# Patient Record
Sex: Male | Born: 1975 | Race: Black or African American | Hispanic: No | Marital: Single | Smoking: Never smoker
Health system: Southern US, Community
[De-identification: ages and names within clinical notes are randomized; demographics above are authoritative.]

## PROBLEM LIST (undated history)

## (undated) DIAGNOSIS — F988 Other specified behavioral and emotional disorders with onset usually occurring in childhood and adolescence: Secondary | ICD-10-CM

## (undated) DIAGNOSIS — C61 Malignant neoplasm of prostate: Secondary | ICD-10-CM

## (undated) DIAGNOSIS — E785 Hyperlipidemia, unspecified: Secondary | ICD-10-CM

## (undated) DIAGNOSIS — F32A Depression, unspecified: Secondary | ICD-10-CM

## (undated) DIAGNOSIS — J45909 Unspecified asthma, uncomplicated: Secondary | ICD-10-CM

## (undated) DIAGNOSIS — F419 Anxiety disorder, unspecified: Secondary | ICD-10-CM

## (undated) DIAGNOSIS — F329 Major depressive disorder, single episode, unspecified: Secondary | ICD-10-CM

## (undated) DIAGNOSIS — J302 Other seasonal allergic rhinitis: Secondary | ICD-10-CM

## (undated) DIAGNOSIS — F259 Schizoaffective disorder, unspecified: Secondary | ICD-10-CM

## (undated) HISTORY — DX: Depression, unspecified: F32.A

## (undated) HISTORY — DX: Hyperlipidemia, unspecified: E78.5

## (undated) HISTORY — DX: Unspecified asthma, uncomplicated: J45.909

## (undated) HISTORY — PX: NO PAST SURGERIES: SHX2092

## (undated) HISTORY — DX: Anxiety disorder, unspecified: F41.9

## (undated) HISTORY — DX: Other seasonal allergic rhinitis: J30.2

## (undated) HISTORY — DX: Major depressive disorder, single episode, unspecified: F32.9

## (undated) HISTORY — DX: Other specified behavioral and emotional disorders with onset usually occurring in childhood and adolescence: F98.8

## (undated) HISTORY — DX: Schizoaffective disorder, unspecified: F25.9

## (undated) HISTORY — DX: Malignant neoplasm of prostate: C61

---

## 2004-04-08 ENCOUNTER — Ambulatory Visit: Payer: Self-pay | Admitting: Internal Medicine

## 2004-05-09 ENCOUNTER — Ambulatory Visit: Payer: Self-pay | Admitting: Internal Medicine

## 2004-07-15 ENCOUNTER — Emergency Department: Payer: Self-pay | Admitting: Emergency Medicine

## 2004-12-06 ENCOUNTER — Ambulatory Visit: Payer: Self-pay | Admitting: Internal Medicine

## 2004-12-20 ENCOUNTER — Ambulatory Visit: Payer: Self-pay | Admitting: Internal Medicine

## 2005-01-07 ENCOUNTER — Ambulatory Visit: Payer: Self-pay | Admitting: Internal Medicine

## 2005-02-28 ENCOUNTER — Ambulatory Visit: Payer: Self-pay | Admitting: Internal Medicine

## 2005-03-09 ENCOUNTER — Ambulatory Visit: Payer: Self-pay | Admitting: Internal Medicine

## 2005-04-08 ENCOUNTER — Ambulatory Visit: Payer: Self-pay | Admitting: Internal Medicine

## 2005-05-09 ENCOUNTER — Ambulatory Visit: Payer: Self-pay | Admitting: Internal Medicine

## 2005-06-09 ENCOUNTER — Ambulatory Visit: Payer: Self-pay | Admitting: Internal Medicine

## 2005-07-07 ENCOUNTER — Ambulatory Visit: Payer: Self-pay | Admitting: Internal Medicine

## 2005-08-07 ENCOUNTER — Ambulatory Visit: Payer: Self-pay | Admitting: Internal Medicine

## 2005-09-06 ENCOUNTER — Ambulatory Visit: Payer: Self-pay | Admitting: Internal Medicine

## 2005-10-07 ENCOUNTER — Ambulatory Visit: Payer: Self-pay | Admitting: Internal Medicine

## 2006-12-09 ENCOUNTER — Emergency Department: Payer: Self-pay

## 2007-11-07 ENCOUNTER — Ambulatory Visit: Payer: Self-pay | Admitting: Internal Medicine

## 2007-11-28 ENCOUNTER — Emergency Department: Payer: Self-pay | Admitting: Emergency Medicine

## 2007-11-30 ENCOUNTER — Ambulatory Visit: Payer: Self-pay | Admitting: Internal Medicine

## 2007-12-08 ENCOUNTER — Ambulatory Visit: Payer: Self-pay | Admitting: Internal Medicine

## 2008-01-25 ENCOUNTER — Ambulatory Visit: Payer: Self-pay | Admitting: Internal Medicine

## 2008-02-07 ENCOUNTER — Ambulatory Visit: Payer: Self-pay | Admitting: Internal Medicine

## 2008-03-09 ENCOUNTER — Ambulatory Visit: Payer: Self-pay | Admitting: Internal Medicine

## 2008-03-21 ENCOUNTER — Ambulatory Visit: Payer: Self-pay | Admitting: Internal Medicine

## 2008-04-08 ENCOUNTER — Ambulatory Visit: Payer: Self-pay | Admitting: Internal Medicine

## 2008-05-15 ENCOUNTER — Ambulatory Visit: Payer: Self-pay | Admitting: Internal Medicine

## 2008-06-09 ENCOUNTER — Ambulatory Visit: Payer: Self-pay | Admitting: Internal Medicine

## 2008-07-07 ENCOUNTER — Ambulatory Visit: Payer: Self-pay | Admitting: Internal Medicine

## 2008-07-11 ENCOUNTER — Ambulatory Visit: Payer: Self-pay | Admitting: Internal Medicine

## 2008-08-07 ENCOUNTER — Ambulatory Visit: Payer: Self-pay | Admitting: Internal Medicine

## 2008-11-06 ENCOUNTER — Ambulatory Visit: Payer: Self-pay | Admitting: Internal Medicine

## 2008-11-18 ENCOUNTER — Ambulatory Visit: Payer: Self-pay | Admitting: Internal Medicine

## 2008-12-07 ENCOUNTER — Ambulatory Visit: Payer: Self-pay | Admitting: Internal Medicine

## 2009-03-17 ENCOUNTER — Ambulatory Visit: Payer: Self-pay | Admitting: Internal Medicine

## 2009-04-08 ENCOUNTER — Ambulatory Visit: Payer: Self-pay | Admitting: Internal Medicine

## 2009-07-07 ENCOUNTER — Ambulatory Visit: Payer: Self-pay | Admitting: Internal Medicine

## 2009-07-14 ENCOUNTER — Ambulatory Visit: Payer: Self-pay | Admitting: Internal Medicine

## 2009-08-07 ENCOUNTER — Ambulatory Visit: Payer: Self-pay | Admitting: Internal Medicine

## 2010-02-11 ENCOUNTER — Emergency Department (HOSPITAL_COMMUNITY): Admission: EM | Admit: 2010-02-11 | Discharge: 2010-02-11 | Payer: Self-pay | Admitting: Emergency Medicine

## 2015-02-03 ENCOUNTER — Encounter: Payer: Self-pay | Admitting: Urology

## 2015-02-03 ENCOUNTER — Ambulatory Visit (INDEPENDENT_AMBULATORY_CARE_PROVIDER_SITE_OTHER): Payer: Medicare Other | Admitting: Urology

## 2015-02-03 VITALS — BP 95/64 | HR 84 | Ht 67.0 in | Wt 127.7 lb

## 2015-02-03 DIAGNOSIS — IMO0002 Reserved for concepts with insufficient information to code with codable children: Secondary | ICD-10-CM

## 2015-02-03 DIAGNOSIS — R32 Unspecified urinary incontinence: Secondary | ICD-10-CM

## 2015-02-03 DIAGNOSIS — N359 Urethral stricture, unspecified: Secondary | ICD-10-CM

## 2015-02-03 LAB — BLADDER SCAN AMB NON-IMAGING: SCAN RESULT: 0

## 2015-02-03 NOTE — Progress Notes (Signed)
02/03/2015 11:59 AM   Shane Gentry 12/08/1975 [id number]  Referring provider: No referring provider defined for this encounter.  Chief Complaint  Patient presents with  . Urinary Incontinence    Referred by Dr. Soyla Dryer    HPI: Patient is a 39 year old African-American male with schizoaffective disorder and mental handicap who presents today referred by his primary care physician, Dr. Soyla Dryer for urinary incontinence.  Patient presents with his caregiver today. She states that she has noticed for the last 2 weeks he's had an increase in his urinary incontinence, dysuria, frequency and nocturia 3-4 times nightly. She believes that he has a urinary tract infection. She denies any cloudy urine, malodorous urine or blood in the urine.  She has not noted any fevers, chills, nausea or vomiting with the patient.  His PVR today is 0 mL.  Patient could not provide a urine specimen at today's visit. His caregiver will return tomorrow with the specimen.  PMH: Past Medical History  Diagnosis Date  . Seasonal allergies   . Anxiety   . Depression   . HLD (hyperlipidemia)   . Prostate cancer   . Schizoaffective disorder   . ADD (attention deficit disorder)   . Asthma     Surgical History: No past surgical history on file.  Home Medications:    Medication List       This list is accurate as of: 02/03/15 11:59 PM.  Always use your most recent med list.               atorvastatin 20 MG tablet  Commonly known as:  LIPITOR  Take 20 mg by mouth daily.     benztropine 1 MG tablet  Commonly known as:  COGENTIN  Take 1 mg by mouth at bedtime.     divalproex 250 MG 24 hr tablet  Commonly known as:  DEPAKOTE ER  Take 2 tablets in the am and 3 tablets at night     guanFACINE 2 MG tablet  Commonly known as:  TENEX  Take 2 mg by mouth 2 (two) times daily.     ketoconazole 2 % cream  Commonly known as:  NIZORAL  Apply topically 2 (two) times daily.     lithium 300 MG tablet    Take 300-600 mg by mouth 2 (two) times daily. Take one tablet in the am and two tablets at bedtime     Loratadine 10 MG Caps  Take 10 mg by mouth daily.     LORazepam 2 MG tablet  Commonly known as:  ATIVAN  Take 2 mg by mouth 2 (two) times daily.     montelukast 10 MG tablet  Commonly known as:  SINGULAIR  Take by mouth at bedtime.     nystatin cream  Commonly known as:  MYCOSTATIN  Apply 1 application topically 2 (two) times daily.     RISPERDAL CONSTA 50 MG injection  Generic drug:  risperiDONE microspheres  Inject 50 mg into the muscle every 14 (fourteen) days.        Allergies:  Allergies  Allergen Reactions  . Sulfa Antibiotics Hives    Family History: No family history on file.  Social History:  reports that he has never smoked. He does not have any smokeless tobacco history on file. He reports that he does not drink alcohol or use illicit drugs.  ROS: UROLOGY Frequent Urination?: Yes Hard to postpone urination?: Yes Burning/pain with urination?: Yes Get up at night to urinate?: Yes Leakage of urine?:  No Urine stream starts and stops?: No Trouble starting stream?: No Do you have to strain to urinate?: No Blood in urine?: No Urinary tract infection?: No Sexually transmitted disease?: No Injury to kidneys or bladder?: No Painful intercourse?: No Weak stream?: No Erection problems?: No Penile pain?: Yes  Gastrointestinal Nausea?: No Vomiting?: No Indigestion/heartburn?: No Diarrhea?: No Constipation?: No  Constitutional Fever: No Night sweats?: No Weight loss?: Yes Fatigue?: No  Skin Skin rash/lesions?: No Itching?: No  Eyes Blurred vision?: No Double vision?: No  Ears/Nose/Throat Sore throat?: No Sinus problems?: No  Hematologic/Lymphatic Swollen glands?: No Easy bruising?: No  Cardiovascular Leg swelling?: No Chest pain?: No  Respiratory Cough?: No Shortness of breath?: No  Endocrine Excessive thirst?:  Yes  Musculoskeletal Back pain?: No Joint pain?: No  Neurological Headaches?: No Dizziness?: No  Psychologic Depression?: No Anxiety?: No  Physical Exam: BP 95/64 mmHg  Pulse 84  Ht 5\' 7"  (1.702 m)  Wt 127 lb 11.2 oz (57.924 kg)  BMI 20.00 kg/m2  Constitutional:  Alert and oriented, No acute distress. HEENT: [place] AT, moist mucus membranes.  Trachea midline, no masses. Cardiovascular: No clubbing, cyanosis, or edema. Respiratory: Normal respiratory effort, no increased work of breathing. GI: Abdomen is soft, nontender, nondistended, no abdominal masses GU: No CVA tenderness. GU: Patient with circumcised phallus.   Urethral meatus with stenosis.  No penile discharge. No penile lesions or rashes. Scrotum without lesions, cysts, rashes and/or edema.  Testicles are located scrotally bilaterally. No masses are appreciated in the testicles. Left and right epididymis are normal. Rectal: Patient with  normal sphincter tone. Perineum without scarring or rashes. No rectal masses are appreciated. Prostate is approximately 50 grams, no nodules are appreciated. Seminal vesicles are normal. Skin: No rashes, bruises or suspicious lesions. Lymph: No cervical or inguinal adenopathy. Neurologic: Grossly intact, no focal deficits, moving all 4 extremities. Psychiatric: Normal mood and affect.  Laboratory Data:  Pertinent Imaging: Results for ERVIE, MCCARD (MRN [id number]) as of 02/06/2015 11:06  Ref. Range 02/03/2015 14:28  Scan Result Unknown 0    Assessment & Plan:    1. Urinary incontinence, unspecified incontinence type:   Patient with a recent worsening of urinary incontinence, dysuria and nocturia. Patient's caregiver will return tomorrow with a urinalysis for further evaluation.  Further management will be pending urinalysis results.  - Urinalysis, Complete - BLADDER SCAN AMB NON-IMAGING  2. Meatal stenosis:   Patient did not have a residual on the bladder scan at today's exam.   Uncertain at this time if the stenosis is plain apart in his urinary symptoms.  We will readdress once we have received the urinalysis results.  Return for patient's caregiver will bring in an UA in the morning.  Zara Council, [facility] [address] [place], [place] [address] [phone]

## 2015-02-04 ENCOUNTER — Encounter (HOSPITAL_COMMUNITY): Payer: Self-pay

## 2015-02-04 ENCOUNTER — Telehealth: Payer: Self-pay

## 2015-02-04 ENCOUNTER — Emergency Department (HOSPITAL_COMMUNITY): Payer: Medicare Other

## 2015-02-04 ENCOUNTER — Other Ambulatory Visit: Payer: Medicaid Other

## 2015-02-04 ENCOUNTER — Emergency Department (HOSPITAL_COMMUNITY)
Admission: EM | Admit: 2015-02-04 | Discharge: 2015-02-04 | Disposition: A | Discharge status: Home / Self Care | Payer: Medicare Other | Attending: Emergency Medicine | Admitting: Emergency Medicine

## 2015-02-04 DIAGNOSIS — E785 Hyperlipidemia, unspecified: Secondary | ICD-10-CM | POA: Insufficient documentation

## 2015-02-04 DIAGNOSIS — K5641 Fecal impaction: Secondary | ICD-10-CM | POA: Diagnosis not present

## 2015-02-04 DIAGNOSIS — J45901 Unspecified asthma with (acute) exacerbation: Secondary | ICD-10-CM | POA: Diagnosis not present

## 2015-02-04 DIAGNOSIS — F259 Schizoaffective disorder, unspecified: Secondary | ICD-10-CM | POA: Insufficient documentation

## 2015-02-04 DIAGNOSIS — F329 Major depressive disorder, single episode, unspecified: Secondary | ICD-10-CM | POA: Diagnosis not present

## 2015-02-04 DIAGNOSIS — Z8546 Personal history of malignant neoplasm of prostate: Secondary | ICD-10-CM | POA: Diagnosis not present

## 2015-02-04 DIAGNOSIS — R0602 Shortness of breath: Secondary | ICD-10-CM | POA: Diagnosis present

## 2015-02-04 DIAGNOSIS — Z79899 Other long term (current) drug therapy: Secondary | ICD-10-CM | POA: Insufficient documentation

## 2015-02-04 DIAGNOSIS — F419 Anxiety disorder, unspecified: Secondary | ICD-10-CM | POA: Diagnosis not present

## 2015-02-04 LAB — CBC WITH DIFFERENTIAL/PLATELET
Basophils Absolute: 0 10*3/uL (ref 0.0–0.1)
Basophils Relative: 0 %
EOS ABS: 0.1 10*3/uL (ref 0.0–0.7)
Eosinophils Relative: 1 %
HEMATOCRIT: 44.4 % (ref 39.0–52.0)
HEMOGLOBIN: 13.5 g/dL (ref 13.0–17.0)
LYMPHS ABS: 1 10*3/uL (ref 0.7–4.0)
Lymphocytes Relative: 12 %
MCH: 29.7 pg (ref 26.0–34.0)
MCHC: 30.4 g/dL (ref 30.0–36.0)
MCV: 97.8 fL (ref 78.0–100.0)
MONO ABS: 1.2 10*3/uL — AB (ref 0.1–1.0)
MONOS PCT: 15 %
NEUTROS ABS: 5.8 10*3/uL (ref 1.7–7.7)
NEUTROS PCT: 72 %
Platelets: 86 10*3/uL — ABNORMAL LOW (ref 150–400)
RBC: 4.54 MIL/uL (ref 4.22–5.81)
RDW: 15.7 % — AB (ref 11.5–15.5)
WBC: 8 10*3/uL (ref 4.0–10.5)

## 2015-02-04 LAB — URINALYSIS, ROUTINE W REFLEX MICROSCOPIC
Bilirubin Urine: NEGATIVE
Glucose, UA: NEGATIVE mg/dL
Hgb urine dipstick: NEGATIVE
Ketones, ur: NEGATIVE mg/dL
NITRITE: NEGATIVE
Protein, ur: NEGATIVE mg/dL
SPECIFIC GRAVITY, URINE: 1.017 (ref 1.005–1.030)
UROBILINOGEN UA: 1 mg/dL (ref 0.0–1.0)
pH: 5.5 (ref 5.0–8.0)

## 2015-02-04 LAB — COMPREHENSIVE METABOLIC PANEL
ALBUMIN: 3.6 g/dL (ref 3.5–5.0)
ALK PHOS: 24 U/L — AB (ref 38–126)
ALT: 27 U/L (ref 17–63)
AST: 27 U/L (ref 15–41)
Anion gap: 6 (ref 5–15)
BUN: 26 mg/dL — ABNORMAL HIGH (ref 6–20)
CHLORIDE: 111 mmol/L (ref 101–111)
CO2: 29 mmol/L (ref 22–32)
CREATININE: 1.24 mg/dL (ref 0.61–1.24)
Calcium: 9.4 mg/dL (ref 8.9–10.3)
GFR calc non Af Amer: >60 mL/min (ref >60)
GLUCOSE: 97 mg/dL (ref 65–99)
Potassium: 3.8 mmol/L (ref 3.5–5.1)
SODIUM: 146 mmol/L — AB (ref 135–145)
Total Bilirubin: 0.7 mg/dL (ref 0.3–1.2)
Total Protein: 7 g/dL (ref 6.5–8.1)

## 2015-02-04 LAB — I-STAT CHEM 8, ED
BUN: 25 mg/dL — ABNORMAL HIGH (ref 6–20)
Calcium, Ion: 1.27 mmol/L — ABNORMAL HIGH (ref 1.12–1.23)
Chloride: 110 mmol/L (ref 101–111)
Creatinine, Ser: 1.3 mg/dL — ABNORMAL HIGH (ref 0.61–1.24)
GLUCOSE: 89 mg/dL (ref 65–99)
HEMATOCRIT: 42 % (ref 39.0–52.0)
HEMOGLOBIN: 14.3 g/dL (ref 13.0–17.0)
POTASSIUM: 3.7 mmol/L (ref 3.5–5.1)
SODIUM: 149 mmol/L — AB (ref 135–145)
TCO2: 26 mmol/L (ref 0–100)

## 2015-02-04 LAB — CBG MONITORING, ED: Glucose-Capillary: 85 mg/dL (ref 65–99)

## 2015-02-04 LAB — LIPASE, BLOOD: LIPASE: 20 U/L — AB (ref 22–51)

## 2015-02-04 LAB — URINE MICROSCOPIC-ADD ON

## 2015-02-04 LAB — OSMOLALITY: OSMOLALITY: 313 mosm/kg — AB (ref 275–300)

## 2015-02-04 LAB — OSMOLALITY, URINE: OSMOLALITY UR: 512 mosm/kg (ref 390–1090)

## 2015-02-04 LAB — I-STAT CG4 LACTIC ACID, ED: Lactic Acid, Venous: 1.08 mmol/L (ref 0.5–2.0)

## 2015-02-04 LAB — SODIUM, URINE, RANDOM: SODIUM UR: 23 mmol/L

## 2015-02-04 LAB — AMMONIA: AMMONIA: 20 umol/L (ref 9–35)

## 2015-02-04 LAB — ETHANOL: Alcohol, Ethyl (B): <5 mg/dL (ref <5)

## 2015-02-04 LAB — CREATININE, URINE, RANDOM: CREATININE, URINE: 113.68 mg/dL

## 2015-02-04 MED ORDER — SODIUM CHLORIDE 0.9 % IV SOLN
1000.0000 mL | Freq: Once | INTRAVENOUS | Status: AC
  Administered 2015-02-04: 1000 mL via INTRAVENOUS

## 2015-02-04 NOTE — ED Notes (Signed)
Patient transported to CT 

## 2015-02-04 NOTE — ED Notes (Signed)
MD at bedside. 

## 2015-02-04 NOTE — ED Provider Notes (Signed)
CSN: [id number]     Arrival date & time 02/04/15  1255 History   First MD Initiated Contact with Patient 02/04/15 1502     Chief Complaint  Patient presents with  . Shortness of Breath     (Consider location/radiation/quality/duration/timing/severity/associated sxs/prior Treatment) Patient is a 39 y.o. male presenting with altered mental status. The history is provided by the patient and the nursing home.  Altered Mental Status Presenting symptoms: behavior changes and confusion   Severity:  Moderate Most recent episode:  More than 2 days ago Episode history:  Continuous Duration:  1 week Timing:  Constant Progression:  Worsening Chronicity:  New Associated symptoms: abdominal pain   Associated symptoms: no fever, no headaches, no palpitations, no rash and no vomiting    39 yo M with a chief complaint of altered mental status. Per the nursing home patient with MR not really talking as much as he normally does. Seems confused at times. Has been complaining of abdominal pain as well as shortness of breath today. They feel like he's had difficulty urinating for the past couple days. Feel like he's had confusion at least for this past week. Has had significant weight loss over the past month. They've seen their doctor more than once this week with a normal workup.  Past Medical History  Diagnosis Date  . Seasonal allergies   . Anxiety   . Depression   . HLD (hyperlipidemia)   . Prostate cancer   . Schizoaffective disorder   . ADD (attention deficit disorder)   . Asthma    History reviewed. No pertinent past surgical history. History reviewed. No pertinent family history. Social History  Substance Use Topics  . Smoking status: Never Smoker   . Smokeless tobacco: None  . Alcohol Use: No    Review of Systems  Constitutional: Negative for fever and chills.  HENT: Negative for congestion and facial swelling.   Eyes: Negative for discharge and visual disturbance.  Respiratory:  Positive for shortness of breath.   Cardiovascular: Negative for chest pain and palpitations.  Gastrointestinal: Positive for abdominal pain. Negative for vomiting and diarrhea.  Musculoskeletal: Negative for myalgias and arthralgias.  Skin: Negative for color change and rash.  Neurological: Positive for tremors. Negative for syncope and headaches.  Psychiatric/Behavioral: Positive for confusion. Negative for dysphoric mood.      Allergies  Sulfa antibiotics  Home Medications   Prior to Admission medications   Medication Sig Start Date End Date Taking? Authorizing Provider  atorvastatin (LIPITOR) 20 MG tablet Take 20 mg by mouth daily.  01/08/15  Yes Historical Provider, MD  benzonatate (TESSALON) 200 MG capsule Take 200 mg by mouth every 6 (six) hours as needed for cough.   Yes Historical Provider, MD  benztropine (COGENTIN) 1 MG tablet Take 1 mg by mouth at bedtime.  01/08/15  Yes Historical Provider, MD  divalproex (DEPAKOTE ER) 500 MG 24 hr tablet Take 1,500 mg by mouth at bedtime.   Yes Historical Provider, MD  guaiFENesin (ROBITUSSIN) 100 MG/5ML liquid Take 200 mg by mouth 4 (four) times daily as needed for cough or congestion.   Yes Historical Provider, MD  guanFACINE (TENEX) 2 MG tablet Take 2 mg by mouth 2 (two) times daily.  01/08/15  Yes Historical Provider, MD  ketoconazole (NIZORAL) 2 % cream Apply topically 2 (two) times daily.  01/21/15  Yes Historical Provider, MD  lithium 300 MG tablet Take 300-600 mg by mouth 2 (two) times daily. Take one tablet in the am  and two tablets at bedtime 01/20/15  Yes Historical Provider, MD  Loratadine 10 MG CAPS Take 10 mg by mouth daily.   Yes Historical Provider, MD  LORazepam (ATIVAN) 2 MG tablet Take 2 mg by mouth 2 (two) times daily.  01/08/15  Yes Historical Provider, MD  montelukast (SINGULAIR) 10 MG tablet Take by mouth at bedtime.  01/08/15  Yes Historical Provider, MD  nystatin cream (MYCOSTATIN) Apply 1 application topically 2 (two) times  daily.  12/15/14  Yes Historical Provider, MD  RISPERDAL CONSTA 50 MG injection Inject 50 mg into the muscle every 14 (fourteen) days.  01/08/15  Yes Historical Provider, MD   BP 120/67 mmHg  Pulse 106  Temp(Src) 97.9 F (36.6 C) (Oral)  Resp 14  SpO2 99% Physical Exam  Constitutional: He appears well-developed and well-nourished.  HENT:  Head: Normocephalic and atraumatic.  Eyes: EOM are normal. Pupils are equal, round, and reactive to light.  Neck: Normal range of motion. Neck supple. No JVD present.  Cardiovascular: Normal rate and regular rhythm.  Exam reveals no gallop and no friction rub.   No murmur heard. Pulmonary/Chest: No respiratory distress. He has no wheezes.  Abdominal: He exhibits no distension. There is no tenderness. There is no rebound and no guarding.  Patient points all over his abdomen when asked where it hurts. benign abdominal exam  Musculoskeletal: Normal range of motion.  Neurological: He is alert.  Patient complains abdomen saying that it hurts periodically. Will not answer direct questions. Nursing home feels that this is different than his normal.  Skin: No rash noted. No pallor.  Psychiatric: He has a normal mood and affect. His behavior is normal.    ED Course  Fecal disimpaction Date/Time: 02/04/2015 11:07 PM Performed by: Tyrone Nine DAN Authorized by: Deno Etienne Consent: Verbal consent obtained. Risks and benefits: risks, benefits and alternatives were discussed Consent given by: patient Required items: required blood products, implants, devices, and special equipment available Patient identity confirmed: verbally with patient Time out: Immediately prior to procedure a "time out" was called to verify the correct patient, procedure, equipment, support staff and site/side marked as required. Local anesthesia used: no Patient sedated: no Patient tolerance: Patient tolerated the procedure well with no immediate complications Comments: Large amount of hard  stool removed.  Brown, no noted bleeding.   (including critical care time) Labs Review Labs Reviewed  COMPREHENSIVE METABOLIC PANEL - Abnormal; Notable for the following:    Sodium 146 (*)    BUN 26 (*)    Alkaline Phosphatase 24 (*)    All other components within normal limits  CBC WITH DIFFERENTIAL/PLATELET - Abnormal; Notable for the following:    RDW 15.7 (*)    Platelets 86 (*)    Monocytes Absolute 1.2 (*)    All other components within normal limits  URINALYSIS, ROUTINE W REFLEX MICROSCOPIC (NOT AT [facility]) - Abnormal; Notable for the following:    APPearance CLOUDY (*)    Leukocytes, UA SMALL (*)    All other components within normal limits  LIPASE, BLOOD - Abnormal; Notable for the following:    Lipase 20 (*)    All other components within normal limits  OSMOLALITY - Abnormal; Notable for the following:    Osmolality 313 (*)    All other components within normal limits  I-STAT CHEM 8, ED - Abnormal; Notable for the following:    Sodium 149 (*)    BUN 25 (*)    Creatinine, Ser 1.30 (*)  Calcium, Ion 1.27 (*)    All other components within normal limits  URINE CULTURE  AMMONIA  ETHANOL  OSMOLALITY, URINE  SODIUM, URINE, RANDOM  CREATININE, URINE, RANDOM  URINE MICROSCOPIC-ADD ON  I-STAT CG4 LACTIC ACID, ED  CBG MONITORING, ED    Imaging Review Dg Chest 2 View  02/04/2015   CLINICAL DATA:  Shortness of breath.  EXAM: CHEST  2 VIEW  COMPARISON:  Report from 10/13/2003 chest radiograph (images not available).  FINDINGS: Normal heart size. Normal mediastinal contour. No pneumothorax. No pleural effusion. Clear lungs, with no focal lung consolidation and no pulmonary edema.  IMPRESSION: No active disease in the chest.   Electronically Signed   By: Ilona Sorrel M.D.   On: 02/04/2015 16:01   Ct Head Wo Contrast  02/04/2015   CLINICAL DATA:  Mental status changes today. Shaking uncontrollably. Increased confusion.  EXAM: CT HEAD WITHOUT CONTRAST  TECHNIQUE: Contiguous  axial images were obtained from the base of the skull through the vertex without intravenous contrast.  COMPARISON:  None available.  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 No acute intracranial findings such as hemispheric infarction an or intracranial hemorrhage. No mass lesions. The brainstem and cerebellum are grossly normal. No extra-axial fluid collections are identified  No acute bony findings. The paranasal sinuses and mastoid air cells are clear. The globes are intact.  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  No acute intracranial findings or mass lesion.   Electronically Signed   By: Marijo Sanes M.D.   On: 02/04/2015 15:44   Ct Abdomen Pelvis W Contrast  02/04/2015   CLINICAL DATA:  Abdominal pain  EXAM: CT ABDOMEN AND PELVIS WITH CONTRAST  TECHNIQUE: Multidetector CT imaging of the abdomen and pelvis was performed using the standard protocol following bolus administration of intravenous contrast.  CONTRAST:  Dose currently unavailable.  Correlate with EMR charting  COMPARISON:  None.  FINDINGS: Lower chest and abdominal wall: Oral contrast in lower esophagus from poor clearance or reflux.  Tiny fatty umbilical hernia  Hepatobiliary: No focal liver abnormality.No evidence of biliary obstruction or stone.  Pancreas: Unremarkable.  Spleen: Unremarkable.  Adrenals/Urinary Tract: Negative adrenals. No hydronephrosis or stone. Thick bladder wall without surrounding inflammatory change. At the apex there is an 8 mm cystic area which may reflect a small urachal remnant; no enhancing bladder mass seen.  Reproductive:  No pathologic finding.  Stomach/Bowel: No obstruction. No appendicitis.Abnormal stool retention, distending multiple colonic segments. Rectal impaction is likely, with distention to 7 cm and mild mesorectal fat congestion.  Vascular/Lymphatic: No acute vascular abnormality. No mass or adenopathy.  Peritoneal: No ascites or pneumoperitoneum.   Musculoskeletal: No acute abnormalities.  IMPRESSION: 1. Marked stool retention with rectal impaction. 2. Bladder wall thickening, correlate with urinalysis. 3. Additional incidental findings noted above.   Electronically Signed   By: Monte Fantasia M.D.   On: 02/04/2015 21:27   I have personally reviewed and evaluated these images and lab results as part of my medical decision-making.   EKG Interpretation None      MDM   Final diagnoses:  Fecal impaction in rectum    39 yo M with a chief complaint of altered mental status. Per nursing home they feel like he's has been confused and has lost about 30 pounds over the past month. Will obtain a altered mental status workup. Obtain an cath. Chest x-ray CT head ammonia CBC CMP lipase. Benign abdominal exam for me though patient unable to the communicate.  Low threshold for CT of abdomen and pelvis.  Patient without any abnormality in his urine. Hypernatremia. Patient appears to be prerenal based on Fena.  Given IV fluids. CT scan of abdomen pelvis concerning for significant stool impaction. Patient was disimpacted manually at bedside. We'll have him take MiraLAX at home  11:08 PM:  I have discussed the diagnosis/risks/treatment options with the patient and family and believe the pt to be eligible for discharge home to follow-up with PCP. We also discussed returning to the ED immediately if new or worsening sx occur. We discussed the sx which are most concerning (e.g., sudden worsening symptoms) that necessitate immediate return. Medications administered to the patient during their visit and any new prescriptions provided to the patient are listed below.  Medications given during this visit Medications  0.9 %  sodium chloride infusion (0 mLs Intravenous Stopped 02/04/15 1736)    Discharge Medication List as of 02/04/2015  9:57 PM       The patient appears reasonably screen and/or stabilized for discharge and I doubt any other medical condition  or other [facility] requiring further screening, evaluation, or treatment in the ED at this time prior to discharge.    Deno Etienne, DO 02/04/15 2308

## 2015-02-04 NOTE — ED Notes (Signed)
Per group home, having shortness of breath starting today.  Increased urination.  Pt has MR and lives in the group home.

## 2015-02-04 NOTE — Discharge Instructions (Signed)
Taking 8 scoops of MiraLAX in 32 ounces of whatever liquid he prefers. Stay near a bathroom. Fecal Impaction A fecal impaction happens when there is a large, firm amount of stool (or feces) that cannot be passed. The impacted stool is usually in the rectum, which is the lowest part of the large bowel. The impacted stool can block the colon and cause significant problems. CAUSES  The longer stool stays in the rectum, the harder it gets. Anything that slows down your bowel movements can lead to fecal impaction, such as:  Constipation. This can be a long-standing (chronic) problem or can happen suddenly (acute).  Painful conditions of the rectum, such as hemorrhoids or anal fissures. The pain of these conditions can make you try to avoid having bowel movements.  Narcotic pain-relieving medicines, such as methadone, morphine, or codeine.  Not drinking enough fluids.  Inactivity and bed rest over long periods of time.  Diseases of the brain or nervous system that damage the nerves controlling the muscles of the intestines. SIGNS AND SYMPTOMS   Lack of normal bowel movements or changes in bowel patterns.  Sense of fullness in the rectum but unable to pass stool.  Pain or cramps in the abdominal area (often after meals).  Thin, watery discharge from the rectum. DIAGNOSIS  Your health care provider may suspect that you have a fecal impaction based on your symptoms and a physical exam. This will include an exam of your rectum. Sometimes X-rays or lab testing may be needed to confirm the diagnosis and to be sure there are no other problems.  TREATMENT   Initially an impaction can be removed manually. Using a gloved finger, your health care provider can remove hard stool from your rectum.  Medicine is sometimes needed. A suppository or enema can be given in the rectum to soften the stool, which can stimulate a bowel movement. Medicines can also be given by mouth (orally).  Though rare, surgery  may be needed if the colon has torn (perforated) due to blockage. HOME CARE INSTRUCTIONS   Develop regular bowel habits. This could include getting in the habit of having a bowel movement after your morning cup of coffee or after eating. Be sure to allow yourself enough time on the toilet.  Maintain a high-fiber diet.  Drink enough fluids to keep your urine clear or pale yellow as directed by your health care provider.  Exercise regularly.  If you begin to get constipated, increase the amount of fiber in your diet. Eat plenty of fruits, vegetables, whole wheat breads, bran, oatmeal, and similar products.  Take natural fiber laxatives or other laxatives only as directed by your health care provider. SEEK MEDICAL CARE IF:   You have ongoing rectal pain.  You require enemas or suppositories more than twice a week.  You have rectal bleeding.  You have continued problems, or you develop abdominal pain.  You have thin, pencil-like stools. SEEK IMMEDIATE MEDICAL CARE IF:  You have black or tarry stools. MAKE SURE YOU:   Understand these instructions.  Will watch your condition.  Will get help right away if you are not doing well or get worse. Document Released: 01/16/2004 Document Revised: 02/13/2013 Document Reviewed: 10/30/2012 [facility] Patient Information 2015 [place], [place]. This information is not intended to replace advice given to you by your health care provider. Make sure you discuss any questions you have with your health care provider.

## 2015-02-04 NOTE — ED Notes (Signed)
Dr. Tyrone Nine manually disimpacted the pt.

## 2015-02-04 NOTE — ED Notes (Signed)
Pt currently in radiology, will collect labs when pt returns.  

## 2015-02-04 NOTE — Telephone Encounter (Signed)
Maybe the ED will be able to get an urine specimen from him.  Thanks for the update.

## 2015-02-04 NOTE — Telephone Encounter (Signed)
Pt caregiver called stating she currently has pt at the ER. Caregiver described pt to be shaking uncontrollably, mental status change, and "just acting different".

## 2015-02-05 LAB — URINE CULTURE: Culture: NO GROWTH

## 2015-02-06 ENCOUNTER — Telehealth: Payer: Self-pay

## 2015-02-06 DIAGNOSIS — R32 Unspecified urinary incontinence: Secondary | ICD-10-CM | POA: Insufficient documentation

## 2015-02-06 DIAGNOSIS — IMO0002 Reserved for concepts with insufficient information to code with codable children: Secondary | ICD-10-CM | POA: Insufficient documentation

## 2015-02-06 LAB — CULTURE, URINE COMPREHENSIVE

## 2015-02-06 NOTE — Telephone Encounter (Signed)
Spoke with pt caregiver in reference to ucx. Caregiver voiced understanding.

## 2015-02-06 NOTE — Telephone Encounter (Signed)
-----   Message from Nori Riis, PA-C sent at 02/06/2015 11:49 AM EDT ----- Please notify the patient's caregiver that his urine culture with [place] was negative.  He will need a return appointment with [place] for further violation for his urinary incontinence.

## 2015-02-09 LAB — URINALYSIS, COMPLETE
BILIRUBIN UA: NEGATIVE
Glucose, UA: NEGATIVE
KETONES UA: NEGATIVE
Leukocytes, UA: NEGATIVE
Nitrite, UA: NEGATIVE
PH UA: 5.5 (ref 5.0–7.5)
PROTEIN UA: NEGATIVE
SPEC GRAV UA: 1.025 (ref 1.005–1.030)
UUROB: 0.2 mg/dL (ref 0.2–1.0)

## 2015-02-09 LAB — MICROSCOPIC EXAMINATION

## 2015-02-18 ENCOUNTER — Emergency Department
Admission: EM | Admit: 2015-02-18 | Discharge: 2015-02-19 | Disposition: A | Discharge status: Other Acute Inpatient Hospital | Payer: Medicare Other | Attending: Emergency Medicine | Admitting: Emergency Medicine

## 2015-02-18 ENCOUNTER — Encounter: Payer: Self-pay | Admitting: Emergency Medicine

## 2015-02-18 ENCOUNTER — Emergency Department: Payer: Medicare Other

## 2015-02-18 DIAGNOSIS — Z79899 Other long term (current) drug therapy: Secondary | ICD-10-CM | POA: Diagnosis not present

## 2015-02-18 DIAGNOSIS — T438X4A Poisoning by other psychotropic drugs, undetermined, initial encounter: Secondary | ICD-10-CM | POA: Diagnosis not present

## 2015-02-18 DIAGNOSIS — F251 Schizoaffective disorder, depressive type: Secondary | ICD-10-CM

## 2015-02-18 DIAGNOSIS — Y9389 Activity, other specified: Secondary | ICD-10-CM | POA: Diagnosis not present

## 2015-02-18 DIAGNOSIS — Z8546 Personal history of malignant neoplasm of prostate: Secondary | ICD-10-CM | POA: Insufficient documentation

## 2015-02-18 DIAGNOSIS — F259 Schizoaffective disorder, unspecified: Secondary | ICD-10-CM | POA: Insufficient documentation

## 2015-02-18 DIAGNOSIS — K5900 Constipation, unspecified: Secondary | ICD-10-CM

## 2015-02-18 DIAGNOSIS — Y9289 Other specified places as the place of occurrence of the external cause: Secondary | ICD-10-CM | POA: Insufficient documentation

## 2015-02-18 DIAGNOSIS — Y998 Other external cause status: Secondary | ICD-10-CM | POA: Diagnosis not present

## 2015-02-18 DIAGNOSIS — T56894A Toxic effect of other metals, undetermined, initial encounter: Secondary | ICD-10-CM

## 2015-02-18 DIAGNOSIS — F509 Eating disorder, unspecified: Secondary | ICD-10-CM | POA: Diagnosis present

## 2015-02-18 DIAGNOSIS — R634 Abnormal weight loss: Secondary | ICD-10-CM | POA: Insufficient documentation

## 2015-02-18 DIAGNOSIS — R7989 Other specified abnormal findings of blood chemistry: Secondary | ICD-10-CM

## 2015-02-18 LAB — COMPREHENSIVE METABOLIC PANEL
ALBUMIN: 3.6 g/dL (ref 3.5–5.0)
ALK PHOS: 26 U/L — AB (ref 38–126)
ALT: 13 U/L — ABNORMAL LOW (ref 17–63)
AST: 13 U/L — AB (ref 15–41)
Anion gap: 7 (ref 5–15)
BILIRUBIN TOTAL: 0.6 mg/dL (ref 0.3–1.2)
BUN: 15 mg/dL (ref 6–20)
CALCIUM: 10.2 mg/dL (ref 8.9–10.3)
CO2: 29 mmol/L (ref 22–32)
Chloride: 109 mmol/L (ref 101–111)
Creatinine, Ser: 1.05 mg/dL (ref 0.61–1.24)
GFR calc Af Amer: >60 mL/min (ref >60)
GFR calc non Af Amer: >60 mL/min (ref >60)
GLUCOSE: 85 mg/dL (ref 65–99)
POTASSIUM: 5 mmol/L (ref 3.5–5.1)
SODIUM: 145 mmol/L (ref 135–145)
TOTAL PROTEIN: 7.2 g/dL (ref 6.5–8.1)

## 2015-02-18 LAB — CBC WITH DIFFERENTIAL/PLATELET
BASOS ABS: 0 10*3/uL (ref 0–0.1)
EOS ABS: 0.1 10*3/uL (ref 0–0.7)
Eosinophils Relative: 1 %
HEMATOCRIT: 42.3 % (ref 40.0–52.0)
HEMOGLOBIN: 13.7 g/dL (ref 13.0–18.0)
Lymphocytes Relative: 22 %
Lymphs Abs: 1.9 10*3/uL (ref 1.0–3.6)
MCH: 30 pg (ref 26.0–34.0)
MCHC: 32.4 g/dL (ref 32.0–36.0)
MCV: 92.5 fL (ref 80.0–100.0)
Monocytes Absolute: 1.1 10*3/uL — ABNORMAL HIGH (ref 0.2–1.0)
Monocytes Relative: 13 %
NEUTROS ABS: 5.6 10*3/uL (ref 1.4–6.5)
Platelets: 81 10*3/uL — ABNORMAL LOW (ref 150–440)
RBC: 4.57 MIL/uL (ref 4.40–5.90)
RDW: 15.2 % — ABNORMAL HIGH (ref 11.5–14.5)
WBC: 8.7 10*3/uL (ref 3.8–10.6)

## 2015-02-18 LAB — TSH: TSH: 0.03 u[IU]/mL — AB (ref 0.350–4.500)

## 2015-02-18 LAB — LIPASE, BLOOD: Lipase: 21 U/L — ABNORMAL LOW (ref 22–51)

## 2015-02-18 LAB — LITHIUM LEVEL: Lithium Lvl: 1.3 mmol/L — ABNORMAL HIGH (ref 0.60–1.20)

## 2015-02-18 LAB — ACETAMINOPHEN LEVEL: Acetaminophen (Tylenol), Serum: <10 ug/mL — ABNORMAL LOW (ref 10–30)

## 2015-02-18 LAB — SALICYLATE LEVEL

## 2015-02-18 MED ORDER — DIVALPROEX SODIUM ER 500 MG PO TB24
1000.0000 mg | ORAL_TABLET | Freq: Every day | ORAL | Status: DC
  Administered 2015-02-18: 1000 mg via ORAL
  Filled 2015-02-18: qty 2

## 2015-02-18 MED ORDER — LORAZEPAM 1 MG PO TABS
1.0000 mg | ORAL_TABLET | Freq: Two times a day (BID) | ORAL | Status: DC
  Administered 2015-02-18: 1 mg via ORAL
  Filled 2015-02-18: qty 1

## 2015-02-18 MED ORDER — SODIUM CHLORIDE 0.9 % IV BOLUS (SEPSIS)
1000.0000 mL | Freq: Once | INTRAVENOUS | Status: AC
  Administered 2015-02-18: 1000 mL via INTRAVENOUS

## 2015-02-18 MED ORDER — IOHEXOL 240 MG/ML SOLN: 25.0000 mL | Freq: Once | INTRAMUSCULAR | Status: DC | PRN

## 2015-02-18 MED ORDER — MONTELUKAST SODIUM 10 MG PO TABS
10.0000 mg | ORAL_TABLET | Freq: Every day | ORAL | Status: DC
  Administered 2015-02-18: 10 mg via ORAL
  Filled 2015-02-18 (×3): qty 1

## 2015-02-18 MED ORDER — LORATADINE 10 MG PO TABS
10.0000 mg | ORAL_TABLET | Freq: Every day | ORAL | Status: DC
  Administered 2015-02-18: 10 mg via ORAL
  Filled 2015-02-18 (×3): qty 1

## 2015-02-18 MED ORDER — ATORVASTATIN CALCIUM 20 MG PO TABS: 20.0000 mg | ORAL_TABLET | Freq: Every day | ORAL | Status: DC

## 2015-02-18 MED ORDER — IOHEXOL 300 MG/ML  SOLN
100.0000 mL | Freq: Once | INTRAMUSCULAR | Status: AC | PRN
  Administered 2015-02-18: 100 mL via INTRAVENOUS

## 2015-02-18 NOTE — ED Notes (Signed)
Resumed care from [facility].  Pt sleeping.

## 2015-02-18 NOTE — ED Notes (Signed)
Patient assigned to appropriate care area. Patient oriented to unit/care area: Informed that, for their safety, care areas are designed for safety and monitored by security cameras at all times; and visiting hours explained to patient. Patient verbalizes understanding, and verbal contract for safety obtained. 

## 2015-02-18 NOTE — ED Notes (Signed)
BEHAVIORAL HEALTH ROUNDING Patient sleeping: No. Patient alert and oriented: no, pt has hx of MR. Behavior appropriate: Yes.  ; If no, describe:  Nutrition and fluids offered: Yes  Toileting and hygiene offered: Yes  Sitter present: yes Law enforcement present: Yes

## 2015-02-18 NOTE — ED Notes (Signed)

## 2015-02-18 NOTE — ED Notes (Signed)
Per caregiver pt has been refusing to eat for the last month, stating that he will only eat out. Caregiver states patient has lost 30 lbs in the last month. Caregiver states that he has been referred out to GI, PCP, and that per the primary care pt may need to be seen for further psychiatric evaluation. Pt has a hx of MR, is difficult to understand at baseline. NAD noted, pt ambulatory on his own at this time.

## 2015-02-18 NOTE — ED Notes (Signed)
Pt requesting something drink at this time. NAD noted. Respirations even and unlabored at this time. Will continue with 15 min safety checks.

## 2015-02-18 NOTE — ED Notes (Signed)
Caregiver reports pt has not been eating well x 1 month. Pt seen at PCP and referred here.

## 2015-02-18 NOTE — ED Notes (Signed)
NAD noted at this time. Pt in bed with eyes open watching TV. Pt calm and cooperative with staff at this time.

## 2015-02-18 NOTE — ED Notes (Signed)
Report given to Amy C., RN

## 2015-02-18 NOTE — ED Notes (Signed)
NAD noted at this time. Pt laying in bed with eyes closed, awakens with mild stimuli. Respirations even and unlabored at this time.

## 2015-02-18 NOTE — ED Notes (Signed)
BEHAVIORAL HEALTH ROUNDING Patient sleeping: No. Patient alert and oriented: no, per caregiver this is pt baseline, pt has hx of MR. Behavior appropriate: Yes.  ; If no, describe:  Nutrition and fluids offered: No and Pt has not been evaluated by EDP. Toileting and hygiene offered: No Sitter present: yes Law enforcement present: No, ODS

## 2015-02-18 NOTE — ED Notes (Signed)
BEHAVIORAL HEALTH ROUNDING Patient sleeping: No. Patient alert and oriented: no, pt has hx of MR.  Behavior appropriate: Yes.  ; If no, describe:  Nutrition and fluids offered: Yes  Toileting and hygiene offered: Yes  Sitter present: yes Law enforcement present: Yes ODS

## 2015-02-18 NOTE — ED Provider Notes (Addendum)
[facility] Emergency Department Provider Note  ____________________________________________   I have reviewed the triage vital signs and the nursing notes.  History Limited by patient status of MR   HISTORY  Chief Complaint Eating Disorder    HPI Shane Gentry is a 39 y.o. male with a history of MR and multiple psychiatric disorders, he may have a remote history of prostate disorder but according to the group home, for last 5 years he has had his prostate testable times with no evidence of problem. The patient is not under chemotherapy or radiation and he himself cannot give a history this is very much limiting in terms of our ability to assess the patient. According to the group home staff he has been not eating for the last month or more, he has greatly decreased by mouth he has never done this before. He only eats when he is eating food outside of the group home at a restaurant for example. The patient has had a significant weight loss. Has even as much as 30 pounds over the last several months. He has not been vomitinghe has had extensive outpatient workup including TSH and GI consult and no acute figure out the problem so, he was sent here to have psychiatric evaluation to see if there is perhaps the reason he is not eating that was psychiatrically mediated. Patient himself has no complaints. He is at his baseline.  Past Medical History  Diagnosis Date  . Seasonal allergies   . Anxiety   . Depression   . HLD (hyperlipidemia)   . Prostate cancer ([place])   . Schizoaffective disorder ([place])   . ADD (attention deficit disorder)   . Asthma     Patient Active Problem List   Diagnosis Date Noted  . Absence of bladder continence 02/06/2015  . Meatal stenosis 02/06/2015    History reviewed. No pertinent past surgical history.  Current Outpatient Rx  Name  Route  Sig  Dispense  Refill  . atorvastatin (LIPITOR) 20 MG tablet   Oral   Take 20 mg by mouth  daily.          . benzonatate (TESSALON) 200 MG capsule   Oral   Take 200 mg by mouth every 6 (six) hours as needed for cough.         . benztropine (COGENTIN) 1 MG tablet   Oral   Take 1 mg by mouth at bedtime.          . divalproex (DEPAKOTE ER) 500 MG 24 hr tablet   Oral   Take 1,500 mg by mouth at bedtime.         Marland Kitchen guaiFENesin (ROBITUSSIN) 100 MG/5ML liquid   Oral   Take 200 mg by mouth 4 (four) times daily as needed for cough or congestion.         Marland Kitchen guanFACINE (TENEX) 2 MG tablet   Oral   Take 2 mg by mouth 2 (two) times daily.          Marland Kitchen ketoconazole (NIZORAL) 2 % cream   Topical   Apply topically 2 (two) times daily.          Marland Kitchen lithium 300 MG tablet   Oral   Take 300-600 mg by mouth 2 (two) times daily. Take one tablet in the am and two tablets at bedtime         . Loratadine 10 MG CAPS   Oral   Take 10 mg by mouth daily.         Marland Kitchen  LORazepam (ATIVAN) 2 MG tablet   Oral   Take 2 mg by mouth 2 (two) times daily.          . montelukast (SINGULAIR) 10 MG tablet   Oral   Take by mouth at bedtime.          Marland Kitchen nystatin cream (MYCOSTATIN)   Topical   Apply 1 application topically 2 (two) times daily.          Marland Kitchen RISPERDAL CONSTA 50 MG injection   Intramuscular   Inject 50 mg into the muscle every 14 (fourteen) days.            Dispense as written.     Allergies Sulfa antibiotics  History reviewed. No pertinent family history.  Social History Social History  Substance Use Topics  . Smoking status: Never Smoker   . Smokeless tobacco: None  . Alcohol Use: No    Review of Systems all per /group home staff cannot fully obtain secondary to patient mental status Constitutional: No fever/chills Cardiovascular: Denies chest pain. Respiratory: Denies shortness of breath. Gastrointestinal:   no vomiting.  No diarrhea.  No constipation. Musculoskeletal: Negative lower extremity swelling Skin: Negative for rash. Neurological:  Negative for headaches, focal weakness or numbness. 10-point ROS otherwise negative.  ____________________________________________   PHYSICAL EXAM:  VITAL SIGNS: ED Triage Vitals  Enc Vitals Group     BP 02/18/15 1044 109/67 mmHg     Pulse Rate 02/18/15 1044 92     Resp 02/18/15 1044 18     Temp 02/18/15 1044 98.6 F (37 C)     Temp src --      SpO2 02/18/15 1044 98 %     Weight --      Height --      Head Cir --      Peak Flow --      Pain Score --      Pain Loc --      Pain Edu? --      Excl. in [place]? --     Constitutional: Alert and oriented to name, severe MR noted, at baseline, will follow commands. In no acute distress. Not quite cachectic. Emaciated Eyes: Conjunctivae are normal. PERRL. EOMI. Head: Atraumatic. Nose: No congestion/rhinnorhea. Mouth/Throat: Mucous membranes are moist.  Oropharynx non-erythematous. Neck: No stridor.   Nontender with no meningismus no thyroid mass noted Cardiovascular: Normal rate, regular rhythm. Grossly normal heart sounds.  Good peripheral circulation. Respiratory: Normal respiratory effort.  No retractions. Lungs CTAB. Gastrointestinal: Soft and nontender. No distention. No guarding no rebound Back:  There is no focal tenderness or step off there is no midline tenderness there are no lesions noted. there is no CVA tenderness Musculoskeletal: No lower extremity tenderness. No joint effusions, no DVT signs strong distal pulses no edema Neurologic:  Normal speech and language. No gross focal neurologic deficits are appreciated.  Skin:  Skin is warm, dry and intact. No rash noted. Psychiatric: Mood and affect are normal. Speech and behavior are normal.  ____________________________________________   LABS (all labs ordered are listed, but only abnormal results are displayed)  Labs Reviewed  TSH  CBC WITH DIFFERENTIAL/PLATELET  COMPREHENSIVE METABOLIC PANEL  URINALYSIS COMPLETEWITH MICROSCOPIC ([facility] ONLY)  LIPASE, BLOOD   ACETAMINOPHEN LEVEL  SALICYLATE LEVEL  LITHIUM LEVEL   ____________________________________________  EKG   ____________________________________________  RADIOLOGY   ____________________________________________   PROCEDURES  Procedure(s) performed: None  Critical Care performed: None  ____________________________________________   INITIAL IMPRESSION / ASSESSMENT AND PLAN /  ED COURSE  Pertinent labs & imaging results that were available during my care of the patient were reviewed by me and considered in my medical decision making (see chart for details).  Patient here for weight loss. Because is a question of prior prostate cancer obtain a CT scan of his abdomen and pelvis. We will check basic blood work and thyroid testing, most of this is already been done. I have discussed with psychiatry and given his history of schizoaffective disorder they will also see the patient. No SI no HI does not require IVC at this time ____________________________________________   ----------------------------------------- 3:42 PM on 02/18/2015 -----------------------------------------   Signed out at the end of my shift ot oncoming physician   FINAL CLINICAL IMPRESSION(S) / ED DIAGNOSES  Final diagnoses:  None     Schuyler Amor, MD 02/18/15 Todd, MD 02/18/15 [phone]

## 2015-02-18 NOTE — ED Notes (Signed)
NAD noted at this time. Pt's caregiver sitting with patient at this time. Respirations even and unlabored at this time. Pt calm and cooperative with staff.

## 2015-02-18 NOTE — ED Notes (Signed)
NAD noted at this time. Pt resting in bed with his eyes closed. Pt requests a soda to drink. Respirations noted to be even and unlabored at this time. Will continue with 15 min safety checks.

## 2015-02-18 NOTE — ED Notes (Signed)
Pt ambulated to the bathroom at this time. NAD noted. Respirations ntoed to be even and unlabored. Will continue with 15 minute safety checks at this time.

## 2015-02-18 NOTE — ED Notes (Signed)
BEHAVIORAL HEALTH ROUNDING Patient sleeping: Yes.   Patient alert and oriented: no, pt has hx of MR. Behavior appropriate: Yes.  ; If no, describe:  Nutrition and fluids offered: Yes  Toileting and hygiene offered: Yes  Sitter present: yes Law enforcement present: Yes ODS

## 2015-02-18 NOTE — ED Notes (Signed)
NAD noted at this time. Pt resting in bed with eyes closed and TV on. Respirations noted to be even and unlabored at this time.

## 2015-02-18 NOTE — ED Notes (Signed)
NAD noted at this time. Pt resting in bed with his eye closed at this time. Respirations are even and unlabored. Will continue with 15 min safety checks at this time.

## 2015-02-18 NOTE — ED Notes (Signed)
NAD noted at this time. Patient able to ambulate to restroom with minimal assistance from staff. Respirations even and unlabored at this time. Will continue with 15 min safety checks.

## 2015-02-18 NOTE — ED Notes (Signed)
BEHAVIORAL HEALTH ROUNDING Patient sleeping: No. Patient alert and oriented: No, pt has a hx of MR.  Behavior appropriate: Yes.  ; If no, describe:  Nutrition and fluids offered: Yes  Toileting and hygiene offered: Yes  Sitter present: yes Law enforcement present: Yes ODS

## 2015-02-18 NOTE — Consult Note (Signed)
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 Referring Physician:  Burlene Arnt Patient Identification: Zylon Creamer MRN:  [id number] Principal Diagnosis: Schizoaffective disorder [facility]) Diagnosis:   Patient Active Problem List   Diagnosis Date Noted  . Schizoaffective disorder ([place]) [F25.9] 02/18/2015  . Elevated lithium level [R79.9] 02/18/2015  . Constipation [K59.00] 02/18/2015  . Abnormal thyroid blood test [R94.6] 02/18/2015  . 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 Meatal stenosis [N35.9] 02/06/2015    Total Time spent with patient: 1 hour  Subjective:   Sahand Gosch is a 39 y.o. male patient admitted with "I'm from [facility]".  HPI:  Information from the patient and the chart. Attempted to interview patient although he is a poor historian. Old chart reviewed. I also conversed with the workers at his group home or able to give me a little information about his baseline and his history. Labs reviewed. Vitals reviewed. Patient was brought into the hospital because he has had several months of weight loss related to refusal to eat. The history sounds a little peculiar. The patient isn't able to tell me much of anything. Apparently he will refuse to eat regular food but on other occasions will gladly eat certain things that are offered to him. Again the patient is unable to describe anything about this. Apparently he's had a workup from gastroenterology without a clear diagnosis. Additionally his mental status has recently been worsening probably over several weeks. He has become more and more confused with disorganized speech, less interactive in the group home and just generally has seemed to not be himself. No reports of suicidal attempts or statements or homicidal activity. Patient is seen by an outpatient  psychiatrist, Dr.Headon. It is not completely clear whether he has had any recent changes to his medicine. Apparently his outpatient psychiatrist and others suggested that he be brought to our hospital for psychiatric treatment because they could not find any other medical cause for his behavior change.  Past psychiatric history: Patient has been living at his current group home for at least 5 years and during that time has not had a psychiatric hospitalization but they know that he has had hospitalizations in the past. He carries a diagnosis of schizoaffective disorder. He is treated with lithium and Depakote and Risperdal injectable. Own whether he has any past history of suicidal or aggressive behavior. Patient is also treated with Tenex 2 mg twice a day and has a diagnosis of ADHD.  Social history: Patient lives in a group home. He does have a guardian who is not a relative. Not clear whether any relatives are involved with him.  Medical history: Patient has had recent medical treatment for severe constipation and has been worked up for his weight loss without any clear conclusion. Judging by his medicines he has allergies but also elevated cholesterol. Don't know of any other particular medical problems. Patient has a history of possible diagnosis of prostate cancer. Evidently received no treatment for this. It is just mentioned in his notes.  Family history: Patient was interviewed about this as were his caregivers and they were unable to provide any information about it at all.  Substance abuse history: Not known whether there was any problem in the past but the patient has had no way to be abusing substances anytime recently.  Past Psychiatric History: Carries a diagnosis of schizoaffective disorder.  It is known that he has had hospitalizations in the past but the details are unknown. Not known whether he has had suicide attempts in the past. Patient is not able to give any useful history. It is  suspected that he has a history of mental retardation.  Risk to Self: Suicidal Ideation: No Suicidal Intent: No Is patient at risk for suicide?: No Suicidal Plan?: No Access to Means: No What has been your use of drugs/alcohol within the last 12 months?: none reported How many times?: 0 Other Self Harm Risks: None reported Triggers for Past Attempts: None known Intentional Self Injurious Behavior: None Risk to Others: Homicidal Ideation: No Thoughts of Harm to Others: No Current Homicidal Intent: No Current Homicidal Plan: No Access to Homicidal Means: No Identified Victim: None reported History of harm to others?: No Assessment of Violence: None Noted Violent Behavior Description: None noted Does patient have access to weapons?: No Criminal Charges Pending?: No Does patient have a court date: No Prior Inpatient Therapy: Prior Inpatient Therapy: No (UTA) Prior Outpatient Therapy: Prior Outpatient Therapy: No (UTA) Does patient have an ACCT team?: Unknown Does patient have Intensive In-House Services?  : Unknown Does patient have [facility] services? : Unknown Does patient have P4CC services?: Unknown  Past Medical History:  Past Medical History  Diagnosis Date  . Seasonal allergies   . Anxiety   . Depression   . HLD (hyperlipidemia)   . Prostate cancer ([place])   . Schizoaffective disorder ([place])   . ADD (attention deficit disorder)   . Asthma    History reviewed. No pertinent past surgical history. Family History: History reviewed. No pertinent family history. Family Psychiatric  History: Patient is not able to give any information on this topic. We will try and find out more Social History:  History  Alcohol Use No     History  Drug Use No    Social History   Social History  . Marital Status: Single    Spouse Name: N/A  . Number of Children: N/A  . Years of Education: N/A   Social History Main Topics  . Smoking status: Never Smoker   . Smokeless tobacco: None   . Alcohol Use: No  . Drug Use: No  . Sexual Activity: Not Asked   Other Topics Concern  . None   Social History Narrative   Additional Social History:    Pain Medications: None Reported Prescriptions: None Reported Over the Counter: None Reported History of alcohol / drug use?: No history of alcohol / drug abuse                     Allergies:   Allergies  Allergen Reactions  . Sulfa Antibiotics Hives    Labs:  Results for orders placed or performed during the hospital encounter of 02/18/15 (from the past 48 hour(s))  TSH     Status: Abnormal   Collection Time: 02/18/15  1:11 PM  Result Value Ref Range   TSH 0.030 (L) 0.350 - 4.500 uIU/mL  CBC with Differential/Platelet     Status: Abnormal   Collection Time: 02/18/15  1:11 PM  Result Value Ref Range   WBC 8.7 3.8 - 10.6 K/uL   RBC 4.57 4.40 - 5.90 MIL/uL   Hemoglobin 13.7 13.0 - 18.0 g/dL   HCT 42.3 40.0 - 52.0 %   MCV 92.5 80.0 - 100.0 fL   MCH 30.0 26.0 - 34.0 pg   MCHC 32.4 32.0 - 36.0 g/dL  RDW 15.2 (H) 11.5 - 14.5 %   Platelets 81 (L) 150 - 440 K/uL   Neutrophils Relative % 64% %   Neutro Abs 5.6 1.4 - 6.5 K/uL   Lymphocytes Relative 22% %   Lymphs Abs 1.9 1.0 - 3.6 K/uL   Monocytes Relative 13% %   Monocytes Absolute 1.1 (H) 0.2 - 1.0 K/uL   Eosinophils Relative 1% %   Eosinophils Absolute 0.1 0 - 0.7 K/uL   Basophils Relative 0% %   Basophils Absolute 0.0 0 - 0.1 K/uL  Comprehensive metabolic panel     Status: Abnormal   Collection Time: 02/18/15  1:11 PM  Result Value Ref Range   Sodium 145 135 - 145 mmol/L   Potassium 5.0 3.5 - 5.1 mmol/L   Chloride 109 101 - 111 mmol/L   CO2 29 22 - 32 mmol/L   Glucose, Bld 85 65 - 99 mg/dL   BUN 15 6 - 20 mg/dL   Creatinine, Ser 1.05 0.61 - 1.24 mg/dL   Calcium 10.2 8.9 - 10.3 mg/dL   Total Protein 7.2 6.5 - 8.1 g/dL   Albumin 3.6 3.5 - 5.0 g/dL   AST 13 (L) 15 - 41 U/L   ALT 13 (L) 17 - 63 U/L   Alkaline Phosphatase 26 (L) 38 - 126 U/L   Total  Bilirubin 0.6 0.3 - 1.2 mg/dL   GFR calc non Af Amer >60 >60 mL/min   GFR calc Af Amer >60 >60 mL/min    Comment: (NOTE) The eGFR has been calculated using the CKD EPI equation. This calculation has not been validated in all clinical situations. eGFR's persistently <60 mL/min signify possible Chronic Kidney Disease.    Anion gap 7 5 - 15  Lipase, blood     Status: Abnormal   Collection Time: 02/18/15  1:11 PM  Result Value Ref Range   Lipase 21 (L) 22 - 51 U/L  Acetaminophen level     Status: Abnormal   Collection Time: 02/18/15  1:11 PM  Result Value Ref Range   Acetaminophen (Tylenol), Serum <10 (L) 10 - 30 ug/mL    Comment:        THERAPEUTIC CONCENTRATIONS VARY SIGNIFICANTLY. A RANGE OF 10-30 ug/mL MAY BE AN EFFECTIVE CONCENTRATION FOR MANY PATIENTS. HOWEVER, SOME ARE BEST TREATED AT CONCENTRATIONS OUTSIDE THIS RANGE. ACETAMINOPHEN CONCENTRATIONS >150 ug/mL AT 4 HOURS AFTER INGESTION AND >50 ug/mL AT 12 HOURS AFTER INGESTION ARE OFTEN ASSOCIATED WITH TOXIC REACTIONS.   Salicylate level     Status: None   Collection Time: 02/18/15  1:11 PM  Result Value Ref Range   Salicylate Lvl <1.7 2.8 - 30.0 mg/dL  Lithium level     Status: Abnormal   Collection Time: 02/18/15  1:11 PM  Result Value Ref Range   Lithium Lvl 1.30 (H) 0.60 - 1.20 mmol/L    Current Facility-Administered Medications  Medication Dose Route Frequency Provider Last Rate Last Dose  . [START ON 02/19/2015] atorvastatin (LIPITOR) tablet 20 mg  20 mg Oral q1800 Gonzella Lex, MD      . divalproex (DEPAKOTE ER) 24 hr tablet 1,000 mg  1,000 mg Oral QHS John T Clapacs, MD      . iohexol (OMNIPAQUE) 240 MG/ML injection 25 mL  25 mL Oral Once PRN Medication Radiologist, MD      . loratadine (CLARITIN) tablet 10 mg  10 mg Oral Daily Gonzella Lex, MD      . LORazepam (ATIVAN) tablet 1 mg  1 mg Oral BID Gonzella Lex, MD      . montelukast (SINGULAIR) tablet 10 mg  10 mg Oral QHS Gonzella Lex, MD        Current Outpatient Prescriptions  Medication Sig Dispense Refill  . atorvastatin (LIPITOR) 20 MG tablet Take 20 mg by mouth daily.     . benzonatate (TESSALON) 200 MG capsule Take 200 mg by mouth every 6 (six) hours as needed for cough.    . benztropine (COGENTIN) 1 MG tablet Take 1 mg by mouth at bedtime.     . divalproex (DEPAKOTE ER) 500 MG 24 hr tablet Take 1,500 mg by mouth at bedtime.    Marland Kitchen guaiFENesin (ROBITUSSIN) 100 MG/5ML liquid Take 200 mg by mouth 4 (four) times daily as needed for cough or congestion.    Marland Kitchen guanFACINE (TENEX) 2 MG tablet Take 2 mg by mouth 2 (two) times daily.     Marland Kitchen ketoconazole (NIZORAL) 2 % cream Apply topically 2 (two) times daily.     Marland Kitchen lithium 300 MG tablet Take 300-600 mg by mouth 2 (two) times daily. Take one tablet in the am and two tablets at bedtime    . Loratadine 10 MG CAPS Take 10 mg by mouth daily.    Marland Kitchen LORazepam (ATIVAN) 2 MG tablet Take 2 mg by mouth 2 (two) times daily.     . montelukast (SINGULAIR) 10 MG tablet Take by mouth at bedtime.     Marland Kitchen nystatin cream (MYCOSTATIN) Apply 1 application topically 2 (two) times daily.     Marland Kitchen RISPERDAL CONSTA 50 MG injection Inject 50 mg into the muscle every 14 (fourteen) days.       Musculoskeletal: Strength & Muscle Tone: decreased Gait & Station: unsteady Patient leans: Backward  Psychiatric Specialty Exam: Review of Systems  Constitutional: Negative.   HENT: Negative.   Eyes: Negative.   Respiratory: Negative.   Cardiovascular: Negative.   Gastrointestinal: Negative.   Musculoskeletal: Negative.   Skin: Negative.   Neurological: Negative.   Psychiatric/Behavioral: Negative for depression, suicidal ideas, hallucinations, memory loss and substance abuse. The patient is not nervous/anxious and does not have insomnia.     Blood pressure 109/67, pulse 92, temperature 98.6 F (37 C), resp. rate 18, SpO2 98 %.There is no weight on file to calculate BMI.  General Appearance: Disheveled  Eye  Sport and exercise psychologist::  Fair  Speech:  Garbled, Slow and Slurred  Volume:  Decreased  Mood:  Dysphoric  Affect:  Flat  Thought Process:  Disorganized  Orientation:  Negative  Thought Content:  Negative  Suicidal Thoughts:  No  Homicidal Thoughts:  No  Memory:  Immediate;   Poor Recent;   Poor Remote;   Poor  Judgement:  Impaired  Insight:  Shallow  Psychomotor Activity:  Decreased  Concentration:  Poor  Recall:  Poor  Fund of Knowledge:Poor  Language: Poor  Akathisia:  No  Handed:  Right  AIMS (if indicated):     Assets:  Housing Social Support  ADL's:  Intact  Cognition: Impaired,  Moderate and Severe  Sleep:      Treatment Plan Summary: Daily contact with patient to assess and evaluate symptoms and progress in treatment, Medication management and Plan This is a 39 year old gentleman who appears to be quite impaired and has a complicated and limited past history. He does have a diagnosis of schizoaffective disorder and mental retardation. He is currently presenting unable to provide much history and with only the information that he has  been eating poorly but irregularly and behaving differently than normal recently. The lab and x-ray workup we have so far suggest no new evidence of stroke or other intracranial lesion. Notable lab studies include that his lithium level was elevated at 1.3, his TSH is very low, he has some unusual liver studies with multiple low liver results and also has a low platelet count. Depakote level was not checked. All in all I think it is probably in the patient's interest to admit him to the psychiatry ward. I did demonstrate that he was able to ambulate on his own although he may still be a falls risk. I am going to hold off on some of his medicine. Hold off on restarting lithium obviously, also ongoing do not give him the Tenax and I'm cutting his Ativan dose in half and also getting rid of the Cogentin all medicines that could contribute to more confusion and mental  status changes. Recheck lithium level tomorrow morning. I am giving him only at thousand milligrams of Depakote instead of his usual 1500 tonight and we will check his level in the morning. Thyroid panel done. 15 minute checks in place. Case discussed with emergency room doctor and psychiatry staff.  Disposition: Recommend psychiatric Inpatient admission when medically cleared.  John Clapacs 02/18/2015 6:50 PM

## 2015-02-18 NOTE — BH Assessment (Signed)
Assessment Note  Shane Gentry is an 39 y.o. male who reported to [facility] ED with his caregiver.  Per the Triage note, the caregiver has been concerned about the Pt's refusal to eat, the Pt is wanting to just eat out. Pt denied SI, HI, A/ V H.  The Pt was overly concerned about breakfast, even though this assessment was taken at 3:00pm. He talked of wanting "2 sausages, 4 eggs, and grits."  Pt cognitive level appears to be significantly below average. During the assessment, it was very difficult to understand the Pt due to his inability to adequately articulate his needs. It did not appear the Pt is having any psychiatric issues at the moment.   Writer spoke to Dr. Burlene Arnt and Psych MD, Dr. Weber Cooks about the patient.  Dr. Burlene Arnt is going to run test on Pt's stomach.    Past Medical History:  Past Medical History  Diagnosis Date  . Seasonal allergies   . Anxiety   . Depression   . HLD (hyperlipidemia)   . Prostate cancer ([place])   . Schizoaffective disorder ([place])   . ADD (attention deficit disorder)   . Asthma     History reviewed. No pertinent past surgical history.  Family History: History reviewed. No pertinent family history.  Social History:  reports that he has never smoked. He does not have any smokeless tobacco history on file. He reports that he does not drink alcohol or use illicit drugs.  Additional Social History:  Alcohol / Drug Use Pain Medications: None Reported Prescriptions: None Reported Over the Counter: None Reported History of alcohol / drug use?: No history of alcohol / drug abuse  CIWA: CIWA-Ar BP: 109/67 mmHg Pulse Rate: 92 COWS:    Allergies:  Allergies  Allergen Reactions  . Sulfa Antibiotics Hives    Home Medications:  (Not in a hospital admission)  OB/GYN Status:  No LMP for male patient.  General Assessment Data Location of Assessment: [facility] ED TTS Assessment: In system Is this a Tele or Face-to-Face Assessment?: Face-to-Face Is this an  Initial Assessment or a Re-assessment for this encounter?: Initial Assessment Marital status: Single Maiden name: n/a Is patient pregnant?: No Pregnancy Status: No Living Arrangements: Alone (Pt has a caretaker) Can pt return to current living arrangement?: Yes Admission Status: Voluntary Is patient capable of signing voluntary admission?: No Referral Source: Self/Family/Friend Insurance type: Medicaid     Crisis Care Plan Living Arrangements: Alone (Pt has a caretaker) Name of Psychiatrist: [place] Name of Therapist: [employer]  Education Status Is patient currently in school?: No Current Grade: 0 Highest grade of school patient has completed: [place] Name of school: [employer]  Risk to self with the past 6 months Suicidal Ideation: No Has patient been a risk to self within the past 6 months prior to admission? : No Suicidal Intent: No Has patient had any suicidal intent within the past 6 months prior to admission? : No Is patient at risk for suicide?: No Suicidal Plan?: No Has patient had any suicidal plan within the past 6 months prior to admission? : No Access to Means: No What has been your use of drugs/alcohol within the last 12 months?: none reported Previous Attempts/Gestures: No How many times?: 0 Other Self Harm Risks: None reported Triggers for Past Attempts: None known Intentional Self Injurious Behavior: None Family Suicide History: Unable to assess Persecutory voices/beliefs?: No Depression: No Substance abuse history and/or treatment for substance abuse?: No Suicide prevention information given to non-admitted patients: Not applicable  Risk to Others within the past 6 months Homicidal Ideation: No Does patient have any lifetime risk of violence toward others beyond the six months prior to admission? : No Thoughts of Harm to Others: No Current Homicidal Intent: No Current Homicidal Plan: No Access to Homicidal Means: No Identified Victim: None reported History of harm to  others?: No Assessment of Violence: None Noted Violent Behavior Description: None noted Does patient have access to weapons?: No Criminal Charges Pending?: No Does patient have a court date: No Is patient on probation?: No  Psychosis Hallucinations: None noted Delusions: None noted  Mental Status Report Appearance/Hygiene: Unremarkable Eye Contact: Fair Motor Activity: Freedom of movement Speech: Slurred Level of Consciousness: Alert Mood: Other (Comment) (Unable to assess) Affect: Unable to Assess Anxiety Level: None Thought Processes: Irrelevant Judgement: Unable to Assess Orientation: Unable to assess Obsessive Compulsive Thoughts/Behaviors: None  Cognitive Functioning Concentration: Poor Memory: Unable to Assess IQ: Below Average Level of Function: Pt appears MR Insight: Fair Impulse Control: Unable to Assess Appetite: Poor Weight Loss: 0 Weight Gain: 0 Sleep: Unable to Assess Total Hours of Sleep:  (UTA) Vegetative Symptoms: None  ADLScreening [facility] Assessment Services) Patient's cognitive ability adequate to safely complete daily activities?: No  Prior Inpatient Therapy Prior Inpatient Therapy: No (UTA)  Prior Outpatient Therapy Prior Outpatient Therapy: No (UTA) Does patient have an ACCT team?: Unknown Does patient have Intensive In-House Services?  : Unknown Does patient have [facility] services? : Unknown Does patient have P4CC services?: Unknown  ADL Screening (condition at time of admission) Patient's cognitive ability adequate to safely complete daily activities?: No       Abuse/Neglect Assessment (Assessment to be complete while patient is alone) Physical Abuse: Denies Verbal Abuse: Denies Sexual Abuse: Denies Exploitation of patient/patient's resources: Denies Self-Neglect: Denies Values / Beliefs Cultural Requests During Hospitalization: None Spiritual Requests During Hospitalization: None        Additional Information 1:1 In Past 12  Months?: No CIRT Risk: No Elopement Risk: No     Disposition:  Disposition Initial Assessment Completed for this Encounter: Yes Disposition of Patient: Referred to Patient referred to:  (Psych MD)  On Site Evaluation by:   Reviewed with Physician:    Shane Gentry 02/18/2015 3:00 PM

## 2015-02-18 NOTE — ED Notes (Signed)
NAD Noted at this time. Pt resting in bed watching TV. Respirations even and unlabored. Will continue with 15 min safety checks at this time.

## 2015-02-18 NOTE — ED Notes (Signed)
BEHAVIORAL HEALTH ROUNDING Patient sleeping: Yes.   Patient alert and oriented: No, pt has a hx of MR. Behavior appropriate: Yes.  ; If no, describe:  Nutrition and fluids offered: Yes  Toileting and hygiene offered: Yes  Sitter present: yes Law enforcement present: Yes ODS

## 2015-02-19 ENCOUNTER — Inpatient Hospital Stay
Admission: RE | Admit: 2015-02-19 | Discharge: 2015-02-24 | DRG: 885 | Disposition: A | Discharge status: Home / Self Care | Payer: Medicare Other | Source: Intra-hospital | Attending: Psychiatry | Admitting: Psychiatry

## 2015-02-19 DIAGNOSIS — E785 Hyperlipidemia, unspecified: Secondary | ICD-10-CM | POA: Diagnosis present

## 2015-02-19 DIAGNOSIS — E0590 Thyrotoxicosis, unspecified without thyrotoxic crisis or storm: Secondary | ICD-10-CM | POA: Diagnosis present

## 2015-02-19 DIAGNOSIS — Z79899 Other long term (current) drug therapy: Secondary | ICD-10-CM | POA: Diagnosis not present

## 2015-02-19 DIAGNOSIS — F419 Anxiety disorder, unspecified: Secondary | ICD-10-CM | POA: Diagnosis present

## 2015-02-19 DIAGNOSIS — J45909 Unspecified asthma, uncomplicated: Secondary | ICD-10-CM | POA: Diagnosis present

## 2015-02-19 DIAGNOSIS — R627 Adult failure to thrive: Secondary | ICD-10-CM | POA: Diagnosis present

## 2015-02-19 DIAGNOSIS — F251 Schizoaffective disorder, depressive type: Principal | ICD-10-CM | POA: Diagnosis present

## 2015-02-19 DIAGNOSIS — F259 Schizoaffective disorder, unspecified: Secondary | ICD-10-CM | POA: Diagnosis not present

## 2015-02-19 DIAGNOSIS — Z8546 Personal history of malignant neoplasm of prostate: Secondary | ICD-10-CM | POA: Diagnosis not present

## 2015-02-19 DIAGNOSIS — R32 Unspecified urinary incontinence: Secondary | ICD-10-CM | POA: Diagnosis present

## 2015-02-19 DIAGNOSIS — F79 Unspecified intellectual disabilities: Secondary | ICD-10-CM | POA: Diagnosis present

## 2015-02-19 DIAGNOSIS — N3944 Nocturnal enuresis: Secondary | ICD-10-CM | POA: Diagnosis present

## 2015-02-19 DIAGNOSIS — Z882 Allergy status to sulfonamides status: Secondary | ICD-10-CM

## 2015-02-19 DIAGNOSIS — K5900 Constipation, unspecified: Secondary | ICD-10-CM | POA: Diagnosis present

## 2015-02-19 LAB — URINALYSIS COMPLETE WITH MICROSCOPIC (ARMC ONLY)
BACTERIA UA: NONE SEEN
BILIRUBIN URINE: NEGATIVE
Bacteria, UA: NONE SEEN
Bilirubin Urine: NEGATIVE
GLUCOSE, UA: NEGATIVE mg/dL
GLUCOSE, UA: NEGATIVE mg/dL
HGB URINE DIPSTICK: NEGATIVE
Hgb urine dipstick: NEGATIVE
KETONES UR: NEGATIVE mg/dL
Ketones, ur: NEGATIVE mg/dL
Leukocytes, UA: NEGATIVE
Leukocytes, UA: NEGATIVE
NITRITE: NEGATIVE
Nitrite: NEGATIVE
PH: 8 (ref 5.0–8.0)
PROTEIN: NEGATIVE mg/dL
Protein, ur: NEGATIVE mg/dL
RBC / HPF: NONE SEEN RBC/hpf (ref 0–5)
RBC / HPF: NONE SEEN RBC/hpf (ref 0–5)
SPECIFIC GRAVITY, URINE: 1.006 (ref 1.005–1.030)
SPECIFIC GRAVITY, URINE: 1.015 (ref 1.005–1.030)
Squamous Epithelial / LPF: NONE SEEN
pH: 6 (ref 5.0–8.0)

## 2015-02-19 LAB — AMMONIA: AMMONIA: 29 umol/L (ref 9–35)

## 2015-02-19 LAB — LITHIUM LEVEL: Lithium Lvl: 0.84 mmol/L (ref 0.60–1.20)

## 2015-02-19 LAB — VALPROIC ACID LEVEL: VALPROIC ACID LVL: 71 ug/mL (ref 50.0–100.0)

## 2015-02-19 MED ORDER — ALUM & MAG HYDROXIDE-SIMETH 200-200-20 MG/5ML PO SUSP: 30.0000 mL | ORAL | Status: DC | PRN

## 2015-02-19 MED ORDER — LITHIUM CARBONATE 300 MG PO CAPS
300.0000 mg | ORAL_CAPSULE | Freq: Two times a day (BID) | ORAL | Status: DC
  Administered 2015-02-19 – 2015-02-24 (×10): 300 mg via ORAL
  Filled 2015-02-19 (×10): qty 1

## 2015-02-19 MED ORDER — MAGNESIUM HYDROXIDE 400 MG/5ML PO SUSP: 30.0000 mL | Freq: Every day | ORAL | Status: DC | PRN

## 2015-02-19 MED ORDER — DIVALPROEX SODIUM ER 500 MG PO TB24
1000.0000 mg | ORAL_TABLET | Freq: Every day | ORAL | Status: DC
  Administered 2015-02-19 – 2015-02-23 (×5): 1000 mg via ORAL
  Filled 2015-02-19 (×5): qty 2

## 2015-02-19 MED ORDER — MONTELUKAST SODIUM 10 MG PO TABS
10.0000 mg | ORAL_TABLET | Freq: Every day | ORAL | Status: DC
  Administered 2015-02-19 – 2015-02-23 (×5): 10 mg via ORAL
  Filled 2015-02-19 (×5): qty 1

## 2015-02-19 MED ORDER — IOHEXOL 240 MG/ML SOLN: 25.0000 mL | Freq: Once | INTRAMUSCULAR | Status: DC | PRN

## 2015-02-19 MED ORDER — POLYETHYLENE GLYCOL 3350 17 G PO PACK
17.0000 g | PACK | Freq: Every day | ORAL | Status: DC
  Administered 2015-02-19 – 2015-02-24 (×5): 17 g via ORAL
  Filled 2015-02-19 (×6): qty 1

## 2015-02-19 MED ORDER — ATORVASTATIN CALCIUM 20 MG PO TABS
20.0000 mg | ORAL_TABLET | Freq: Every day | ORAL | Status: DC
  Administered 2015-02-19 – 2015-02-23 (×5): 20 mg via ORAL
  Filled 2015-02-19 (×5): qty 1

## 2015-02-19 MED ORDER — DOCUSATE SODIUM 100 MG PO CAPS
200.0000 mg | ORAL_CAPSULE | Freq: Two times a day (BID) | ORAL | Status: DC
  Administered 2015-02-19 – 2015-02-24 (×10): 200 mg via ORAL
  Filled 2015-02-19 (×12): qty 2

## 2015-02-19 MED ORDER — ENSURE ENLIVE PO LIQD
237.0000 mL | Freq: Two times a day (BID) | ORAL | Status: DC
  Administered 2015-02-19: 237 mL via ORAL

## 2015-02-19 MED ORDER — LORAZEPAM 1 MG PO TABS
1.0000 mg | ORAL_TABLET | Freq: Two times a day (BID) | ORAL | Status: DC
  Administered 2015-02-19 – 2015-02-23 (×8): 1 mg via ORAL
  Filled 2015-02-19 (×9): qty 1

## 2015-02-19 MED ORDER — MAGNESIUM CITRATE PO SOLN
1.0000 | Freq: Once | ORAL | Status: AC
  Administered 2015-02-19: 1 via ORAL
  Filled 2015-02-19: qty 296

## 2015-02-19 MED ORDER — LITHIUM CARBONATE 300 MG PO CAPS: 300.0000 mg | ORAL_CAPSULE | Freq: Three times a day (TID) | ORAL | Status: DC

## 2015-02-19 MED ORDER — ACETAMINOPHEN 325 MG PO TABS
650.0000 mg | ORAL_TABLET | Freq: Four times a day (QID) | ORAL | Status: DC | PRN
  Administered 2015-02-21 – 2015-02-24 (×2): 650 mg via ORAL
  Filled 2015-02-19 (×2): qty 2

## 2015-02-19 MED ORDER — LORATADINE 10 MG PO TABS
10.0000 mg | ORAL_TABLET | Freq: Every day | ORAL | Status: DC
  Administered 2015-02-19 – 2015-02-24 (×5): 10 mg via ORAL
  Filled 2015-02-19 (×6): qty 1

## 2015-02-19 NOTE — ED Notes (Signed)
Pt. To be discharged from ED and admitted downstairs to Behavior med unit.

## 2015-02-19 NOTE — Progress Notes (Signed)
Patient with depressed affect, cooperative behavior with assistance of staff for transfers from bed to wheelchair. Remains on falls protocol for unsteady gait. Aware how to ring call bell. Instructed that staff will help him transfer to wheelchair.  No SI/HI at this time. Good adls, fair appetite. Taking fluids well.  Requests bananas, orange juice and lemonade. Assistance with menu and requests salads and ranch dressing. Minimal interaction with peers. Talkative with staff. Does well with support and encouragement. MD into visit. Safety maintained.

## 2015-02-19 NOTE — ED Notes (Signed)
BEHAVIORAL HEALTH ROUNDING Patient sleeping: Yes.   Patient alert and oriented: yes Behavior appropriate: Yes.  ; If no, describe:  Nutrition and fluids offered: Yes  Toileting and hygiene offered: Yes  Sitter present: yes Law enforcement present: Yes  

## 2015-02-19 NOTE — H&P (Signed)
Psychiatric Admission Assessment Adult  Patient Identification: Shane Gentry MRN:  [id number] Date of Evaluation:  02/19/2015 Chief Complaint:  schizoaffective Principal Diagnosis: Schizoaffective disorder, depressive type (Shane Gentry) Diagnosis:   Patient Active Problem List   Diagnosis Date Noted  . Failure to thrive in adult [R62.7] 02/19/2015  . Schizoaffective disorder, depressive type ([place]) [F25.1] 02/18/2015  . Elevated lithium level [R79.9] 02/18/2015  . Constipation [K59.00] 02/18/2015  . Abnormal thyroid blood test [R94.6] 02/18/2015  . 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 Meatal stenosis [N35.9] 02/06/2015   History of Present Illness:  Identifying data. Shane Gentry is a 39 year old male with a history of schizoaffective disorder and mental retardation.  Chief complaint. The patient is unable to state.  History of present illness. Information was obtained from the chart and the patient. He is a very poor historian. Reportedly the patient has a long history of schizoaffective disorder. He has been maintained on a combination of lithium, Depakote, and Risperdal Consta history primary provider Dr. Rosine Door. The patient has not been hospitalized for the last 5 years at least." So ago the patient started refusing regular food at his abdomen. That he eats out all the time. Poor oral intake to 30 pound weight loss. This was investigated by his primary care provider, gastroenterologist, and emergency room doctors. No abnormalities were found in his labs or CT scans. The patient has a history of constipation. He was contacted few days ago. There are still massive amounts of stool in his abdomen recent CT scan. The patient does not have anything to report except that he likes it here and wants to stay. The patient apparently has a very good relationship with caretakers. At present before the group home stay with the patient in the emergency room which is appropriate but uncommon.  Today he refused to talk to the group home owner who tried to call him on the phone. The patient denies any symptoms of depression, anxiety, or psychosis. There are no symptoms suggestive of bipolar mania. He does not use alcohol or drugs. He has been compliant with his treatment as indicated by therapeutic level of Depakote and elevated level of lithium.  Past psychiatric history. We know from the group home's that the patient has not been hospitalized past 5 years but that there were hospitalizations prior to his admission to the group home. We do not know his history of suicide. We do not know his history of medication trials.   Family psychiatric history. Unknown.  Social history. At the patient is disabled most likely from his mental illness and cognitive impairment. He has been in his current group home for 5 years. He has Medicaid. He is an incompetent adult. We do not know his Moment.  Total Time spent with patient: 1 hour  Past Psychiatric History:   Risk to Self: Is patient at risk for suicide?: Yes Risk to Others:   Prior Inpatient Therapy:   Prior Outpatient Therapy:    Alcohol Screening: 1. How often do you have a drink containing alcohol?: Never 9. Have you or someone else been injured as a result of your drinking?: No 10. Has a relative or friend or a doctor or another health worker been concerned about your drinking or suggested you cut down?: No Alcohol Use Disorder Identification Test Final Score (AUDIT): 0 Brief Intervention: AUDIT score less than 7 or less-screening does not suggest unhealthy drinking-brief intervention not indicated Substance Abuse History in the last 12 months:  No. Consequences of Substance  Abuse: NA Previous Psychotropic Medications: Yes  Psychological Evaluations: No  Past Medical History:  Past Medical History  Diagnosis Date  . Seasonal allergies   . Anxiety   . Depression   . HLD (hyperlipidemia)   . Prostate cancer ([place])   .  Schizoaffective disorder ([place])   . ADD (attention deficit disorder)   . Asthma    History reviewed. No pertinent past surgical history. Family History: History reviewed. No pertinent family history. Family Psychiatric  History:  Social History:  History  Alcohol Use No     History  Drug Use No    Social History   Social History  . Marital Status: Single    Spouse Name: N/A  . Number of Children: N/A  . Years of Education: N/A   Social History Main Topics  . Smoking status: Never Smoker   . Smokeless tobacco: Never Used  . Alcohol Use: No  . Drug Use: No  . Sexual Activity: Not Currently   Other Topics Concern  . None   Social History Narrative   Additional Social History:    History of alcohol / drug use?: No history of alcohol / drug abuse                    Allergies:   Allergies  Allergen Reactions  . Sulfa Antibiotics Hives   Lab Results:  Results for orders placed or performed during the hospital encounter of 02/19/15 (from the past 48 hour(s))  Lithium level     Status: None   Collection Time: 02/19/15  7:20 AM  Result Value Ref Range   Lithium Lvl 0.84 0.60 - 1.20 mmol/L  Valproic acid level     Status: None   Collection Time: 02/19/15  7:20 AM  Result Value Ref Range   Valproic Acid Lvl 71 50.0 - 161.0 ug/mL    Metabolic Disorder Labs:  No results found for: HGBA1C, MPG No results found for: PROLACTIN No results found for: CHOL, TRIG, HDL, CHOLHDL, VLDL, LDLCALC  Current Medications: Current Facility-Administered Medications  Medication Dose Route Frequency Provider Last Rate Last Dose  . acetaminophen (TYLENOL) tablet 650 mg  650 mg Oral Q6H PRN Gonzella Lex, MD      . alum & mag hydroxide-simeth (MAALOX/MYLANTA) 200-200-20 MG/5ML suspension 30 mL  30 mL Oral Q4H PRN Gonzella Lex, MD      . atorvastatin (LIPITOR) tablet 20 mg  20 mg Oral q1800 Gonzella Lex, MD      . divalproex (DEPAKOTE ER) 24 hr tablet 1,000 mg  1,000 mg Oral  QHS John T Clapacs, MD      . docusate sodium (COLACE) capsule 200 mg  200 mg Oral BID Chemeka Filice B Irbin Fines, MD      . feeding supplement (ENSURE ENLIVE) (ENSURE ENLIVE) liquid 237 mL  237 mL Oral BID BM Taniela Feltus B Nhyla Nappi, MD      . iohexol (OMNIPAQUE) 240 MG/ML injection 25 mL  25 mL Oral Once PRN Gonzella Lex, MD      . lithium carbonate capsule 300 mg  300 mg Oral BID WC Julisa Flippo B Delorus Langwell, MD      . loratadine (CLARITIN) tablet 10 mg  10 mg Oral Daily Gonzella Lex, MD   10 mg at 02/19/15 0830  . LORazepam (ATIVAN) tablet 1 mg  1 mg Oral BID Gonzella Lex, MD   1 mg at 02/19/15 0830  . magnesium citrate solution 1 Bottle  1  Bottle Oral Once Nayel Purdy B Zedrick Springsteen, MD      . magnesium hydroxide (MILK OF MAGNESIA) suspension 30 mL  30 mL Oral Daily PRN Gonzella Lex, MD      . montelukast (SINGULAIR) tablet 10 mg  10 mg Oral QHS John T Clapacs, MD      . polyethylene glycol (MIRALAX / GLYCOLAX) packet 17 g  17 g Oral Daily Naveh Rickles B Cherae Marton, MD       PTA Medications: Prescriptions prior to admission  Medication Sig Dispense Refill Last Dose  . divalproex (DEPAKOTE) 500 MG DR tablet Take 1,500 mg by mouth at bedtime.   02/18/2015 at Unknown time  . guanFACINE (TENEX) 2 MG tablet Take 2 mg by mouth 2 (two) times daily.   02/18/2015 at Unknown time  . lithium 600 MG capsule Take 600 mg by mouth at bedtime.   02/18/2015 at Unknown time  . lithium carbonate 300 MG capsule Take 300 mg by mouth every morning.   02/18/2015 at Unknown time  . LORazepam (ATIVAN) 2 MG tablet Take 2 mg by mouth 2 (two) times daily.   02/18/2015 at Unknown time  . risperiDONE microspheres (RISPERDAL CONSTA) 50 MG injection Inject 50 mg into the muscle every 14 (fourteen) days.       Musculoskeletal: Strength & Muscle Tone: decreased Gait & Station: unsteady Patient leans: N/A  Psychiatric Specialty Exam: Physical Exam  Nursing note and vitals reviewed.   Review of Systems  Constitutional: Positive  for weight loss.  Gastrointestinal: Positive for constipation.  All other systems reviewed and are negative.   Blood pressure 146/119, pulse 126, temperature 98.4 F (36.9 C), temperature source Oral, resp. rate 16, height 5\' 7"  (1.702 m), weight 56.7 kg (125 lb), SpO2 99 %.Body mass index is 19.57 kg/(m^2).  See SRA.                                                  Sleep:        Treatment Plan Summary: Daily contact with patient to assess and evaluate symptoms and progress in treatment and Medication management   Mr. Abboud is a 39 year old male with a history of schizoaffective disorder and mental retardation admitting for refusal to eat with 30 pound weight loss for which no medical reason was found.  1. Failure to thrive. The patient ate only 15% of his breakfast but overall she likes it here and wants to stay here forever. Reportedly he was refusing food at the group home insisting on eating only takeout's.  2. Mood and psychosis. The patient has been maintained on a combination of lithium, Depakote, Risperdal Consta. Lithium level was elevated on admission. It is within therapeutic range today. Will restart lithium at the lower dose. We'll check ammonia level as there were reports of confusion.  3. Constipation. The patient was impacted few days ago. Abdominal x-ray indicates massive amounts of stool. Will start bowel regimen and gave magnesium citrate.  4. Dyslipidemia. The patient is on Lipitor.  5. Anxiety. We'll continue Ativan  6. Disposition. He'll be discharged to home. He will follow up with Dr. Rosine Door.  Observation Level/Precautions:  15 minute checks  Laboratory:  CBC Chemistry Profile UDS UA Hemoglobin A1c, lipid panel, Thyroid function tests.   Psychotherapy:    Medications:    Consultations:    Discharge Concerns:  Estimated LOS:  Other:     I certify that inpatient services furnished can reasonably be expected to improve the  patient's condition.   Maybelle Depaoli 10/13/20162:03 PM

## 2015-02-19 NOTE — Tx Team (Addendum)
Initial Interdisciplinary Treatment Plan   PATIENT STRESSORS: Communicates well  Financial difficulties   PATIENT STRENGTHS: Motivation for treatment/growth   PROBLEM LIST: Problem List/Patient Goals Date to be addressed Date deferred Reason deferred Estimated date of resolution  depression 10/13     anxiety 10/13                                                DISCHARGE CRITERIA:  Need for constant or close observation no longer present  PRELIMINARY DISCHARGE PLAN: Return to previous living arrangement  PATIENT/FAMIILY INVOLVEMENT: This treatment plan has been presented to and reviewed with the patient, Shane Gentry, and/or family membe.  The patient and family have been given the opportunity to ask questions and make suggestions.  Aleen Campi 02/19/2015, 6:45 AM

## 2015-02-19 NOTE — BHH Suicide Risk Assessment (Signed)
[facility] Admission Suicide Risk Assessment   Nursing information obtained from:    Demographic factors:    Current Mental Status:    Loss Factors:    Historical Factors:    Risk Reduction Factors:    Total Time spent with patient: 1 hour Principal Problem: Schizoaffective disorder, depressive type ([place]) Diagnosis:   Patient Active Problem List   Diagnosis Date Noted  . Failure to thrive in adult [R62.7] 02/19/2015  . Schizoaffective disorder, depressive type ([place]) [F25.1] 02/18/2015  . Elevated lithium level [R79.9] 02/18/2015  . Constipation [K59.00] 02/18/2015  . Abnormal thyroid blood test [R94.6] 02/18/2015  . 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 Meatal stenosis [N35.9] 02/06/2015     Continued Clinical Symptoms:  Alcohol Use Disorder Identification Test Final Score (AUDIT): 0 The "Alcohol Use Disorders Identification Test", Guidelines for Use in Primary Care, Second Edition.  World Pharmacologist [facility]). Score between 0-7:  no or low risk or alcohol related problems. Score between 8-15:  moderate risk of alcohol related problems. Score between 16-19:  high risk of alcohol related problems. Score 20 or above:  warrants further diagnostic evaluation for alcohol dependence and treatment.   CLINICAL FACTORS:   Schizophrenia:   Depressive state Paranoid or undifferentiated type   Musculoskeletal: Strength & Muscle Tone: decreased Gait & Station: unsteady Patient leans: N/A  Psychiatric Specialty Exam: Physical Exam  Nursing note and vitals reviewed.   Review of Systems  Constitutional: Positive for weight loss.  Gastrointestinal: Positive for constipation.  All other systems reviewed and are negative.   Blood pressure 146/119, pulse 126, temperature 98.4 F (36.9 C), temperature source Oral, resp. rate 16, height 5\' 7"  (1.702 m), weight 56.7 kg (125 lb), SpO2 99 %.Body mass index is 19.57 kg/(m^2).  General Appearance: Casual  Eye Contact::  Good   Speech:  Slurred  Volume:  Increased  Mood:  Anxious and Irritable  Affect:  Labile  Thought Process:  Goal Directed  Orientation:  Full (Time, Place, and Person)  Thought Content:  Delusions and Paranoid Ideation  Suicidal Thoughts:  No  Homicidal Thoughts:  No  Memory:  Immediate;   Poor Recent;   Poor Remote;   Poor  Judgement:  Poor  Insight:  Lacking  Psychomotor Activity:  Decreased  Concentration:  Poor  Recall:  Poor  Fund of Knowledge:Poor  Language: Poor  Akathisia:  No  Handed:  Right  AIMS (if indicated):     Assets:  Communication Skills Desire for Improvement Financial Resources/Insurance Housing  Sleep:     Cognition: WNL  ADL's:  Intact     COGNITIVE FEATURES THAT CONTRIBUTE TO RISK:  None    SUICIDE RISK:   Mild:  Suicidal ideation of limited frequency, intensity, duration, and specificity.  There are no identifiable plans, no associated intent, mild dysphoria and related symptoms, good self-control (both objective and subjective assessment), few other risk factors, and identifiable protective factors, including available and accessible social support.  PLAN OF CARE: Hospital admission, medication management, discharge planning.  Medical Decision Making:  New problem, with additional work up planned, Review of Psycho-Social Stressors (1), Review or order clinical lab tests (1), Review of Medication Regimen & Side Effects (2) and Review of New Medication or Change in Dosage (2)   Mr. Gowdy is a 39 year old male with a history of schizoaffective disorder and mental retardation admitting for refusal to eat with 30 pound weight loss for which no medical reason was found.  1. Failure to thrive.  The patient ate only 15% of his breakfast but overall she likes it here and wants to stay here forever. Reportedly he was refusing food at the group home insisting on eating only takeout's.  2. Mood and psychosis. The patient has been maintained on a combination of  lithium, Depakote, Risperdal Consta. Lithium level was elevated on admission. It is within therapeutic range today. Will restart lithium at the lower dose. We'll check ammonia level as there were reports of confusion.  3. Constipation. The patient was impacted few days ago. Abdominal x-ray indicates massive amounts of stool. Will start bowel regimen and gave magnesium citrate.  4. Dyslipidemia. The patient is on Lipitor.  5. Anxiety. We'll continue Ativan  6. Disposition. He'll be discharged to home. He will follow up with Dr. Rosine Door.  I certify that inpatient services furnished can reasonably be expected to improve the patient's condition.   Shaquavia Whisonant 02/19/2015, 1:27 PM

## 2015-02-19 NOTE — Progress Notes (Addendum)
Urine sample obtained with results pending. Patient has 1 large BM at this time.

## 2015-02-19 NOTE — Progress Notes (Signed)
Patient 39 year old male admitted to the unit from the emergency department Pt was brought into the hospital because he had several months of weight loss related to refusal to eat per documentation. Pt is a poor historian. Pt is confused with disorganized speech. Pt requires assist with ADL's. Unsteady gait. Dx schizoaffective d/o, MR, ADD, Depression and Anxiety. Pt is anxious but cooperative. Denies SI/HI/AV/H. Patient was searched for contraband non found, skin check done with another nurse; skin warm, dry and intact. No c/o pain/discomfort noted.  

## 2015-02-19 NOTE — BHH Group Notes (Signed)
[facility] Group Notes:  (Nursing/MHT/Case Management/Adjunct)  Date:  02/19/2015  Time:  2:33 PM  Type of Therapy:  Group Therapy  Participation Level:  Did Not Attend  Participation Quality Summary of Progress/Problems:  Shane Gentry 02/19/2015, 2:33 PM

## 2015-02-19 NOTE — Progress Notes (Signed)
D: Pt denies SI/HI/AVH. Pt is cognitively delayed, and also noted to have some confusion to time ad situation. Patient appears anxious and apprehensive at times, frequent redirection used for safety.   A: Pt was offered support and encouragement. Pt was given scheduled medications. Pt was encouraged to attend groups. Q 15 minute checks were done for safety.  R: Pt did not attend evening groups. Pt is taking medication. Pt has no complaints.Pt receptive to treatment and safety maintained on unit.

## 2015-02-19 NOTE — Progress Notes (Signed)
Recreation Therapy Notes  Date: 10.13.16 Time: 3:00 pm Location: Craft Room  Group Topic: Leisure Education   Goal Area(s) Addresses:  Patient will identify activities for each letter of the alphabet. Patient will verbalize ability to integrate positive leisure into life post d/c. 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  Behavioral Response: Did not attend   Intervention: Leisure Alphabet  Activity: Patients were given a leisure alphabet worksheet and instructed to write a healthy leisure activity for each letter of the alphabet.  Education: LRT educated patients on what they needed to participate in leisure.  Education Outcome: Patient did not attend group.   Clinical Observations/Feedback: Patient did not attend group.  Leonette Monarch, LRT/CTRS 02/19/2015 4:40 PM

## 2015-02-19 NOTE — Progress Notes (Signed)
Patient ID: Shane Gentry, male   DOB: 13-Jan-1976, 39 y.o.   MRN: [id number] PER STATE REGULATIONS 482.30  THIS CHART WAS REVIEWED FOR MEDICAL NECESSITY WITH RESPECT TO THE PATIENT'S ADMISSION/ DURATION OF STAY.  NEXT REVIEW DATE:  02/23/2015   Chauncy Lean, RN, BSN CASE MANAGER

## 2015-02-19 NOTE — ED Notes (Signed)
Pt awake.  Urine sample to lab.   Iv in place.

## 2015-02-19 NOTE — ED Notes (Signed)
Pt sleeping. 

## 2015-02-20 LAB — THYROID PANEL
FREE THYROXINE INDEX: 4.9 (ref 1.2–4.9)
T3 UPTAKE RATIO: 40 % — AB (ref 24–39)
T4 TOTAL: 12.2 ug/dL — AB (ref 4.5–12.0)

## 2015-02-20 LAB — THYROID PANEL WITH TSH
FREE THYROXINE INDEX: 5.5 — AB (ref 1.2–4.9)
T3 Uptake Ratio: 39 % (ref 24–39)
T4, Total: 14.2 ug/dL — ABNORMAL HIGH (ref 4.5–12.0)
TSH: 0.009 u[IU]/mL — AB (ref 0.450–4.500)

## 2015-02-20 LAB — T4, FREE: FREE T4: 2.65 ng/dL — AB (ref 0.61–1.12)

## 2015-02-20 MED ORDER — METHIMAZOLE 10 MG PO TABS
20.0000 mg | ORAL_TABLET | Freq: Every day | ORAL | Status: DC
  Filled 2015-02-20: qty 2

## 2015-02-20 MED ORDER — TEMAZEPAM 15 MG PO CAPS
15.0000 mg | ORAL_CAPSULE | Freq: Every day | ORAL | Status: DC
  Administered 2015-02-20 – 2015-02-22 (×3): 15 mg via ORAL
  Filled 2015-02-20 (×3): qty 1

## 2015-02-20 MED ORDER — ENSURE ENLIVE PO LIQD
237.0000 mL | Freq: Three times a day (TID) | ORAL | Status: DC
  Administered 2015-02-20 – 2015-02-21 (×2): 237 mL via ORAL

## 2015-02-20 NOTE — Progress Notes (Signed)
D.Patient bright and ready for breakfast this morning.  Patient c/o stomach ache before snack time, Dr. made aware. Patient is noncompliant with medications and states he does not like them. Socializes with peers.  A. Patient instructed on importance of medications. Q 15 min checks maintained during shift for safety.Will continue to monitor. R. Patient resting in room currently.

## 2015-02-20 NOTE — Progress Notes (Signed)
[facility] MD Progress Note  02/20/2015 8:13 PM Shane Gentry  MRN:  [id number]  Subjective:  Shane Gentry has a long history of schizophrenia in the ontext of developmental disability. He was admitted for failure to thrive when medicalevaluation turned out negative. He does however have abnormal thyroid tests. Dr. Joycie Peek help is greatly ppreciated.  Shane Gentry has no complaints. He is enthusiastic about his hospitalization and want to stay here. There are no problems with oral intake since admission. He drinks a lot of fluids. He however reused medications today. He has no complaints. He has been constipated but refused laxatives. He is unable to participate in group due to cognitive difficulties.  Principal Problem: Schizoaffective disorder, depressive type ([place]) Diagnosis:   Patient Active Problem List   Diagnosis Date Noted  . Failure to thrive in adult [R62.7] 02/19/2015  . Schizoaffective disorder, depressive type ([place]) [F25.1] 02/18/2015  . Elevated lithium level [R79.9] 02/18/2015  . Constipation [K59.00] 02/18/2015  . Abnormal thyroid blood test [R94.6] 02/18/2015  . 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 Meatal stenosis [N35.9] 02/06/2015   Total Time spent with patient: 20 minutes  Past Psychiatric History: Schizophrnia and MR.  Past Medical History:  Past Medical History  Diagnosis Date  . Seasonal allergies   . Anxiety   . Depression   . HLD (hyperlipidemia)   . Prostate cancer ([place])   . Schizoaffective disorder ([place])   . ADD (attention deficit disorder)   . Asthma    History reviewed. No pertinent past surgical history. Family History: History reviewed. No pertinent family history. Family Psychiatric  History: Unknown. Social History:  History  Alcohol Use No     History  Drug Use No    Social History   Social History  . Marital Status: Single    Spouse Name: N/A  . Number of Children: N/A  . Years of Education: N/A   Social History Main  Topics  . Smoking status: Never Smoker   . Smokeless tobacco: Never Used  . Alcohol Use: No  . Drug Use: No  . Sexual Activity: Not Currently   Other Topics Concern  . None   Social History Narrative   Additional Social History:    History of alcohol / drug use?: No history of alcohol / drug abuse                    Sleep: Poor  Appetite:  Fair  Current Medications: Current Facility-Administered Medications  Medication Dose Route Frequency Provider Last Rate Last Dose  . acetaminophen (TYLENOL) tablet 650 mg  650 mg Oral Q6H PRN Gonzella Lex, MD      . alum & mag hydroxide-simeth (MAALOX/MYLANTA) 200-200-20 MG/5ML suspension 30 mL  30 mL Oral Q4H PRN Gonzella Lex, MD      . atorvastatin (LIPITOR) tablet 20 mg  20 mg Oral q1800 Gonzella Lex, MD   20 mg at 02/20/15 1730  . divalproex (DEPAKOTE ER) 24 hr tablet 1,000 mg  1,000 mg Oral QHS Gonzella Lex, MD   1,000 mg at 02/19/15 2120  . docusate sodium (COLACE) capsule 200 mg  200 mg Oral BID Clovis Fredrickson, MD   200 mg at 02/19/15 2120  . feeding supplement (ENSURE ENLIVE) (ENSURE ENLIVE) liquid 237 mL  237 mL Oral TID WC Marisa Hage B Dawit Tankard, MD   237 mL at 02/20/15 1039  . iohexol (OMNIPAQUE) 240 MG/ML injection 25 mL  25 mL Oral Once PRN  Gonzella Lex, MD      . lithium carbonate capsule 300 mg  300 mg Oral BID WC Rebecca Cairns B Danielly Ackerley, MD   300 mg at 02/20/15 1730  . loratadine (CLARITIN) tablet 10 mg  10 mg Oral Daily Gonzella Lex, MD   10 mg at 02/19/15 0830  . LORazepam (ATIVAN) tablet 1 mg  1 mg Oral BID Gonzella Lex, MD   1 mg at 02/19/15 2120  . magnesium hydroxide (MILK OF MAGNESIA) suspension 30 mL  30 mL Oral Daily PRN Gonzella Lex, MD      . montelukast (SINGULAIR) tablet 10 mg  10 mg Oral QHS Gonzella Lex, MD   10 mg at 02/19/15 2120  . polyethylene glycol (MIRALAX / GLYCOLAX) packet 17 g  17 g Oral Daily Clovis Fredrickson, MD   17 g at 02/19/15 1541    Lab Results:  Results for  orders placed or performed during the hospital encounter of 02/19/15 (from the past 48 hour(s))  Lithium level     Status: None   Collection Time: 02/19/15  7:20 AM  Result Value Ref Range   Lithium Lvl 0.84 0.60 - 1.20 mmol/L  Thyroid Panel     Status: Abnormal   Collection Time: 02/19/15  7:20 AM  Result Value Ref Range   T4, Total 12.2 (H) 4.5 - 12.0 ug/dL   T3 Uptake Ratio 40 (H) 24 - 39 %   Free Thyroxine Index 4.9 1.2 - 4.9    Comment: (NOTE) Performed At: [facility] [place], [place] [id number] Lindon Romp MD [id number]   Valproic acid level     Status: None   Collection Time: 02/19/15  7:20 AM  Result Value Ref Range   Valproic Acid Lvl 71 50.0 - 100.0 ug/mL  Ammonia     Status: None   Collection Time: 02/19/15  3:42 PM  Result Value Ref Range   Ammonia 29 9 - 35 umol/L  Thyroid Panel With TSH     Status: Abnormal   Collection Time: 02/19/15  3:42 PM  Result Value Ref Range   TSH 0.009 (L) 0.450 - 4.500 uIU/mL   T4, Total 14.2 (H) 4.5 - 12.0 ug/dL   T3 Uptake Ratio 39 24 - 39 %   Free Thyroxine Index 5.5 (H) 1.2 - 4.9    Comment: (NOTE) Performed At: [facility] [place], [place] [id number] Lindon Romp MD [id number]   Urinalysis complete, with microscopic [facility] only)     Status: Abnormal   Collection Time: 02/19/15  6:30 PM  Result Value Ref Range   Color, Urine STRAW (A) YELLOW   APPearance CLEAR (A) CLEAR   Glucose, UA NEGATIVE NEGATIVE mg/dL   Bilirubin Urine NEGATIVE NEGATIVE   Ketones, ur NEGATIVE NEGATIVE mg/dL   Specific Gravity, Urine 1.006 1.005 - 1.030   Hgb urine dipstick NEGATIVE NEGATIVE   pH 8.0 5.0 - 8.0   Protein, ur NEGATIVE NEGATIVE mg/dL   Nitrite NEGATIVE NEGATIVE   Leukocytes, UA NEGATIVE NEGATIVE   RBC / HPF NONE SEEN 0 - 5 RBC/hpf   WBC, UA 0-5 0 - 5 WBC/hpf   Bacteria, UA NONE SEEN NONE SEEN   Squamous Epithelial / LPF 0-5 (A) NONE SEEN  T4, free     Status: Abnormal    Collection Time: 02/20/15  4:31 PM  Result Value Ref Range   Free T4 2.65 (H) 0.61 - 1.12 ng/dL  Physical Findings: AIMS: Facial and Oral Movements Muscles of Facial Expression: None, normal Lips and Perioral Area: None, normal Jaw: None, normal Tongue: None, normal,Extremity Movements Upper (arms, wrists, hands, fingers): None, normal Lower (legs, knees, ankles, toes): None, normal, Trunk Movements Neck, shoulders, hips: None, normal, Overall Severity Severity of abnormal movements (highest score from questions above): None, normal Incapacitation due to abnormal movements: None, normal Patient's awareness of abnormal movements (rate only patient's report): No Awareness, Dental Status Current problems with teeth and/or dentures?: No Does patient usually wear dentures?: No  CIWA:  CIWA-Ar Total: 0 COWS:     Musculoskeletal: Strength & Muscle Tone: within normal limits Gait & Station: normal Patient leans: N/A  Psychiatric Specialty Exam: Review of Systems  Constitutional: Positive for weight loss.  Gastrointestinal: Positive for constipation.  All other systems reviewed and are negative.   Blood pressure 118/84, pulse 118, temperature 98.2 F (36.8 C), temperature source Oral, resp. rate 20, height 5\' 7"  (1.702 m), weight 56.7 kg (125 lb), SpO2 99 %.Body mass index is 19.57 kg/(m^2).  General Appearance: Casual  Eye Contact::  Fair  Speech:  Slurred  Volume:  Increased  Mood:  Dysphoric  Affect:  Labile  Thought Process:  Linear  Orientation:  Full (Time, Place, and Person)  Thought Content:  WDL  Suicidal Thoughts:  No  Homicidal Thoughts:  No  Memory:  Immediate;   Poor Recent;   Poor Remote;   Poor  Judgement:  Poor  Insight:  Lacking  Psychomotor Activity:  Normal  Concentration:  Poor  Recall:  Poor  Fund of Knowledge:Poor  Language: Poor  Akathisia:  No  Handed:  Right  AIMS (if indicated):     Assets:  Communication Skills Desire for  Improvement Financial Resources/Insurance Housing Resilience  ADL's:  Intact  Cognition: WNL  Sleep:  Number of Hours: 3   Treatment Plan Summary: Daily contact with patient to assess and evaluate symptoms and progress in treatment and Medication management   Shane Gentry is a 39 year old male with a history of schizoaffective disorder and mental retardation admitting for refusal to eat with 30 pound weight loss for which no medical reason was found.  1. Failure to thrive. The patient now eats 100% of his meals. Reportedly he was refusing food at the group home insisting on eating only takeout's.  2. Mood and psychosis. The patient has been maintained on a combination of lithium, Depakote, Risperdal Consta. Lithium level was elevated on admission. It is within therapeutic range today. Will restart lithium at the lower dose. Ammonia level is not elevated.  3. Constipation. The patient was impacted few days ago. Abdominal x-ray indicates massive amounts of stool. Will start bowel regimen and gave magnesium citrate.  4. Dyslipidemia. The patient is on Lipitor.  5. Anxiety. We'll continue Ativan  6. Abnormal thyroid function tests. Endocrinology input is greatly appreciated.   7. Disposition. He'll be discharged to home. He will follow up with Dr. Rosine Door.  Shane Gentry 02/20/2015, 8:13 PM

## 2015-02-20 NOTE — Progress Notes (Signed)
Patient states that he is feeling better this afternoon. The patient states that he is hungry. Patient ate 90% of his lunch. Will continue to monitor.

## 2015-02-20 NOTE — Plan of Care (Signed)
Problem: Alteration in mood Goal: LTG-Patient reports reduction in suicidal thoughts (Patient reports reduction in suicidal thoughts and is able to verbalize a safety plan for whenever patient is feeling suicidal)  Outcome: Progressing Denies SI/HI.

## 2015-02-20 NOTE — Plan of Care (Signed)
Problem: Nutrition Goal: Nutritional status is improving Monitor and assess patient for malnutrition (ex- brittle hair, bruises, dry skin, pale skin and conjunctiva, muscle wasting, smooth red tongue, and disorientation). Collaborate with interdisciplinary team and initiate plan and interventions as ordered. Monitor patient's weight and dietary intake as ordered or per policy. Utilize nutrition screening tool and intervene per policy. Determine patient's food preferences and provide high-protein, high-caloric foods as appropriate.  Outcome: Progressing Patient ate breakfast. Patient drank nutritional supplement. Patient refused snack and lunch.

## 2015-02-20 NOTE — Evaluation (Signed)
Physical Therapy Evaluation Patient Details Name: Shane Gentry MRN: [id number] DOB: 1976-02-07 Today's Date: 02/20/2015   History of Present Illness  Patient is a 39 y/o male that presents after having altered mood with caretakers. He has a past medical history of schizoaffective disorder. He is noted to have constipation and abdominal pain. Apparently he entered the hospital in a w/c.   Clinical Impression  PT reviewed patient's chart and discussed case with RN. Per RN patient entered [place] in a w/c and there was some concern regarding balance. Patient is a very poor historian and cannot really answer any PT question adequately. Patient appears to be at his baseline regarding balance and mobility. He displays altered gait mechanics and what appears to be tremors or shaking of some form, however his gait speed is [facility] for his age and medical history. No obvious signs of trauma from falls noted and strength appears [facility]. Patient appears to be at his baseline regarding mobility and does not show any obvious balance deficits from his anticipated baseline. No further PT needs identified.     Follow Up Recommendations No PT follow up    Equipment Recommendations       Recommendations for Other Services       Precautions / Restrictions Restrictions Weight Bearing Restrictions: No      Mobility  Bed Mobility               General bed mobility comments: Patient received standing at edge of room.   Transfers Overall transfer level: Independent               General transfer comment: No deficits in sit to stand noted.   Ambulation/Gait Ambulation/Gait assistance: Independent Ambulation Distance (Feet): 150 Feet       Gait velocity interpretation: at or above normal speed for age/gender General Gait Details: Patient displays mild ataxia with gait, some tremors noted and minimal knee flexion bilaterally to initiate gait. This appears to be his baseline.   Stairs            Wheelchair Mobility    Modified Rankin (Stroke Patients Only)       Balance Overall balance assessment: Independent                                           Pertinent Vitals/Pain Pain Assessment: No/denies pain    Home Living Family/patient expects to be discharged to:: Group home Living Arrangements: Group Home                    Prior Function Level of Independence: Independent         Comments: Patient denies any use of RW, though very difficult to understand.      Hand Dominance        Extremity/Trunk Assessment   Upper Extremity Assessment: Overall WFL for tasks assessed           Lower Extremity Assessment: Overall WFL for tasks assessed         Communication   Communication: Expressive difficulties  Cognition Arousal/Alertness: Awake/alert Behavior During Therapy: WFL for tasks assessed/performed;Anxious Overall Cognitive Status: History of cognitive impairments - at baseline                      General Comments General comments (skin integrity, edema, etc.): Performing turns slightly decreased in time, modified  DGI of 11/12 with no loss of balance.     Exercises        Assessment/Plan    PT Assessment Patent does not need any further PT services  PT Diagnosis     PT Problem List    PT Treatment Interventions     PT Goals (Current goals can be found in the Care Plan section)      Frequency     Barriers to discharge        Co-evaluation               End of Session   Activity Tolerance: Patient tolerated treatment well Patient left:  (In room) Nurse Communication: Mobility status         Time: 1224-8250 PT Time Calculation (min) (ACUTE ONLY): 9 min   Charges:   PT Evaluation $Initial PT Evaluation Tier I: 1 Procedure     PT G Codes:       Kerman Passey, PT, DPT    02/20/2015, 2:53 PM

## 2015-02-20 NOTE — Progress Notes (Signed)
Initial Nutrition Assessment   INTERVENTION:   Meals and Snacks: Cater to patient preferences by encouraging menu completion to optimize nutritional intake.  Medical Food Supplement Therapy: agree with Ensure Enlive. Will send TID on meal trays, to ensure pt is receiving and able to drink. Ensure Enlive supplement provides 350 kcal and 20 grams of protein  NUTRITION DIAGNOSIS:   Inadequate oral intake related to social / environmental circumstances as evidenced by per patient/family report.  GOAL:   Patient will meet greater than or equal to 90% of their needs  MONITOR:    (Energy Intake, Anthropometrics, Digestive system)  REASON FOR ASSESSMENT:   Consult Poor PO  ASSESSMENT:   Pt admitted with h/o schizoaffective disorder and mental retardation.  Per MD note: The patient has not been hospitalized for the last 5 years at least. Per the group home the pt started refusing regular food, eating only take out. Poor oral intake with 30 pound weight loss. Work up done by his primary care provider, gastroenterologist, and ED, with no abnormalities were found in his labs or CT scans. The patient has a history of constipation, with massive amounts of stool in his abdomen on recent CT scan.'  Past Medical History  Diagnosis Date  . Seasonal allergies   . Anxiety   . Depression   . HLD (hyperlipidemia)   . Prostate cancer ([place])   . Schizoaffective disorder ([place])   . ADD (attention deficit disorder)   . Asthma    Diet Order:  Diet regular Room service appropriate?: No; Fluid consistency:: Thin    Current Nutrition: Recorded po intake of 63% on average since admission of 4 meals. Noted 15% of breakfast yesterday however 40-100% of meals since.   Food/Nutrition-Related History: Per MD note, pt started refusing meals at the group home, only eating take-out PTA.    Medications: Colace, ativan miralax, noted maalox and milk of mag ordered prn  Electrolyte/Renal Profile and Glucose  Profile:   Recent Labs Lab 02/18/15 1311  NA 145  K 5.0  CL 109  CO2 29  BUN 15  CREATININE 1.05  CALCIUM 10.2  GLUCOSE 85   Protein Profile:  Recent Labs Lab 02/18/15 1311  ALBUMIN 3.6    Gastrointestinal Profile: Last BM: per Nsg note pt with 1 large BM since admission.    Nutrition-Focused Physical Exam Findings:  Unable to complete Nutrition-Focused physical exam at this time.    Weight Change: Per report, pt with 30lbs weight loss (19% weight loss). Per [facility] pt weight 127lbs 3 weeks ago (2% weight loss in 3 weeks)  Height:   Ht Readings from Last 1 Encounters:  02/19/15 5\' 7"  (1.702 m)    Weight:   Wt Readings from Last 1 Encounters:  02/19/15 125 lb (56.7 kg)    Wt Readings from Last 10 Encounters:  02/19/15 125 lb (56.7 kg)  02/03/15 127 lb 11.2 oz (57.924 kg)    BMI:  Body mass index is 19.57 kg/(m^2).  Estimated Nutritional Needs:   Kcal:  BEE: 1425kcals, TEE: (IF 1.0-1.2)(AF 1.3) 1852-2223kcals  Protein:  45-57g protein (0.8-1.0g/kg)  Fluid:  1418-1761mL of fluid (25-9mL/kg)    MODERATE Care Level  Dwyane Luo, RD, LDN Pager [phone]

## 2015-02-20 NOTE — BHH Group Notes (Signed)
[facility] LCSW Group Therapy  02/20/2015 12:51 PM  Type of Therapy:  Group Therapy  Participation Level:  Did Not Attend   Keene Breath, MSW, LCSWA 02/20/2015, 12:51 PM

## 2015-02-20 NOTE — Consult Note (Signed)
ENDOCRINOLOGY CONSULTATION  REFERRING PHYSICIAN: Orson Slick, MD CONSULTING PHYSICIAN:  A. Lavone Orn, MD  Chief complaint Hyperthyroidism  History of present illness Shane Gentry is a 39 y.o. seen in consultation for hyperthyroidism at the request of Orson Slick, MD. He has a h/o schizoaffective disorder and MR and is a poor historian. I was unable to understand or had difficulty understanding answers to my questions. History obtained from chart review.   He has no known prior history of thyroid disease. He has had a 20-30 lb weight loss in recent months. Was seen by GI at [facility] for this complaint. Had a CT abd with contrast 2 weeks ago, notable for retained stool. Appetite has been poor. He is very slow to eat. No known N/V.   He had a normal TSH level on 02/04/15 of 0.6 uIU/ml. He does take lithium.  Medical history Past Medical History  Diagnosis Date  . Seasonal allergies   . Anxiety   . Depression   . HLD (hyperlipidemia)   . Prostate cancer ([place])   . Schizoaffective disorder ([place])   . ADD (attention deficit disorder)   . Asthma      Surgical history History reviewed. No pertinent past surgical history.   Medications . atorvastatin  20 mg Oral q1800  . divalproex  1,000 mg Oral QHS  . docusate sodium  200 mg Oral BID  . feeding supplement (ENSURE ENLIVE)  237 mL Oral TID WC  . lithium carbonate  300 mg Oral BID WC  . loratadine  10 mg Oral Daily  . LORazepam  1 mg Oral BID  . montelukast  10 mg Oral QHS  . polyethylene glycol  17 g Oral Daily     Social history Social History  Substance Use Topics  . Smoking status: Never Smoker   . Smokeless tobacco: Never Used  . Alcohol Use: No     Family history Not known  Review of systems Not able to assess.  Exam BP 118/84 mmHg  Pulse 118  Temp(Src) 98.2 F (36.8 C) (Oral)  Resp 20  Ht 5\' 7"  (1.702 m)  Wt 56.7 kg (125 lb)  BMI 19.57 kg/m2  SpO2 99% GENERAL:  Thin AAM in NAD. HEENT:   EOMI.  Oropharynx is clear. Exophthalmos is present. NECK:  Supple.  No thyromegaly.  No neck tenderness. No palpable thyroid nodules. No thyroid bruit.  CARDIAC:  Regular rate and rhythm without murmur.  PULMONARY:  Clear to auscultation bilaterally.  ABDOMEN:  Diffusely soft, nontender, nondistended.  EXTREMITIES:  No peripheral edema is present.    +coarse tremor of the hands is present. SKIN:  No rash or dermatopathy. NEUROLOGIC:  No dysarthria.  No tremor. PSYCHIATRIC:  Alert and oriented, calm, cooperative.   Laboratory/Radiology Results for orders placed or performed during the hospital encounter of 02/19/15 (from the past 24 hour(s))  Urinalysis complete, with microscopic ([facility] only)     Status: Abnormal   Collection Time: 02/19/15  6:30 PM  Result Value Ref Range   Color, Urine STRAW (A) YELLOW   APPearance CLEAR (A) CLEAR   Glucose, UA NEGATIVE NEGATIVE mg/dL   Bilirubin Urine NEGATIVE NEGATIVE   Ketones, ur NEGATIVE NEGATIVE mg/dL   Specific Gravity, Urine 1.006 1.005 - 1.030   Hgb urine dipstick NEGATIVE NEGATIVE   pH 8.0 5.0 - 8.0   Protein, ur NEGATIVE NEGATIVE mg/dL   Nitrite NEGATIVE NEGATIVE   Leukocytes, UA NEGATIVE NEGATIVE   RBC / HPF NONE SEEN 0 - 5  RBC/hpf   WBC, UA 0-5 0 - 5 WBC/hpf   Bacteria, UA NONE SEEN NONE SEEN   Squamous Epithelial / LPF 0-5 (A) NONE SEEN    Assessment Hyperthyroidism, likely due to silent thyroiditis vs recent contrast dye load  Plan If hyperthyroidism is due to  silent thyroiditis vs recent contrast dye load, then it should resolve. If this is early [place]' then he should have elevated thyroid Abs and will need a thioamide.  Today I plan to get labs including: Free T4, total T3, and thyrotropin receptor antibody levels.   Will repeat Free T4 and Total T3 in 3 days.  If patient is discharged prior to my next follow up on Monday 02/1715, then please set up out-patient follow up with me. Office pho [phone].  Treatment plan  to be determined based on lab results.  Avoid further exposure to contrast dye until hyperthyroidism resolves.  Thank you for the kind request for consultation.

## 2015-02-20 NOTE — BHH Group Notes (Signed)
[facility] LCSW Aftercare Discharge Planning Group Note   02/20/2015 11:12 AM  Participation Quality:  Patient attended group but was not able to participate in a group discussion as patient as interrupting and asking for ice cream. Patient was redirectable for short periods of time but is probably not appropriate for a processing group due to his limitations. Patient did share his SMART goal is to "stay here forever".   Mood/Affect:  Labile  Thoughts of Suicide:  No Will you contract for safety?   NA  Current AVH:  No    Keene Breath, MSW, [employer]

## 2015-02-20 NOTE — Progress Notes (Signed)
Recreation Therapy Notes  Date: 10.14.16 Time: 3:00 pm Location: Craft Room  Group Topic: Coping Skills  Goal Area(s) Addresses:  Patient will participate in coping skills. Patient will verbalize benefit of using art as a coping skill.  Behavioral Response: Did not attend  Intervention: Coloring  Activity: Patients were given coloring sheets and instructed to color thinking about what emotions they were feeling and what they were focused on.  Education: LRT educated patients on why art is a good coping skill.   Education Outcome: Patient did not attend group.  Clinical Observations/Feedback: Patient did not attend group.  [name] M, LRT/CTRS 02/20/2015 5:11 PM 

## 2015-02-20 NOTE — BHH Group Notes (Signed)
[place] LCSW Group Therapy  02/20/2015 2:59 PM  Type of Therapy:  Group Therapy  Participation Level:  Did Not Attend  Modes of Intervention:  Discussion, Education, Socialization and Support  Summary of Progress/Problems: Feelings around Relapse. Group members discussed the meaning of relapse and shared personal stories of relapse, how it affected them and others, and how they perceived themselves during this time. Group members were encouraged to identify triggers, warning signs and coping skills used when facing the possibility of relapse. Social supports were discussed and explored in detail.   [place] MSW, [place]  02/20/2015, 2:59 PM

## 2015-02-20 NOTE — Tx Team (Signed)
Interdisciplinary Treatment Plan Update (Adult)  Date:  02/20/2015 Time Reviewed:  4:12 PM  Progress in Treatment: Attending groups: No. Participating in groups:  No. Taking medication as prescribed:  Yes. Tolerating medication:  Yes. Family/Significant othe contact made:  No, will contact:  Group home Patient understands diagnosis:  No. and As evidenced by:  Limited insight Discussing patient identified problems/goals with staff:  Yes. Medical problems stabilized or resolved:  Yes. Denies suicidal/homicidal ideation: Yes. Issues/concerns per patient self-inventory:  Yes. Other:  New problem(s) identified: No, Describe:  NA  Discharge Plan or Barriers: Pt plans to return to group home and follow up with outpatient   Reason for Continuation of Hospitalization: Depression Medication stabilization  Comments:Reportedly the patient has a long history of schizoaffective disorder. He has been maintained on a combination of lithium, Depakote, and Risperdal Consta history primary provider Dr. Rosine Door. The patient has not been hospitalized for the last 5 years at least." So ago the patient started refusing regular food at his abdomen. That he eats out all the time. Poor oral intake to 30 pound weight loss. This was investigated by his primary care provider, gastroenterologist, and emergency room doctors. No abnormalities were found in his labs or CT scans. The patient has a history of constipation. He was contacted few days ago. There are still massive amounts of stool in his abdomen recent CT scan. The patient does not have anything to report except that he likes it here and wants to stay. The patient apparently has a very good relationship with caretakers. At present before the group home stay with the patient in the emergency room which is appropriate but uncommon. Today he refused to talk to the group home owner who tried to call him on the phone. The patient denies any symptoms of depression,  anxiety, or psychosis. There are no symptoms suggestive of bipolar mania. He does not use alcohol or drugs. He has been compliant with his treatment as indicated by therapeutic level of Depakote and elevated level of lithium.  Estimated length of stay: 7 days   New goal(s): NA  Review of initial/current patient goals per problem list:   1.  Goal(s): Patient will participate in aftercare plan * Met:  * Target date: at discharge * As evidenced by: Patient will participate within aftercare plan AEB aftercare provider and housing plan at discharge being identified.   2.  Goal (s): Patient will exhibit decreased depressive symptoms and suicidal ideations. * Met:  *  Target date: at discharge * As evidenced by: Patient will utilize self rating of depression at 3 or below and demonstrate decreased signs of depression or be deemed stable for discharge by MD.  3.  Goal (s): Patient will demonstrate decreased symptoms of psychosis. * Met: No  *  Target date: at discharge * As evidenced by: Patient will not endorse signs of psychosis or be deemed stable for discharge by MD.   Attendees: Patient:  Shane Gentry 10/14/20164:12 PM  Family:   10/14/20164:12 PM  Physician:  Dr. Guadlupe Spanish  10/14/20164:12 PM  Nursing:   Dorthula Matas  10/14/20164:12 PM  Case Manager:   10/14/20164:12 PM  Counselor:   10/14/20164:12 PM  Other:  Wray Kearns, Dana  10/14/20164:12 PM  Other:  Everitt Amber, Douglas  10/14/20164:12 PM  Other:   10/14/20164:12 PM  Other:  10/14/20164:12 PM  Other:  10/14/20164:12 PM  Other:  10/14/20164:12 PM  Other:  10/14/20164:12 PM  Other:  10/14/20164:12 PM  Other:  10/14/20164:12  PM  Other:   10/14/20164:12 PM   Scribe for Treatment Team:   Wray Kearns, MSW, LCSWA  02/20/2015, 4:12 PM

## 2015-02-20 NOTE — Progress Notes (Signed)
Recreation Therapy Notes  At approximately 1:50 pm, LRT spoke with patient's nurse regarding assessment. Patient's nurse did not think he could answer the assessment questions at this time.  Leonette Monarch, LRT/CTRS 02/20/2015 5:49 PM

## 2015-02-21 LAB — T3: T3 TOTAL: 200 ng/dL — AB (ref 71–180)

## 2015-02-21 MED ORDER — ENSURE ENLIVE PO LIQD
237.0000 mL | Freq: Three times a day (TID) | ORAL | Status: DC
  Administered 2015-02-22 – 2015-02-24 (×7): 237 mL via ORAL

## 2015-02-21 NOTE — [facility] Group Notes (Signed)
[facility] LCSW Group Therapy  02/21/2015 11:08 AM  Type of Therapy:  Group Therapy  Participation Level:  Did Not Attend  Modes of Intervention:  Discussion, Education, Socialization and Support  Summary of Progress/Problems: Balance in life: Patients will discuss the concept of balance and how it looks and feels to be unbalanced. Pt will identify areas in their life that is unbalanced and ways to become more balanced.    Lucifer Soja L Sharnell Knight MSW, LCSWA  02/21/2015, 11:08 AM  

## 2015-02-21 NOTE — Progress Notes (Signed)
D:  Patient requires frequent redirection.  Constantly wanting to eat and requesting ice cream or something to eat.  Speech slurred and difficult to understand.  Patient refusing to drink ensure states "that's nasty."  Earlier today patient noted crying but unable to verbalize what was wrong.  Patient likes to stay in his room in the bed. A:  Support and encouragement offered.  Encouraged to go to go group and to get out of the room more.  Medications given as ordered. R:  No group attendance.  Medication complaint. Safety maintained and Q 15 min checks performed.

## 2015-02-21 NOTE — Plan of Care (Signed)
Problem: Alteration in mood Goal: LTG-Patient reports reduction in suicidal thoughts (Patient reports reduction in suicidal thoughts and is able to verbalize a safety plan for whenever patient is feeling suicidal)  Outcome: Progressing Patient denies SI/HI.      

## 2015-02-21 NOTE — Progress Notes (Signed)
D: Pt is alert with periods of confusion to time and situation. Patient was frequently redirected to maintain safety on unit, patient denies SI/HI/AVH. Patient appears anxious, apprehensive and fearful, and his way of interaction is childlike towards staff, often apologizing for being redirected or reoriented.  Pt was offered support and encouragement. Pt was given scheduled medications. Pt was encouraged to attend groups. Q 15 minute checks were done for safety.  R:Pt did not attend evening group. Pt is compliant with medication..Pt receptive to treatment and safety maintained on unit.

## 2015-02-21 NOTE — [facility] Group Notes (Signed)
[facility] Group Notes:  (Nursing/MHT/Case Management/Adjunct)  Date:  02/21/2015  Time:  6:43 PM  Type of Therapy:  Group Therapy  Participation Level:  Did Not Attend  Participation Quality:  n/a  Affect:  n/a  Cognitive:  n/a  Insight:  None  Engagement in Group:  None  Modes of Intervention:  n/a  Summary of Progress/Problems:  Jessee Avers 02/21/2015, 6:43 PM

## 2015-02-21 NOTE — Progress Notes (Signed)
[facility] MD Progress Note  02/21/2015 12:29 PM Kolt Mcwhirter  MRN:  [id number]  Subjective:  Mr. Savino has a long history of schizophrenia in the ontext of developmental disability. He was admitted for failure to thrive when medicalevaluation turned out negative. He does however have abnormal thyroid tests. Dr. Joycie Peek help is greatly ppreciated.  Patient is difficult to interview due to his cognitive limitations. His speech is slurred and very hard to understand. The patient tells me he is hungry and he is not getting enough to eat. He was later on seen in the dayroom asking for chocolate ice cream at 9 in the morning.  He terminated the assessment as he said he needed to go to the bathroom.  Her nursing staff there have not been any issues of concern the patient is focused on wanting to eat more and they tell me apparently the patient was admitted with poor oral intake and severe weight loss.  Per nursing: D: Pt is alert with periods of confusion to time and situation. Patient was frequently redirected to maintain safety on unit, patient denies SI/HI/AVH. Patient appears anxious, apprehensive and fearful, and his way of interaction is childlike towards staff, often apologizing for being redirected or reoriented.  Pt was offered support and encouragement. Pt was given scheduled medications. Pt was encouraged to attend groups. Q 15 minute checks were done for safety.  R:Pt did not attend evening group. Pt is compliant with medication..Pt receptive to treatment and safety maintained on unit.  Principal Problem: Schizoaffective disorder, depressive type ([place]) Diagnosis:   Patient Active Problem List   Diagnosis Date Noted  . Failure to thrive in adult [R62.7] 02/19/2015  . Schizoaffective disorder, depressive type ([place]) [F25.1] 02/18/2015  . Elevated lithium level [R79.9] 02/18/2015  . Constipation [K59.00] 02/18/2015  . Abnormal thyroid blood test [R94.6] 02/18/2015  . 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 Meatal stenosis [N35.9] 02/06/2015   Total Time spent with patient: 30 minutes  Past Psychiatric History: Schizophrnia and MR.  Past Medical History:  Past Medical History  Diagnosis Date  . Seasonal allergies   . Anxiety   . Depression   . HLD (hyperlipidemia)   . Prostate cancer ([place])   . Schizoaffective disorder ([place])   . ADD (attention deficit disorder)   . Asthma    History reviewed. No pertinent past surgical history. Family History: History reviewed. No pertinent family history. Family Psychiatric  History: Unknown. Social History:  History  Alcohol Use No     History  Drug Use No    Social History   Social History  . Marital Status: Single    Spouse Name: N/A  . Number of Children: N/A  . Years of Education: N/A   Social History Main Topics  . Smoking status: Never Smoker   . Smokeless tobacco: Never Used  . Alcohol Use: No  . Drug Use: No  . Sexual Activity: Not Currently   Other Topics Concern  . None   Social History Narrative   Additional Social History:    History of alcohol / drug use?: No history of alcohol / drug abuse                    Sleep: Poor  Appetite:  Fair  Current Medications: Current Facility-Administered Medications  Medication Dose Route Frequency Provider Last Rate Last Dose  . acetaminophen (TYLENOL) tablet 650 mg  650 mg Oral Q6H PRN Gonzella Lex, MD      .  alum & mag hydroxide-simeth (MAALOX/MYLANTA) 200-200-20 MG/5ML suspension 30 mL  30 mL Oral Q4H PRN Gonzella Lex, MD      . atorvastatin (LIPITOR) tablet 20 mg  20 mg Oral q1800 Gonzella Lex, MD   20 mg at 02/20/15 1730  . divalproex (DEPAKOTE ER) 24 hr tablet 1,000 mg  1,000 mg Oral QHS Gonzella Lex, MD   1,000 mg at 02/20/15 2210  . docusate sodium (COLACE) capsule 200 mg  200 mg Oral BID Clovis Fredrickson, MD   200 mg at 02/21/15 0904  . feeding supplement (ENSURE ENLIVE) (ENSURE ENLIVE) liquid 237 mL  237 mL Oral  TID WC Jolanta B Pucilowska, MD   237 mL at 02/21/15 0800  . iohexol (OMNIPAQUE) 240 MG/ML injection 25 mL  25 mL Oral Once PRN Gonzella Lex, MD      . lithium carbonate capsule 300 mg  300 mg Oral BID WC Jolanta B Pucilowska, MD   300 mg at 02/21/15 0904  . loratadine (CLARITIN) tablet 10 mg  10 mg Oral Daily Gonzella Lex, MD   10 mg at 02/21/15 0904  . LORazepam (ATIVAN) tablet 1 mg  1 mg Oral BID Gonzella Lex, MD   1 mg at 02/21/15 0904  . magnesium hydroxide (MILK OF MAGNESIA) suspension 30 mL  30 mL Oral Daily PRN Gonzella Lex, MD      . montelukast (SINGULAIR) tablet 10 mg  10 mg Oral QHS Gonzella Lex, MD   10 mg at 02/20/15 2212  . polyethylene glycol (MIRALAX / GLYCOLAX) packet 17 g  17 g Oral Daily Clovis Fredrickson, MD   17 g at 02/21/15 0904  . temazepam (RESTORIL) capsule 15 mg  15 mg Oral QHS Clovis Fredrickson, MD   15 mg at 02/20/15 2211    Lab Results:  Results for orders placed or performed during the hospital encounter of 02/19/15 (from the past 48 hour(s))  Ammonia     Status: None   Collection Time: 02/19/15  3:42 PM  Result Value Ref Range   Ammonia 29 9 - 35 umol/L  Thyroid Panel With TSH     Status: Abnormal   Collection Time: 02/19/15  3:42 PM  Result Value Ref Range   TSH 0.009 (L) 0.450 - 4.500 uIU/mL   T4, Total 14.2 (H) 4.5 - 12.0 ug/dL   T3 Uptake Ratio 39 24 - 39 %   Free Thyroxine Index 5.5 (H) 1.2 - 4.9    Comment: (NOTE) Performed At: [facility] [place], [place] [id number] Lindon Romp MD [id number]   Urinalysis complete, with microscopic [facility] only)     Status: Abnormal   Collection Time: 02/19/15  6:30 PM  Result Value Ref Range   Color, Urine STRAW (A) YELLOW   APPearance CLEAR (A) CLEAR   Glucose, UA NEGATIVE NEGATIVE mg/dL   Bilirubin Urine NEGATIVE NEGATIVE   Ketones, ur NEGATIVE NEGATIVE mg/dL   Specific Gravity, Urine 1.006 1.005 - 1.030   Hgb urine dipstick NEGATIVE NEGATIVE   pH 8.0 5.0 -  8.0   Protein, ur NEGATIVE NEGATIVE mg/dL   Nitrite NEGATIVE NEGATIVE   Leukocytes, UA NEGATIVE NEGATIVE   RBC / HPF NONE SEEN 0 - 5 RBC/hpf   WBC, UA 0-5 0 - 5 WBC/hpf   Bacteria, UA NONE SEEN NONE SEEN   Squamous Epithelial / LPF 0-5 (A) NONE SEEN  T4, free     Status: Abnormal  Collection Time: 02/20/15  4:31 PM  Result Value Ref Range   Free T4 2.65 (H) 0.61 - 1.12 ng/dL  T3     Status: Abnormal   Collection Time: 02/20/15  4:31 PM  Result Value Ref Range   T3, Total 200 (H) 71 - 180 ng/dL    Comment: (NOTE) Performed At: [facility] [place], [place] [id number] Lindon Romp MD [id number]     Physical Findings: AIMS: Facial and Oral Movements Muscles of Facial Expression: None, normal Lips and Perioral Area: None, normal Jaw: None, normal Tongue: None, normal,Extremity Movements Upper (arms, wrists, hands, fingers): None, normal Lower (legs, knees, ankles, toes): None, normal, Trunk Movements Neck, shoulders, hips: None, normal, Overall Severity Severity of abnormal movements (highest score from questions above): None, normal Incapacitation due to abnormal movements: None, normal Patient's awareness of abnormal movements (rate only patient's report): No Awareness, Dental Status Current problems with teeth and/or dentures?: No Does patient usually wear dentures?: No  CIWA:  CIWA-Ar Total: 0 COWS:     Musculoskeletal: Strength & Muscle Tone: within normal limits Gait & Station: normal Patient leans: N/A  Psychiatric Specialty Exam: Review of Systems  Constitutional: Positive for weight loss.  HENT: Negative.   Eyes: Negative.   Respiratory: Negative.   Cardiovascular: Negative.   Gastrointestinal: Positive for constipation.  Genitourinary: Negative.   Musculoskeletal: Negative.   Skin: Negative.   Neurological: Negative.   Endo/Heme/Allergies: Negative.   Psychiatric/Behavioral: Negative.   All other systems reviewed and are  negative.   Blood pressure 94/58, pulse 128, temperature 98 F (36.7 C), temperature source Oral, resp. rate 20, height 5\' 7"  (1.702 m), weight 56.7 kg (125 lb), SpO2 99 %.Body mass index is 19.57 kg/(m^2).  General Appearance: Casual  Eye Contact::  Fair  Speech:  Slurred  Volume:  Increased  Mood:  Dysphoric  Affect:  Labile  Thought Process:  Linear  Orientation:  Full (Time, Place, and Person)  Thought Content:  WDL  Suicidal Thoughts:  No  Homicidal Thoughts:  No  Memory:  Immediate;   Poor Recent;   Poor Remote;   Poor  Judgement:  Poor  Insight:  Lacking  Psychomotor Activity:  Normal  Concentration:  Poor  Recall:  Poor  Fund of Knowledge:Poor  Language: Poor  Akathisia:  No  Handed:  Right  AIMS (if indicated):     Assets:  Communication Skills Desire for Improvement Financial Resources/Insurance Housing Resilience  ADL's:  Intact  Cognition: WNL  Sleep:  Number of Hours: 5.15   Treatment Plan Summary: Daily contact with patient to assess and evaluate symptoms and progress in treatment and Medication management   Mr. Iannello is a 39 year old male with a history of schizoaffective disorder and mental retardation admitting for refusal to eat with 30 pound weight loss for which no medical reason was found.  1. Failure to thrive. The patient now eats 100% of his meals. Reportedly he was refusing food at the group home insisting on eating only takeout's.  2. Mood and psychosis. The patient has been maintained on a combination of lithium, Depakote, Risperdal Consta. Lithium level was elevated on admission. It is within therapeutic range today. Will restart lithium at the lower dose. Ammonia level is not elevated.  3. Constipation. The patient was impacted few days ago. Abdominal x-ray indicates massive amounts of stool. Will start bowel regimen and gave magnesium citrate.  4. Dyslipidemia. The patient is on Lipitor.  5. Anxiety. We'll continue Ativan  6.  Abnormal thyroid function tests. Endocrinology input is greatly appreciated.   7. Disposition. He'll be discharged to home. He will follow up with Dr. Rosine Door.  I will not make any changes in the patient's medication regimen as he appears to be a stable and improving. Patient's major concern has been around meals and not getting enough to eat. I will order in ensure 4 times a day.  Hildred Priest 02/21/2015, 12:29 PM

## 2015-02-22 LAB — THYROTROPIN RECEPTOR AUTOABS: THYROTROPIN RECEPTOR AB: 0.54 IU/L (ref 0.00–1.75)

## 2015-02-22 NOTE — Progress Notes (Signed)
D: Patient has appeared anxious today, questioning why he has to take medication. He denies feeling depressed or suicidal. No evidence of psychosis. A: On q 15 minute checks. Offered emotional support. Given meds. R: Cooperative. Attended group.

## 2015-02-22 NOTE — Progress Notes (Signed)
[facility] MD Progress Note  02/22/2015 12:51 PM Shane Gentry  MRN:  [id number]  Subjective:  Shane Gentry has a long history of schizophrenia in the ontext of developmental disability. He was admitted for failure to thrive when medicalevaluation turned out negative. He does however have abnormal thyroid tests. Dr. Joycie Peek help is greatly ppreciated.  Patient is difficult to interview due to his cognitive limitations. His speech is slurred and very hard to understand. The patient tells me he is hungry and wants a soda and cannot wait until lunch.  Nursing staff does not report patient having any events overnight. He is stable and doing okay here in the unit. Despite reports of not eating at the group home the patient's oral intake seems to be within normal limits here.  Per nursing: Client has been asking for ice cream and soda/coke; has been up pacing; drinking several cups of water; speech remains slurred; has had one episode of bedwetting. Currently denies s.i./h.i.  Principal Problem: Schizoaffective disorder, depressive type ([place]) Diagnosis:   Patient Active Problem List   Diagnosis Date Noted  . Failure to thrive in adult [R62.7] 02/19/2015  . Schizoaffective disorder, depressive type ([place]) [F25.1] 02/18/2015  . Elevated lithium level [R79.9] 02/18/2015  . Constipation [K59.00] 02/18/2015  . Abnormal thyroid blood test [R94.6] 02/18/2015  . 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 Meatal stenosis [N35.9] 02/06/2015   Total Time spent with patient: 30 minutes  Past Psychiatric History: Schizophrnia and MR.  Past Medical History:  Past Medical History  Diagnosis Date  . Seasonal allergies   . Anxiety   . Depression   . HLD (hyperlipidemia)   . Prostate cancer ([place])   . Schizoaffective disorder ([place])   . ADD (attention deficit disorder)   . Asthma    History reviewed. No pertinent past surgical history. Family History: History reviewed. No pertinent family  history. Family Psychiatric  History: Unknown. Social History:  History  Alcohol Use No     History  Drug Use No    Social History   Social History  . Marital Status: Single    Spouse Name: N/A  . Number of Children: N/A  . Years of Education: N/A   Social History Main Topics  . Smoking status: Never Smoker   . Smokeless tobacco: Never Used  . Alcohol Use: No  . Drug Use: No  . Sexual Activity: Not Currently   Other Topics Concern  . None   Social History Narrative   Additional Social History:    History of alcohol / drug use?: No history of alcohol / drug abuse                    Sleep: Poor  Appetite:  Fair  Current Medications: Current Facility-Administered Medications  Medication Dose Route Frequency Provider Last Rate Last Dose  . acetaminophen (TYLENOL) tablet 650 mg  650 mg Oral Q6H PRN Gonzella Lex, MD   650 mg at 02/21/15 1738  . alum & mag hydroxide-simeth (MAALOX/MYLANTA) 200-200-20 MG/5ML suspension 30 mL  30 mL Oral Q4H PRN Gonzella Lex, MD      . atorvastatin (LIPITOR) tablet 20 mg  20 mg Oral q1800 Gonzella Lex, MD   20 mg at 02/21/15 1738  . divalproex (DEPAKOTE ER) 24 hr tablet 1,000 mg  1,000 mg Oral QHS Gonzella Lex, MD   1,000 mg at 02/21/15 2136  . docusate sodium (COLACE) capsule 200 mg  200 mg Oral BID  Clovis Fredrickson, MD   200 mg at 02/22/15 0938  . feeding supplement (ENSURE ENLIVE) (ENSURE ENLIVE) liquid 237 mL  237 mL Oral TID PC & HS Hildred Priest, MD   237 mL at 02/22/15 1235  . iohexol (OMNIPAQUE) 240 MG/ML injection 25 mL  25 mL Oral Once PRN Gonzella Lex, MD      . lithium carbonate capsule 300 mg  300 mg Oral BID WC Jolanta B Pucilowska, MD   300 mg at 02/22/15 0733  . loratadine (CLARITIN) tablet 10 mg  10 mg Oral Daily Gonzella Lex, MD   10 mg at 02/22/15 9833  . LORazepam (ATIVAN) tablet 1 mg  1 mg Oral BID Gonzella Lex, MD   1 mg at 02/22/15 8250  . magnesium hydroxide (MILK OF MAGNESIA)  suspension 30 mL  30 mL Oral Daily PRN Gonzella Lex, MD      . montelukast (SINGULAIR) tablet 10 mg  10 mg Oral QHS Gonzella Lex, MD   10 mg at 02/21/15 2136  . polyethylene glycol (MIRALAX / GLYCOLAX) packet 17 g  17 g Oral Daily Clovis Fredrickson, MD   17 g at 02/22/15 0938  . temazepam (RESTORIL) capsule 15 mg  15 mg Oral QHS Clovis Fredrickson, MD   15 mg at 02/21/15 2136    Lab Results:  Results for orders placed or performed during the hospital encounter of 02/19/15 (from the past 48 hour(s))  T4, free     Status: Abnormal   Collection Time: 02/20/15  4:31 PM  Result Value Ref Range   Free T4 2.65 (H) 0.61 - 1.12 ng/dL  T3     Status: Abnormal   Collection Time: 02/20/15  4:31 PM  Result Value Ref Range   T3, Total 200 (H) 71 - 180 ng/dL    Comment: (NOTE) Performed At: [facility] [place], [place] [id number] Lindon Romp MD PF:[id number]     Physical Findings: AIMS: Facial and Oral Movements Muscles of Facial Expression: None, normal Lips and Perioral Area: None, normal Jaw: None, normal Tongue: None, normal,Extremity Movements Upper (arms, wrists, hands, fingers): None, normal Lower (legs, knees, ankles, toes): None, normal, Trunk Movements Neck, shoulders, hips: None, normal, Overall Severity Severity of abnormal movements (highest score from questions above): None, normal Incapacitation due to abnormal movements: None, normal Patient's awareness of abnormal movements (rate only patient's report): No Awareness, Dental Status Current problems with teeth and/or dentures?: No Does patient usually wear dentures?: No  CIWA:  CIWA-Ar Total: 0 COWS:     Musculoskeletal: Strength & Muscle Tone: within normal limits Gait & Station: normal Patient leans: N/A  Psychiatric Specialty Exam: Review of Systems  Constitutional: Positive for weight loss.  HENT: Negative.   Eyes: Negative.   Respiratory: Negative.   Cardiovascular:  Negative.   Gastrointestinal: Negative.  Negative for constipation.  Genitourinary: Negative.   Musculoskeletal: Negative.   Skin: Negative.   Neurological: Negative.   Endo/Heme/Allergies: Negative.   Psychiatric/Behavioral: Negative.   All other systems reviewed and are negative.   Blood pressure 118/70, pulse 184, temperature 98.8 F (37.1 C), temperature source Oral, resp. rate 20, height 5\' 7"  (1.702 m), weight 56.7 kg (125 lb), SpO2 100 %.Body mass index is 19.57 kg/(m^2).  General Appearance: Casual  Eye Contact::  Fair  Speech:  Slurred  Volume:  Increased  Mood:  Dysphoric  Affect:  Labile  Thought Process:  Linear  Orientation:  Full (Time,  Place, and Person)  Thought Content:  WDL  Suicidal Thoughts:  No  Homicidal Thoughts:  No  Memory:  Immediate;   Poor Recent;   Poor Remote;   Poor  Judgement:  Poor  Insight:  Lacking  Psychomotor Activity:  Normal  Concentration:  Poor  Recall:  Poor  Fund of Knowledge:Poor  Language: Poor  Akathisia:  No  Handed:  Right  AIMS (if indicated):     Assets:  Communication Skills Desire for Improvement Financial Resources/Insurance Housing Resilience  ADL's:  Intact  Cognition: WNL  Sleep:  Number of Hours: 4.15   Treatment Plan Summary: Daily contact with patient to assess and evaluate symptoms and progress in treatment and Medication management   Shane Gentry is a 39 year old male with a history of schizoaffective disorder and mental retardation admitting for refusal to eat with 30 pound weight loss for which no medical reason was found.  1. Failure to thrive. The patient now eats 100% of his meals.   2. Mood and psychosis. The patient has been maintained on a combination of lithium, Depakote, Risperdal Consta. Lithium level was elevated on admission. It is within therapeutic range today. Will restart lithium at the lower dose. Ammonia level is not elevated.  3. Constipation. The patient was impacted few days ago.  Abdominal x-ray indicates massive amounts of stool. Will start bowel regimen and gave magnesium citrate.  4. Dyslipidemia. The patient is on Lipitor.  5. Anxiety. We'll continue Ativan  6. Abnormal thyroid function tests. Endocrinology input is greatly appreciated.   7. Disposition. He'll be discharged to home. He will follow up with Dr. Rosine Door.  Elevated HR of 184. Likely an error. Patient is not in any distress. I will ask nursing staff to recheck his pulse manually and from now on only check the pulse manually.  Patient is a stable no changes will be made today.   Hildred Priest 02/22/2015, 12:51 PM

## 2015-02-22 NOTE — BHH Group Notes (Signed)
[facility] Group Notes:  (Nursing/MHT/Case Management/Adjunct)  Date:  02/22/2015  Time:  9:34 PM  Type of Therapy:  Group Therapy  Participation Level:  Active  Participation Quality:  Appropriate  Affect:  Appropriate  Cognitive:  Appropriate  Insight:  Appropriate  Engagement in Group:  Engaged  Modes of Intervention:  Discussion  Summary of Progress/Problems:  Shane Gentry 02/22/2015, 9:34 PM

## 2015-02-22 NOTE — BHH Group Notes (Signed)
[facility] LCSW Group Therapy  02/22/2015 2:59 PM  Type of Therapy:  Group Therapy  Participation Level:  Minimal  Participation Quality:  Inattentive  Affect:  Flat  Cognitive:  Lacking  Insight:  Limited  Engagement in Therapy:  Limited  Modes of Intervention:  Discussion, Education, Socialization and Support  Summary of Progress/Problems:Pt will identify unhealthy thoughts and how they impact their emotions and behavior. Pt will be encouraged to discuss these thoughts, emotions and behaviors with the group. Pt was very concerned about when the next time he would be given food. He reports eating lunch but was concerned he would not get dinner.   [place] MSW, [place]  02/22/2015, 2:59 PM

## 2015-02-22 NOTE — Progress Notes (Signed)
Client has been asking for ice cream and soda/coke; has been up pacing; drinking several cups of water; speech remains slurred; has had one episode of bedwetting. Currently denies s.i./h.i.

## 2015-02-23 LAB — T4, FREE: FREE T4: 2.5 ng/dL — AB (ref 0.61–1.12)

## 2015-02-23 MED ORDER — TEMAZEPAM 15 MG PO CAPS
30.0000 mg | ORAL_CAPSULE | Freq: Every day | ORAL | Status: DC
  Administered 2015-02-23: 30 mg via ORAL
  Filled 2015-02-23: qty 2

## 2015-02-23 NOTE — Plan of Care (Signed)
Problem: Alteration in mood Goal: STG-Patient reports thoughts of self-harm to staff Outcome: Progressing Patient denies any SI at this time.  Patient has been informed when to contact staff if symptoms worsen.

## 2015-02-23 NOTE — Progress Notes (Signed)
[facility] MD Progress Note  02/23/2015 12:54 PM Shane Gentry  MRN:  [id number]  Subjective:  Shane Gentry feels better. He has no complaints. He denies symptoms of anxiety depression or psychosis. His appetite is improved and he consumes 100% of his meals. Sleep is adequate. He tolerates medications well. There are no behavioral problems. He participates in programming some. Free T4 is still elevated. Endocrinology input is greatly appreciated.  Principal Problem: Schizoaffective disorder, depressive type ([place]) Diagnosis:   Patient Active Problem List   Diagnosis Date Noted  . Failure to thrive in adult [R62.7] 02/19/2015  . Schizoaffective disorder, depressive type ([place]) [F25.1] 02/18/2015  . Elevated lithium level [R79.9] 02/18/2015  . Constipation [K59.00] 02/18/2015  . Abnormal thyroid blood test [R94.6] 02/18/2015  . 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 Meatal stenosis [N35.9] 02/06/2015   Total Time spent with patient: 20 minutes  Past Psychiatric History: Schizophrenia and developmental disability.  Past Medical History:  Past Medical History  Diagnosis Date  . Seasonal allergies   . Anxiety   . Depression   . HLD (hyperlipidemia)   . Prostate cancer ([place])   . Schizoaffective disorder ([place])   . ADD (attention deficit disorder)   . Asthma    History reviewed. No pertinent past surgical history. Family History: History reviewed. No pertinent family history. Family Psychiatric  History: unknown. Social History:  History  Alcohol Use No     History  Drug Use No    Social History   Social History  . Marital Status: Single    Spouse Name: N/A  . Number of Children: N/A  . Years of Education: N/A   Social History Main Topics  . Smoking status: Never Smoker   . Smokeless tobacco: Never Used  . Alcohol Use: No  . Drug Use: No  . Sexual Activity: Not Currently   Other Topics Concern  . None   Social History Narrative   Additional Social History:     History of alcohol / drug use?: No history of alcohol / drug abuse                    Sleep: Poor  Appetite:  Good  Current Medications: Current Facility-Administered Medications  Medication Dose Route Frequency Provider Last Rate Last Dose  . acetaminophen (TYLENOL) tablet 650 mg  650 mg Oral Q6H PRN Gonzella Lex, MD   650 mg at 02/21/15 1738  . alum & mag hydroxide-simeth (MAALOX/MYLANTA) 200-200-20 MG/5ML suspension 30 mL  30 mL Oral Q4H PRN Gonzella Lex, MD      . atorvastatin (LIPITOR) tablet 20 mg  20 mg Oral q1800 Gonzella Lex, MD   20 mg at 02/22/15 1721  . divalproex (DEPAKOTE ER) 24 hr tablet 1,000 mg  1,000 mg Oral QHS Gonzella Lex, MD   1,000 mg at 02/22/15 2226  . docusate sodium (COLACE) capsule 200 mg  200 mg Oral BID Jolanta B Pucilowska, MD   200 mg at 02/23/15 1026  . feeding supplement (ENSURE ENLIVE) (ENSURE ENLIVE) liquid 237 mL  237 mL Oral TID PC & HS Hildred Priest, MD   237 mL at 02/23/15 1233  . iohexol (OMNIPAQUE) 240 MG/ML injection 25 mL  25 mL Oral Once PRN Gonzella Lex, MD      . lithium carbonate capsule 300 mg  300 mg Oral BID WC Jolanta B Pucilowska, MD   300 mg at 02/23/15 0803  . loratadine (CLARITIN) tablet 10 mg  10 mg Oral Daily Gonzella Lex, MD   10 mg at 02/23/15 1027  . LORazepam (ATIVAN) tablet 1 mg  1 mg Oral BID Gonzella Lex, MD   1 mg at 02/23/15 1026  . magnesium hydroxide (MILK OF MAGNESIA) suspension 30 mL  30 mL Oral Daily PRN Gonzella Lex, MD      . montelukast (SINGULAIR) tablet 10 mg  10 mg Oral QHS Gonzella Lex, MD   10 mg at 02/22/15 2226  . polyethylene glycol (MIRALAX / GLYCOLAX) packet 17 g  17 g Oral Daily Jolanta B Pucilowska, MD   17 g at 02/23/15 1026  . temazepam (RESTORIL) capsule 15 mg  15 mg Oral QHS Clovis Fredrickson, MD   15 mg at 02/22/15 2226    Lab Results:  Results for orders placed or performed during the hospital encounter of 02/19/15 (from the past 48 hour(s))  T4, free      Status: Abnormal   Collection Time: 02/23/15  6:40 AM  Result Value Ref Range   Free T4 2.50 (H) 0.61 - 1.12 ng/dL    Physical Findings: AIMS: Facial and Oral Movements Muscles of Facial Expression: None, normal Lips and Perioral Area: None, normal Jaw: None, normal Tongue: None, normal,Extremity Movements Upper (arms, wrists, hands, fingers): None, normal Lower (legs, knees, ankles, toes): None, normal, Trunk Movements Neck, shoulders, hips: None, normal, Overall Severity Severity of abnormal movements (highest score from questions above): None, normal Incapacitation due to abnormal movements: None, normal Patient's awareness of abnormal movements (rate only patient's report): No Awareness, Dental Status Current problems with teeth and/or dentures?: No Does patient usually wear dentures?: No  CIWA:  CIWA-Ar Total: 0 COWS:     Musculoskeletal: Strength & Muscle Tone: within normal limits Gait & Station: normal Patient leans: N/A  Psychiatric Specialty Exam: Review of Systems  All other systems reviewed and are negative.   Blood pressure 119/73, pulse 121, temperature 99.1 F (37.3 C), temperature source Oral, resp. rate 20, height 5\' 7"  (1.702 m), weight 56.7 kg (125 lb), SpO2 100 %.Body mass index is 19.57 kg/(m^2).  General Appearance: Casual  Eye Contact::  Good  Speech:  Slurred  Volume:  Normal  Mood:  Euthymic  Affect:  Appropriate  Thought Process:  Goal Directed  Orientation:  Full (Time, Place, and Person)  Thought Content:  WDL  Suicidal Thoughts:  No  Homicidal Thoughts:  No  Memory:  Immediate;   Fair Recent;   Fair Remote;   Fair  Judgement:  Impaired  Insight:  Shallow  Psychomotor Activity:  Normal  Concentration:  Fair  Recall:  [place]  Language: Fair  Akathisia:  No  Handed:  Right  AIMS (if indicated):     Assets:  Communication Skills Desire for Improvement Financial Resources/Insurance Physical  Health Resilience  ADL's:  Intact  Cognition: WNL  Sleep:  Number of Hours: 3.75   Treatment Plan Summary: Daily contact with patient to assess and evaluate symptoms and progress in treatment and Medication management   Shane Gentry is a 39 year old male with a history of schizoaffective disorder and mental retardation admitting for refusal to eat with 30 pound weight loss for which no medical reason was found.  1. Failure to thrive. The patient now eats 100% of his meals.   2. Mood and psychosis. The patient has been maintained on a combination of lithium, Depakote, Risperdal Consta. Lithium level was elevated on admission. It is within therapeutic  range now.  Ammonia level is not elevated.  3. Constipation. The patient was impacted few days ago. Abdominal x-ray indicates massive amounts of stool. Will start bowel regimen and gave magnesium citrate.  4. Dyslipidemia. The patient is on Lipitor.  5. Anxiety. We'll continue Ativan  6. Abnormal thyroid function tests. Endocrinology input is greatly appreciated. He has elevated heart rate 121 and likely requires a beta blocker.   7. Insomnia. He slept only 3 hours with the 50 mg of Restoril. Will double the dose.  8. Disposition. He'll be discharged to home. He will follow up with Dr. Rosine Door.    Jolanta Pucilowska 02/23/2015, 12:54 PM

## 2015-02-23 NOTE — Progress Notes (Signed)
D: patient seen in the milieu several time pacing back and forth.  Patient very anxious at times.  Denies any SI or HI at this time.  Patient compliant with medications this shift.  Patient ate all food and states appetite is good and energy level is appropriate.  Patient in no distress at this time.   A: support and encouragement provided.  Medications given as prescribed.  q 15 min checks performed  R: patient receptive of information but may no reinforcement.

## 2015-02-23 NOTE — Progress Notes (Signed)
Endocrine follow-up.  Labs last week notable for normal thyrotropin receptor antibody, high free T4 of 2.65.  Repeat labs this morning show free T4 not as high, now at 2.50.   A/ Hyperthyroidism, likely due to silent thyroiditis vs contrast load  P/ Hyperthyroidism is improving and does not need treatment at this time. No additional labs are needed. He should have an endocrine follow up appt as out-patient in 4 weeks. Pt or caregivers can call clinic [phone] to schedule after hosp discharge.

## 2015-02-23 NOTE — Progress Notes (Signed)
Recreation Therapy Notes  Date: 10.17.16 Time: 3:00 pm Location: Craft Room  Group Topic: Self-expression  Goal Area(s) Addresses:  Patient will be able to identify a color that represents each emotion. Patient will verbalize benefit of using art as a means of self-expression. Patient will verbalize one positive emotion experienced while participating in activity.  Behavioral Response: Attentive  Intervention: The Colors Within Me  Activity: Patients were given blank face worksheets and instructed to pick a color for each emotion they were experiencing and show how much of the emotion they were experiencing on the face.  Education: LRT educated patients on different forms of self-expression.  Education Outcome: In group clarification offered  Clinical Observations/Feedback: Patient drew a face. Patient was unable to contribute to group discussion. Patient wanted to talk about the fair.  Leonette Monarch, LRT/CTRS 02/23/2015 4:36 PM

## 2015-02-23 NOTE — BHH Group Notes (Signed)
[facility] LCSW Aftercare Discharge Planning Group Note   02/23/2015 4:24 PM  Participation Quality:  Patient attended group but is not able to participate in group discussion but did share his SMART goal is to discharge from the hospital.   Mood/Affect:  Blunted  Thoughts of Suicide:  No Will you contract for safety?   NA  Current AVH:  No  Plan for Discharge/Comments:  Discharge to group home   Keene Breath, MSW, [employer]

## 2015-02-23 NOTE — Progress Notes (Signed)
D: Patient denies SI/HI/AVH.   Patient affect is appropriate and her mood is anxious.  Patient did attend evening group. Patient visible on the milieu. Patient is preoccupied with receiving his breakfast tray. He paced the hallway throughout the night constantly asking staff how much longer he had to wait until breakfast was served. Staff offered patient a snack early morning, however patient declined. A: Support and encouragement offered. Scheduled medications given to pt. Q 15 min checks continued for patient safety. R: Patient receptive. Patient remains safe on the unit.

## 2015-02-23 NOTE — BHH Group Notes (Signed)
[facility] Group Notes:  (Nursing/MHT/Case Management/Adjunct)  Date:  02/23/2015  Time:  2:19 PM  Type of Therapy:  Psychoeducational Skills  Participation Level:  Did Not Attend   Lorane Gell 02/23/2015, 2:19 PM

## 2015-02-24 DIAGNOSIS — E0590 Thyrotoxicosis, unspecified without thyrotoxic crisis or storm: Secondary | ICD-10-CM | POA: Diagnosis present

## 2015-02-24 LAB — T3: T3 TOTAL: 194 ng/dL — AB (ref 71–180)

## 2015-02-24 MED ORDER — METOPROLOL SUCCINATE ER 25 MG PO TB24: 25.0000 mg | ORAL_TABLET | Freq: Every day | ORAL | Status: DC

## 2015-02-24 MED ORDER — RISPERIDONE MICROSPHERES 25 MG IM SUSR
50.0000 mg | INTRAMUSCULAR | Status: DC
  Administered 2015-02-24: 50 mg via INTRAMUSCULAR
  Filled 2015-02-24: qty 4

## 2015-02-24 MED ORDER — POLYETHYLENE GLYCOL 3350 17 G PO PACK
17.0000 g | PACK | Freq: Every day | ORAL | Status: AC
Start: 2015-02-24 — End: ?

## 2015-02-24 MED ORDER — DIVALPROEX SODIUM ER 500 MG PO TB24
1000.0000 mg | ORAL_TABLET | Freq: Every day | ORAL | Status: DC
Start: 2015-02-24 — End: 2015-02-24

## 2015-02-24 MED ORDER — METOPROLOL SUCCINATE ER 25 MG PO TB24
25.0000 mg | ORAL_TABLET | Freq: Every day | ORAL | Status: DC
  Administered 2015-02-24: 25 mg via ORAL
  Filled 2015-02-24: qty 1

## 2015-02-24 MED ORDER — LITHIUM CARBONATE 300 MG PO CAPS: 300.0000 mg | ORAL_CAPSULE | Freq: Every morning | ORAL | Status: DC

## 2015-02-24 MED ORDER — MONTELUKAST SODIUM 10 MG PO TABS: 10.0000 mg | ORAL_TABLET | Freq: Every day | ORAL | Status: DC

## 2015-02-24 MED ORDER — ATORVASTATIN CALCIUM 20 MG PO TABS
20.0000 mg | ORAL_TABLET | Freq: Every day | ORAL | Status: DC
Start: 2015-02-24 — End: 2015-02-24

## 2015-02-24 MED ORDER — DOCUSATE SODIUM 100 MG PO CAPS: 200.0000 mg | ORAL_CAPSULE | Freq: Two times a day (BID) | ORAL | Status: DC

## 2015-02-24 MED ORDER — TEMAZEPAM 30 MG PO CAPS: 30.0000 mg | ORAL_CAPSULE | Freq: Every day | ORAL | Status: DC

## 2015-02-24 MED ORDER — LITHIUM CARBONATE 600 MG PO CAPS: 600.0000 mg | ORAL_CAPSULE | Freq: Every day | ORAL | Status: DC

## 2015-02-24 MED ORDER — MONTELUKAST SODIUM 10 MG PO TABS: 10.0000 mg | ORAL_TABLET | Freq: Every day | ORAL | Status: AC

## 2015-02-24 MED ORDER — RISPERIDONE MICROSPHERES 50 MG IM SUSR: 50.0000 mg | INTRAMUSCULAR | Status: AC

## 2015-02-24 MED ORDER — POLYETHYLENE GLYCOL 3350 17 G PO PACK: 17.0000 g | PACK | Freq: Every day | ORAL | Status: DC

## 2015-02-24 MED ORDER — ATORVASTATIN CALCIUM 20 MG PO TABS: 20.0000 mg | ORAL_TABLET | Freq: Every day | ORAL | Status: AC

## 2015-02-24 MED ORDER — RISPERIDONE MICROSPHERES 50 MG IM SUSR: 50.0000 mg | INTRAMUSCULAR | Status: DC

## 2015-02-24 MED ORDER — DOCUSATE SODIUM 100 MG PO CAPS: 100.0000 mg | ORAL_CAPSULE | Freq: Two times a day (BID) | ORAL | Status: AC

## 2015-02-24 MED ORDER — TEMAZEPAM 30 MG PO CAPS: 30.0000 mg | ORAL_CAPSULE | Freq: Every day | ORAL | Status: AC

## 2015-02-24 MED ORDER — DIVALPROEX SODIUM 500 MG PO DR TAB: 1000.0000 mg | DELAYED_RELEASE_TABLET | Freq: Every day | ORAL | Status: DC

## 2015-02-24 NOTE — BHH Suicide Risk Assessment (Signed)
[facility] Discharge Suicide Risk Assessment   Demographic Factors:  Male  Total Time spent with patient: 30 minutes  Musculoskeletal: Strength & Muscle Tone: within normal limits Gait & Station: normal Patient leans: N/A  Psychiatric Specialty Exam: Physical Exam  Nursing note and vitals reviewed.   Review of Systems  Gastrointestinal: Positive for constipation.  All other systems reviewed and are negative.   Blood pressure 123/72, pulse 130, temperature 98.2 F (36.8 C), temperature source Oral, resp. rate 20, height 5\' 7"  (1.702 m), weight 56.7 kg (125 lb), SpO2 100 %.Body mass index is 19.57 kg/(m^2).  General Appearance: Casual  Eye Contact::  Good  Speech:  Slurred409  Volume:  Normal  Mood:  Euthymic  Affect:  Appropriate  Thought Process:  Goal Directed  Orientation:  Full (Time, Place, and Person)  Thought Content:  WDL  Suicidal Thoughts:  No  Homicidal Thoughts:  No  Memory:  Immediate;   Fair Recent;   Fair Remote;   Fair  Judgement:  Fair  Insight:  Fair  Psychomotor Activity:  Normal  Concentration:  Fair  Recall:  [employer] [place]  Language: Fair  Akathisia:  No  Handed:  Right  AIMS (if indicated):     Assets:  Communication Skills Desire for Improvement Financial Resources/Insurance Housing Physical Health Resilience  Sleep:  Number of Hours: 6.5  Cognition: WNL  ADL's:  Intact   Have you used any form of tobacco in the last 30 days? (Cigarettes, Smokeless Tobacco, Cigars, and/or Pipes): No  Has this patient used any form of tobacco in the last 30 days? (Cigarettes, Smokeless Tobacco, Cigars, and/or Pipes) No  Mental Status Per Nursing Assessment::   On Admission:     Current Mental Status by Physician: NA  Loss Factors: NA  Historical Factors: Impulsivity  Risk Reduction Factors:   Living with another person, especially a relative, Positive social support and Positive therapeutic relationship  Continued Clinical  Symptoms:  Schizophrenia:   Depressive state  Cognitive Features That Contribute To Risk:  None    Suicide Risk:  Minimal: No identifiable suicidal ideation.  Patients presenting with no risk factors but with morbid ruminations; may be classified as minimal risk based on the severity of the depressive symptoms  Principal Problem: Schizoaffective disorder, depressive type [facility]) Discharge Diagnoses:  Patient Active Problem List   Diagnosis Date Noted  . Hyperthyroidism [E05.90] 02/24/2015  . Failure to thrive in adult [R62.7] 02/19/2015  . Schizoaffective disorder, depressive type ([place]) [F25.1] 02/18/2015  . Elevated lithium level [R79.9] 02/18/2015  . Constipation [K59.00] 02/18/2015  . 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 Meatal stenosis [N35.9] 02/06/2015    Follow-up Information    Follow up with [facility].   Why:  Please DO NOT PRINT, NOT [facility] Follow up, Outpatient Medication Management   Contact information:   [address] [place], [place] [address] Phone: [phone]      Plan Of Care/Follow-up recommendations:  Activity:  As tolerated. Diet:  Low sodium heart healthy. Other:  Keep follow-up appointments.  Is patient on multiple antipsychotic therapies at discharge:  No   Has Patient had three or more failed trials of antipsychotic monotherapy by history:  No  Recommended Plan for Multiple Antipsychotic Therapies: NA    Courtenay Hirth 02/24/2015, 10:19 AM

## 2015-02-24 NOTE — Discharge Summary (Addendum)
Physician Discharge Summary Note  Patient:  Shane Gentry is an 39 y.o., male MRN:  [id number] DOB:  [birth year] Patient phone:  [phone] (home) Patient address:   [place] [address],  Total Time spent with patient: 30 minutes  Date of Admission:  02/19/2015 Date of Discharge: 02/24/2015  Reason for Admission:  Agitated behavior.   Identifying data. Shane Gentry is a 39 year old male with a history of schizoaffective disorder and mental retardation.  Chief complaint. The patient is unable to state.  History of present illness. Information was obtained from the chart and the patient. He is a very poor historian. Reportedly the patient has a long history of schizoaffective disorder. He has been maintained on a combination of lithium, Depakote, and Risperdal Consta history primary provider Dr. Rosine Door. The patient has not been hospitalized for the last 5 years at least." So ago the patient started refusing regular food at his abdomen. That he eats out all the time. Poor oral intake to 30 pound weight loss. This was investigated by his primary care provider, gastroenterologist, and emergency room doctors. No abnormalities were found in his labs or CT scans. The patient has a history of constipation. He was contacted few days ago. There are still massive amounts of stool in his abdomen recent CT scan. The patient does not have anything to report except that he likes it here and wants to stay. The patient apparently has a very good relationship with caretakers. At present before the group home stay with the patient in the emergency room which is appropriate but uncommon. Today he refused to talk to the group home owner who tried to call him on the phone. The patient denies any symptoms of depression, anxiety, or psychosis. There are no symptoms suggestive of bipolar mania. He does not use alcohol or drugs. He has been compliant with his treatment as indicated by therapeutic level of  Depakote and elevated level of lithium.  Past psychiatric history. We know from the group home's that the patient has not been hospitalized past 5 years but that there were hospitalizations prior to his admission to the group home. We do not know his history of suicide. We do not know his history of medication trials.   Family psychiatric history. Unknown.  Social history. At the patient is disabled most likely from his mental illness and cognitive impairment. He has been in his current group home for 5 years. He has Medicaid. He is an incompetent adult.   Principal Problem: Schizoaffective disorder, depressive type [facility]) Discharge Diagnoses: Patient Active Problem List   Diagnosis Date Noted  . Hyperthyroidism [E05.90] 02/24/2015  . Failure to thrive in adult [R62.7] 02/19/2015  . Schizoaffective disorder, depressive type ([place]) [F25.1] 02/18/2015  . Elevated lithium level [R79.9] 02/18/2015  . Constipation [K59.00] 02/18/2015  . 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 Meatal stenosis [N35.9] 02/06/2015    Musculoskeletal: Strength & Muscle Tone: within normal limits and spastic Gait & Station: normal Patient leans: N/A  Psychiatric Specialty Exam: Physical Exam  Nursing note and vitals reviewed.   Review of Systems  Gastrointestinal: Positive for constipation.  All other systems reviewed and are negative.   Blood pressure 123/72, pulse 130, temperature 98.2 F (36.8 C), temperature source Oral, resp. rate 20, height 5\' 7"  (1.702 m), weight 56.7 kg (125 lb), SpO2 100 %.Body mass index is 19.57 kg/(m^2).  See SRA.  Sleep:  Number of Hours: 6.5   Have you used any form of tobacco in the last 30 days? (Cigarettes, Smokeless Tobacco, Cigars, and/or Pipes): No  Has this patient used any form of tobacco in the last 30 days? (Cigarettes, Smokeless Tobacco, Cigars, and/or Pipes) No  Past Medical  History:  Past Medical History  Diagnosis Date  . Seasonal allergies   . Anxiety   . Depression   . HLD (hyperlipidemia)   . Prostate cancer ([place])   . Schizoaffective disorder ([place])   . ADD (attention deficit disorder)   . Asthma    History reviewed. No pertinent past surgical history. Family History: History reviewed. No pertinent family history. Social History:  History  Alcohol Use No     History  Drug Use No    Social History   Social History  . Marital Status: Single    Spouse Name: N/A  . Number of Children: N/A  . Years of Education: N/A   Social History Main Topics  . Smoking status: Never Smoker   . Smokeless tobacco: Never Used  . Alcohol Use: No  . Drug Use: No  . Sexual Activity: Not Currently   Other Topics Concern  . None   Social History Narrative    Past Psychiatric History: Hospitalizations:  Outpatient Care:  Substance Abuse Care:  Self-Mutilation:  Suicidal Attempts:  Violent Behaviors:   Risk to Self: Is patient at risk for suicide?: Yes Risk to Others:   Prior Inpatient Therapy:   Prior Outpatient Therapy:    Level of Care:  OP  Hospital Course:    Shane Gentry is a 39 year old male with a history of schizoaffective disorder and mental retardation admitting for refusal to eat with 30 pound weight loss for which no medical reason was found.  1. Failure to thrive. The patient now eats 100% of his meals.   2. Mood and psychosis. The patient has been maintained on a combination of lithium, Depakote, Risperdal Consta. Lithium level was elevated on admission. Lithium dose was lowered to 600 mg at bedtime. Lithium level is within therapeutic range now. Ammonia level is not elevated. He was given 50 mg of Risperdal Consta on 02/24/2015.  3. Constipation. The patient was impacted few days prior to admission. Abdominal x-ray indicated massive amounts of stool. We started bowel regimen.   4. Dyslipidemia. The patient is on Lipitor.  5.  Anxiety. We n also LE she well continued Ativan. There were no episodes of anxiety.  6. Abnormal thyroid function tests. Endocrinology input is greatly appreciated. Hyperthyroidism is improving and does not need treatment at this time. No additional labs are needed. He should have an endocrine follow up appt as out-patient in 4 weeks. Pt or caregivers can call clinic [phone] to schedule after hosp discharge. Metoprolol was started to address tachycardia.   7. Insomnia. He slept better with 30 mg of Restoril.  8. Disposition. He was discharged to his group home. He will follow up with Dr. Rosine Door.  Consults:  endocrinology.  Significant Diagnostic Studies:  other:  Thyroid function tests  Discharge Vitals:   Blood pressure 123/72, pulse 130, temperature 98.2 F (36.8 C), temperature source Oral, resp. rate 20, height 5\' 7"  (1.702 m), weight 56.7 kg (125 lb), SpO2 100 %. Body mass index is 19.57 kg/(m^2). Lab Results:   Results for orders placed or performed during the hospital encounter of 02/19/15 (from the past 72 hour(s))  T3     Status: Abnormal   Collection  Time: 02/23/15  6:40 AM  Result Value Ref Range   T3, Total 194 (H) 71 - 180 ng/dL    Comment: (NOTE) Performed At: [facility] [place], [place] [id number] Lindon Romp MD JS:[id number]   T4, free     Status: Abnormal   Collection Time: 02/23/15  6:40 AM  Result Value Ref Range   Free T4 2.50 (H) 0.61 - 1.12 ng/dL    Physical Findings: AIMS: Facial and Oral Movements Muscles of Facial Expression: None, normal Lips and Perioral Area: None, normal Jaw: None, normal Tongue: None, normal,Extremity Movements Upper (arms, wrists, hands, fingers): None, normal Lower (legs, knees, ankles, toes): None, normal, Trunk Movements Neck, shoulders, hips: None, normal, Overall Severity Severity of abnormal movements (highest score from questions above): None, normal Incapacitation due to abnormal  movements: None, normal Patient's awareness of abnormal movements (rate only patient's report): No Awareness, Dental Status Current problems with teeth and/or dentures?: No Does patient usually wear dentures?: No  CIWA:  CIWA-Ar Total: 0 COWS:      See Psychiatric Specialty Exam and Suicide Risk Assessment completed by Attending Physician prior to discharge.  Discharge destination:  Home  Is patient on multiple antipsychotic therapies at discharge:  No   Has Patient had three or more failed trials of antipsychotic monotherapy by history:  No    Recommended Plan for Multiple Antipsychotic Therapies: NA  Discharge Instructions    Diet - low sodium heart healthy    Complete by:  As directed      Increase activity slowly    Complete by:  As directed             Medication List    TAKE these medications      Indication   atorvastatin 20 MG tablet  Commonly known as:  LIPITOR  Take 1 tablet (20 mg total) by mouth daily at 6 PM.   Indication:  Elevation of Both Cholesterol and Triglycerides in Blood     divalproex 500 MG DR tablet  Commonly known as:  DEPAKOTE  Take 2 tablets (1,000 mg total) by mouth at bedtime.   Indication:  Schizophrenia     docusate sodium 100 MG capsule  Commonly known as:  COLACE  Take 1 capsule (100 mg total) by mouth 2 (two) times daily.   Indication:  Constipation     guanFACINE 2 MG tablet  Commonly known as:  TENEX  Take 2 mg by mouth 2 (two) times daily.      lithium 600 MG capsule  Take 1 capsule (600 mg total) by mouth at bedtime.   Indication:  Schizoaffective Disorder     LORazepam 2 MG tablet  Commonly known as:  ATIVAN  Take 2 mg by mouth 2 (two) times daily.      metoprolol succinate 25 MG 24 hr tablet  Commonly known as:  TOPROL-XL  Take 1 tablet (25 mg total) by mouth daily.   Indication:  Overactive Thyroid causing Life-Threatening Symptoms     montelukast 10 MG tablet  Commonly known as:  SINGULAIR  Take 1 tablet (10  mg total) by mouth at bedtime.   Indication:  Hayfever     polyethylene glycol packet  Commonly known as:  MIRALAX / GLYCOLAX  Take 17 g by mouth daily.      risperiDONE microspheres 50 MG injection  Commonly known as:  RISPERDAL CONSTA  Inject 2 mLs (50 mg total) into the muscle every 14 (fourteen) days.  Indication:  Schizophrenia     temazepam 30 MG capsule  Commonly known as:  RESTORIL  Take 1 capsule (30 mg total) by mouth at bedtime.   Indication:  Trouble Sleeping           Follow-up Information    Follow up with [facility].   Why:  Please Call to schedule, unable to get appointment prior to discharge. Hospital Follow up, Outpatient Medication Management   Contact information:   [address] [place], [place] [address] Phone: [phone]       Follow-up recommendations:  Activity:  As tolerated. Diet:  Low sodium heart healthy. Other:  Keep follow-up appointments.  Comments:    Total Discharge Time: 35 min.  Signed: Brysyn Brandenberger 02/24/2015, 12:30 PM

## 2015-02-24 NOTE — Progress Notes (Signed)
Group Home in to pick up patient at this time. MD and LCSW working on [employer], med lists and scripts. Verbalized understanding rt recommended discharge plan of care. Safety maintained. Patient to discharge at this time.

## 2015-02-24 NOTE — Discharge Summary (Deleted)
Physician Discharge Summary Note  Patient:  Shane Gentry is an 39 y.o., male MRN:  [id number] DOB:  1975-11-16 Patient phone:  [phone] (home)  Patient address:   [place] [address],  Total Time spent with patient: 30 minutes  Date of Admission:  02/19/2015 Date of Discharge: 02/24/2015  Reason for Admission:  Failure to thrive.  Identifying data. Mr. Shane Gentry is a 40 year old male with a history of schizoaffective disorder and mental retardation.  Chief complaint. The patient is unable to state.  History of present illness. Information was obtained from the chart and the patient. He is a very poor historian. Reportedly the patient has a long history of schizoaffective disorder. He has been maintained on a combination of lithium, Depakote, and Risperdal Consta history primary provider Dr. Rosine Gentry. The patient has not been hospitalized for the last 5 years at least." So ago the patient started refusing regular food at his abdomen. That he eats out all the time. Poor oral intake to 30 pound weight loss. This was investigated by his primary care provider, gastroenterologist, and emergency room doctors. No abnormalities were found in his labs or CT scans. The patient has a history of constipation. He was contacted few days ago. There are still massive amounts of stool in his abdomen recent CT scan. The patient does not have anything to report except that he likes it here and wants to stay. The patient apparently has a very good relationship with caretakers. At present before the group home stay with the patient in the emergency room which is appropriate but uncommon. Today he refused to talk to the group home owner who tried to call him on the phone. The patient denies any symptoms of depression, anxiety, or psychosis. There are no symptoms suggestive of bipolar mania. He does not use alcohol or drugs. He has been compliant with his treatment as indicated by therapeutic level of  Depakote and elevated level of lithium.  Past psychiatric history. We know from the group home that the patient has not been hospitalized past 5 years but that there were hospitalizations prior to his admission to the group home. We do not know his history of suicide. We do not know his history of medication trials.   Family psychiatric history. Unknown.  Social history. At the patient is disabled most likely from his mental illness and cognitive impairment. He has been in his current group home for 5 years. He has Medicaid. He is an incompetent adult.   Principal Problem: Schizoaffective disorder, depressive type [facility]) Discharge Diagnoses: Patient Active Problem List   Diagnosis Date Noted  . Hyperthyroidism [E05.90] 02/24/2015  . Failure to thrive in adult [R62.7] 02/19/2015  . Schizoaffective disorder, depressive type ([place]) [F25.1] 02/18/2015  . Elevated lithium level [R79.9] 02/18/2015  . Constipation [K59.00] 02/18/2015  . 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 Meatal stenosis [N35.9] 02/06/2015    Musculoskeletal: Strength & Muscle Tone: within normal limits Gait & Station: normal Patient leans: N/A  Psychiatric Specialty Exam: Physical Exam  Nursing note and vitals reviewed.   Review of Systems  Constitutional: Positive for weight loss.  Cardiovascular: Positive for palpitations.  Gastrointestinal: Positive for constipation.  All other systems reviewed and are negative.   Blood pressure 123/72, pulse 130, temperature 98.2 F (36.8 C), temperature source Oral, resp. rate 20, height 5\' 7"  (1.702 m), weight 56.7 kg (125 lb), SpO2 100 %.Body mass index is 19.57 kg/(m^2).  See SRA.  Sleep:  Number of Hours: 6.5   Have you used any form of tobacco in the last 30 days? (Cigarettes, Smokeless Tobacco, Cigars, and/or Pipes): No  Has this patient used any form of tobacco in the last 30 days?  (Cigarettes, Smokeless Tobacco, Cigars, and/or Pipes) No  Past Medical History:  Past Medical History  Diagnosis Date  . Seasonal allergies   . Anxiety   . Depression   . HLD (hyperlipidemia)   . Prostate cancer ([place])   . Schizoaffective disorder ([place])   . ADD (attention deficit disorder)   . Asthma    History reviewed. No pertinent past surgical history. Family History: History reviewed. No pertinent family history. Social History:  History  Alcohol Use No     History  Drug Use No    Social History   Social History  . Marital Status: Single    Spouse Name: N/A  . Number of Children: N/A  . Years of Education: N/A   Social History Main Topics  . Smoking status: Never Smoker   . Smokeless tobacco: Never Used  . Alcohol Use: No  . Drug Use: No  . Sexual Activity: Not Currently   Other Topics Concern  . None   Social History Narrative    Past Psychiatric History: Hospitalizations:  Outpatient Care:  Substance Abuse Care:  Self-Mutilation:  Suicidal Attempts:  Violent Behaviors:   Risk to Self: Is patient at risk for suicide?: Yes Risk to Others:   Prior Inpatient Therapy:   Prior Outpatient Therapy:    Level of Care:  OP  Hospital Course:    Mr. Shane Gentry is a 39 year old male with a history of schizoaffective disorder and mental retardation admitting for refusal to eat with 30 pound weight loss for which no medical reason was found.  1. Failure to thrive. The patient now eats 100% of his meals.   2. Mood and psychosis. The patient has been maintained on a combination of lithium, Depakote, Risperdal Consta. Lithium level was elevated on admission. Lithium dose was lowered to 600 mg at bedtime. Lithium level is within therapeutic range now. Ammonia level is not elevated. He was given 50 mg of Risperdal Consta on 02/24/2015.  3. Constipation. The patient was impacted few days prior to admission. Abdominal x-ray indicated massive amounts of stool. We started  bowel regimen.   4. Dyslipidemia. The patient is on Lipitor.  5. Anxiety. We discontinued Ativan. There were no episodes of anxiety.  6. Abnormal thyroid function tests. Endocrinology input is greatly appreciated. Hyperthyroidism is improving and does not need treatment at this time. No additional labs are needed. He should have an endocrine follow up appt as out-patient in 4 weeks. Pt or caregivers can call clinic [phone] to schedule after hosp discharge. Metoprolol was started to address tachycardia.    7. Insomnia. He slept better with 30 mg of Restoril.  8. Disposition. He was discharged to his group home. He will follow up with Dr. Rosine Gentry.  Consults:  Endocrinology.  Significant Diagnostic Studies:  labs: Thyroid function tests.  Discharge Vitals:   Blood pressure 123/72, pulse 130, temperature 98.2 F (36.8 C), temperature source Oral, resp. rate 20, height 5\' 7"  (1.702 m), weight 56.7 kg (125 lb), SpO2 100 %. Body mass index is 19.57 kg/(m^2). Lab Results:   Results for orders placed or performed during the hospital encounter of 02/19/15 (from the past 72 hour(s))  T3     Status: Abnormal   Collection Time: 02/23/15  6:40 AM  Result Value Ref Range   T3, Total 194 (H) 71 - 180 ng/dL    Comment: (NOTE) Performed At: [facility] [place], [place] [id number] Lindon Romp MD [id number]   T4, free     Status: Abnormal   Collection Time: 02/23/15  6:40 AM  Result Value Ref Range   Free T4 2.50 (H) 0.61 - 1.12 ng/dL    Physical Findings: AIMS: Facial and Oral Movements Muscles of Facial Expression: None, normal Lips and Perioral Area: None, normal Jaw: None, normal Tongue: None, normal,Extremity Movements Upper (arms, wrists, hands, fingers): None, normal Lower (legs, knees, ankles, toes): None, normal, Trunk Movements Neck, shoulders, hips: None, normal, Overall Severity Severity of abnormal movements (highest score from questions  above): None, normal Incapacitation due to abnormal movements: None, normal Patient's awareness of abnormal movements (rate only patient's report): No Awareness, Dental Status Current problems with teeth and/or dentures?: No Does patient usually wear dentures?: No  CIWA:  CIWA-Ar Total: 0 COWS:      See Psychiatric Specialty Exam and Suicide Risk Assessment completed by Attending Physician prior to discharge.  Discharge destination:  Home  Is patient on multiple antipsychotic therapies at discharge:  No   Has Patient had three or more failed trials of antipsychotic monotherapy by history:  No    Recommended Plan for Multiple Antipsychotic Therapies: NA  Discharge Instructions    Diet - low sodium heart healthy    Complete by:  As directed      Increase activity slowly    Complete by:  As directed             Medication List    STOP taking these medications        divalproex 500 MG DR tablet  Commonly known as:  DEPAKOTE  Replaced by:  divalproex 500 MG 24 hr tablet     guanFACINE 2 MG tablet  Commonly known as:  TENEX     LORazepam 2 MG tablet  Commonly known as:  ATIVAN      TAKE these medications      Indication   atorvastatin 20 MG tablet  Commonly known as:  LIPITOR  Take 1 tablet (20 mg total) by mouth daily at 6 PM.   Indication:  Elevation of Both Cholesterol and Triglycerides in Blood     divalproex 500 MG 24 hr tablet  Commonly known as:  DEPAKOTE ER  Take 2 tablets (1,000 mg total) by mouth at bedtime.   Indication:  Rapidly Alternating Manic-Depressive Psychosis     docusate sodium 100 MG capsule  Commonly known as:  COLACE  Take 2 capsules (200 mg total) by mouth 2 (two) times daily.   Indication:  Constipation     lithium 600 MG capsule  Take 1 capsule (600 mg total) by mouth at bedtime.   Indication:  Schizoaffective Disorder     lithium carbonate 300 MG capsule  Take 1 capsule (300 mg total) by mouth every morning.   Indication:   Schizoaffective Disorder     montelukast 10 MG tablet  Commonly known as:  SINGULAIR  Take 1 tablet (10 mg total) by mouth at bedtime.   Indication:  Hayfever     polyethylene glycol packet  Commonly known as:  MIRALAX / GLYCOLAX  Take 17 g by mouth daily.      temazepam 30 MG capsule  Commonly known as:  RESTORIL  Take 1 capsule (30 mg total) by mouth at bedtime.  Indication:  Trouble Sleeping      ASK your doctor about these medications      Indication   risperiDONE microspheres 50 MG injection  Commonly known as:  RISPERDAL CONSTA  Inject 50 mg into the muscle every 14 (fourteen) days.            Follow-up Information    Follow up with [facility].   Why:  Please DO NOT PRINT, NOT [facility] Follow up, Outpatient Medication Management   Contact information:   [address] [place], [place] [address] Phone: [phone]      Follow-up recommendations:  Activity:  As tolerated. Diet:  Low sodium heart healthy. Other:  Keep follow-up appointments.  Comments:    Total Discharge Time: 35 min.  Signed: Ashni Lonzo 02/24/2015, 10:22 AM

## 2015-02-24 NOTE — Progress Notes (Signed)
Patient with appropriate affect and cooperative behavior with meals, meds and plan of care. No SI/HI at this time. Fair adls, good appetite. Patient performs adls with set up and encouragement. Risperdol IM administered as ordered. Patient to discharge today when discharge plan and transportation in place. Safety maintained.

## 2015-02-24 NOTE — B[facility] Counselor (Signed)
Adult Comprehensive Assessment  Patient ID: Shane Gentry, male   DOB: 1975/08/28, 39 y.o.   MRN: [id number]  Information Source: Information source: Patient  Current Stressors:  Educational / Learning stressors: Pt developmentally disabled Employment / Job issues: on disability Social relationships: minimal support  Living/Environment/Situation:  Living Arrangements: Group Home Living conditions (as described by patient or guardian): [place] How long has patient lived in current situation?: Many years according to guardian, she couldn't say exactly how long What is atmosphere in current home: Comfortable, Supportive  Family History:  Marital status: Single  Childhood History:  By whom was/is the patient raised?: Mother  Education:  Learning disability?: Yes  Employment/Work Situation:   Employment situation: On disability  Pensions consultant:   Financial resources: Teacher, early years/pre Does patient have a Programmer, applications or guardian?: Yes Name of representative payee or guardian: Ruth  Alcohol/Substance Abuse:   What has been your use of drugs/alcohol within the last 12 months?: None Reported Alcohol/Substance Abuse Treatment Hx: Denies past history  Social Support System:   Heritage manager System: Poor Describe Community Support System: Guardian and group home staff  Leisure/Recreation:   Leisure and Hobbies: very focused on food Pt pleasant and cooperative, although has difficulty expressing himself. Strengths/Needs:      Discharge Plan:   Does patient have access to transportation?: Yes (Group home) Will patient be returning to same living situation after discharge?: Yes Currently receiving community mental health services: Yes (From Whom) (Dr. Rosine Door, [facility]) If no, would patient like referral for services when discharged?: No Does patient have financial barriers related to discharge  medications?: No  Summary/Recommendations:    Pt is 39 yo male who lives in [facility] in [place].  He has a guardian from [place] in [place].  He has been in this group home for a long time and has been doing relatively well but has recently lost a significant amount of weight, approximately 30lbs.  Pt stopped eating at group home. While on the unit Pt will have the opportunity to participate in groups and therapeutic milieu. Pt will have medications managed and assistance with appropriate discharge plan.  SW recommends Pt returning to group home once stable.  Pt is followed by Dr. Rosine Door at [facility].  August Saucer 02/24/2015, MSW, LCSW

## 2015-03-13 ENCOUNTER — Encounter: Payer: Self-pay | Admitting: *Deleted

## 2015-03-16 ENCOUNTER — Ambulatory Visit: Admission: RE | Admit: 2015-03-16 | Payer: Medicare Other | Source: Ambulatory Visit | Admitting: Gastroenterology

## 2015-03-16 ENCOUNTER — Encounter: Admission: RE | Payer: Self-pay | Source: Ambulatory Visit

## 2015-03-16 ENCOUNTER — Emergency Department (HOSPITAL_COMMUNITY)
Admission: EM | Admit: 2015-03-16 | Discharge: 2015-03-16 | Disposition: A | Discharge status: Home / Self Care | Payer: Medicare Other | Attending: Emergency Medicine | Admitting: Emergency Medicine

## 2015-03-16 ENCOUNTER — Encounter (HOSPITAL_COMMUNITY): Payer: Self-pay | Admitting: Emergency Medicine

## 2015-03-16 ENCOUNTER — Emergency Department (HOSPITAL_COMMUNITY): Payer: Medicare Other

## 2015-03-16 DIAGNOSIS — S4991XA Unspecified injury of right shoulder and upper arm, initial encounter: Secondary | ICD-10-CM

## 2015-03-16 DIAGNOSIS — Y9389 Activity, other specified: Secondary | ICD-10-CM | POA: Diagnosis not present

## 2015-03-16 DIAGNOSIS — F329 Major depressive disorder, single episode, unspecified: Secondary | ICD-10-CM | POA: Diagnosis not present

## 2015-03-16 DIAGNOSIS — F419 Anxiety disorder, unspecified: Secondary | ICD-10-CM | POA: Insufficient documentation

## 2015-03-16 DIAGNOSIS — Z8546 Personal history of malignant neoplasm of prostate: Secondary | ICD-10-CM | POA: Insufficient documentation

## 2015-03-16 DIAGNOSIS — Z79899 Other long term (current) drug therapy: Secondary | ICD-10-CM | POA: Insufficient documentation

## 2015-03-16 DIAGNOSIS — F909 Attention-deficit hyperactivity disorder, unspecified type: Secondary | ICD-10-CM | POA: Insufficient documentation

## 2015-03-16 DIAGNOSIS — E785 Hyperlipidemia, unspecified: Secondary | ICD-10-CM | POA: Insufficient documentation

## 2015-03-16 DIAGNOSIS — J45909 Unspecified asthma, uncomplicated: Secondary | ICD-10-CM | POA: Insufficient documentation

## 2015-03-16 DIAGNOSIS — F259 Schizoaffective disorder, unspecified: Secondary | ICD-10-CM | POA: Diagnosis not present

## 2015-03-16 DIAGNOSIS — Y998 Other external cause status: Secondary | ICD-10-CM | POA: Diagnosis not present

## 2015-03-16 DIAGNOSIS — Y9289 Other specified places as the place of occurrence of the external cause: Secondary | ICD-10-CM | POA: Diagnosis not present

## 2015-03-16 SURGERY — ESOPHAGOGASTRODUODENOSCOPY (EGD) WITH PROPOFOL
Anesthesia: General
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
## 2015-03-16 NOTE — ED Notes (Signed)
Bed: ZY60 Expected date:  Expected time:  Means of arrival:  Comments: FAST TRACK IS READY! BRING 'EM BACK :)

## 2015-03-16 NOTE — Discharge Instructions (Signed)
Follow up with PCP as needed or if symptoms worsen. Apply ice to affected area. Take ibuprofen as needed for pain. Return to the ED if you experience numbness/tingling in right arm, inability to move arm, or discoloration of extremity.
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
## 2015-04-15 ENCOUNTER — Encounter: Payer: Self-pay | Admitting: *Deleted

## 2015-04-16 ENCOUNTER — Encounter
Admission: RE | Disposition: A | Discharge status: Home / Self Care | Payer: Self-pay | Source: Ambulatory Visit | Attending: Gastroenterology

## 2015-04-16 ENCOUNTER — Encounter: Payer: Self-pay | Admitting: Anesthesiology

## 2015-04-16 ENCOUNTER — Ambulatory Visit: Payer: Medicare Other | Admitting: Anesthesiology

## 2015-04-16 ENCOUNTER — Ambulatory Visit
Admission: RE | Admit: 2015-04-16 | Discharge: 2015-04-16 | Disposition: A | Discharge status: Home / Self Care | Payer: Medicare Other | Source: Ambulatory Visit | Attending: Gastroenterology | Admitting: Gastroenterology

## 2015-04-16 DIAGNOSIS — K2960 Other gastritis without bleeding: Secondary | ICD-10-CM | POA: Diagnosis not present

## 2015-04-16 DIAGNOSIS — R1084 Generalized abdominal pain: Secondary | ICD-10-CM | POA: Diagnosis not present

## 2015-04-16 DIAGNOSIS — R1310 Dysphagia, unspecified: Secondary | ICD-10-CM | POA: Diagnosis present

## 2015-04-16 DIAGNOSIS — Z79899 Other long term (current) drug therapy: Secondary | ICD-10-CM | POA: Diagnosis not present

## 2015-04-16 DIAGNOSIS — E785 Hyperlipidemia, unspecified: Secondary | ICD-10-CM | POA: Diagnosis not present

## 2015-04-16 DIAGNOSIS — F419 Anxiety disorder, unspecified: Secondary | ICD-10-CM | POA: Insufficient documentation

## 2015-04-16 DIAGNOSIS — Z882 Allergy status to sulfonamides status: Secondary | ICD-10-CM | POA: Insufficient documentation

## 2015-04-16 DIAGNOSIS — Z8546 Personal history of malignant neoplasm of prostate: Secondary | ICD-10-CM | POA: Diagnosis not present

## 2015-04-16 DIAGNOSIS — R634 Abnormal weight loss: Secondary | ICD-10-CM | POA: Insufficient documentation

## 2015-04-16 DIAGNOSIS — F329 Major depressive disorder, single episode, unspecified: Secondary | ICD-10-CM | POA: Diagnosis not present

## 2015-04-16 DIAGNOSIS — K210 Gastro-esophageal reflux disease with esophagitis: Secondary | ICD-10-CM | POA: Insufficient documentation

## 2015-04-16 DIAGNOSIS — F259 Schizoaffective disorder, unspecified: Secondary | ICD-10-CM | POA: Insufficient documentation

## 2015-04-16 DIAGNOSIS — F988 Other specified behavioral and emotional disorders with onset usually occurring in childhood and adolescence: Secondary | ICD-10-CM | POA: Insufficient documentation

## 2015-04-16 DIAGNOSIS — J45909 Unspecified asthma, uncomplicated: Secondary | ICD-10-CM | POA: Diagnosis not present

## 2015-04-16 HISTORY — PX: ESOPHAGOGASTRODUODENOSCOPY (EGD) WITH PROPOFOL: SHX5813

## 2015-04-16 SURGERY — ESOPHAGOGASTRODUODENOSCOPY (EGD) WITH PROPOFOL
Anesthesia: General

## 2015-04-16 MED ORDER — IPRATROPIUM-ALBUTEROL 0.5-2.5 (3) MG/3ML IN SOLN
RESPIRATORY_TRACT | Status: AC
  Administered 2015-04-16: 3 mL
  Filled 2015-04-16: qty 3

## 2015-04-16 MED ORDER — GLYCOPYRROLATE 0.2 MG/ML IJ SOLN
INTRAMUSCULAR | Status: DC | PRN
  Administered 2015-04-16: 0.2 mg via INTRAVENOUS

## 2015-04-16 MED ORDER — PROPOFOL 10 MG/ML IV BOLUS
INTRAVENOUS | Status: DC | PRN
  Administered 2015-04-16: 70 mg via INTRAVENOUS
  Administered 2015-04-16: 40 mg via INTRAVENOUS
  Administered 2015-04-16 (×2): 30 mg via INTRAVENOUS

## 2015-04-16 MED ORDER — LIDOCAINE HCL (CARDIAC) 20 MG/ML IV SOLN
INTRAVENOUS | Status: DC | PRN
  Administered 2015-04-16: 100 mg via INTRAVENOUS

## 2015-04-16 MED ORDER — METOPROLOL SUCCINATE ER 25 MG PO TB24
ORAL_TABLET | ORAL | Status: AC
  Administered 2015-04-16: 25 mg
  Filled 2015-04-16: qty 1

## 2015-04-16 MED ORDER — SODIUM CHLORIDE 0.9 % IV SOLN
INTRAVENOUS | Status: DC
  Administered 2015-04-16: 1000 mL via INTRAVENOUS

## 2015-04-16 NOTE — Transfer of Care (Signed)
Immediate Anesthesia Transfer of Care Note  Patient: Shane Gentry  Procedure(s) Performed: Procedure(s): ESOPHAGOGASTRODUODENOSCOPY (EGD) WITH PROPOFOL (N/A)  Patient Location: PACU  Anesthesia Type:General  Level of Consciousness: sedated  Airway & Oxygen Therapy: Patient Spontanous Breathing and Patient connected to nasal cannula oxygen  Post-op Assessment: Report given to RN and Post -op Vital signs reviewed and stable  Post vital signs: Reviewed and stable  Last Vitals:  Filed Vitals:   04/16/15 0938 04/16/15 1122  BP: 162/102 95/60  Pulse: 70 59  Temp: 35.6 C 36.2 C  Resp: 16 16    Complications: No apparent anesthesia complications

## 2015-04-16 NOTE — Anesthesia Preprocedure Evaluation (Signed)
Anesthesia Evaluation  Patient identified by MRN, date of birth, ID band Patient awake    Reviewed: Allergy & Precautions, H&P , NPO status , Patient's Chart, lab work & pertinent test results  Airway Mallampati: III  TM Distance: >3 FB Neck ROM: full    Dental  (+) Poor Dentition, Chipped, Missing   Pulmonary neg shortness of breath, asthma ,    Pulmonary exam normal breath sounds clear to auscultation       Cardiovascular Exercise Tolerance: Good negative cardio ROS Normal cardiovascular exam Rhythm:regular Rate:Normal     Neuro/Psych PSYCHIATRIC DISORDERS Anxiety Depression Schizophrenia negative neurological ROS  negative psych ROS   GI/Hepatic negative GI ROS, Neg liver ROS,   Endo/Other  Hyperthyroidism   Renal/GU negative Renal ROS  negative genitourinary   Musculoskeletal   Abdominal   Peds  Hematology negative hematology ROS (+)   Anesthesia Other Findings Past Medical History:   Seasonal allergies                                           Anxiety                                                      Depression                                                   HLD (hyperlipidemia)                                         Prostate cancer (HCC)                                        Schizoaffective disorder (HCC)                               ADD (attention deficit disorder)                             Asthma                                                      Past Surgical History:   NO PAST SURGERIES                                            BMI    Body Mass Index   21.96 kg/m 2      Reproductive/Obstetrics negative OB ROS  Anesthesia Physical Anesthesia Plan  ASA: III  Anesthesia Plan: General   Post-op Pain Management:    Induction:   Airway Management Planned:   Additional Equipment:   Intra-op Plan:   Post-operative Plan:    Informed Consent: I have reviewed the patients History and Physical, chart, labs and discussed the procedure including the risks, benefits and alternatives for the proposed anesthesia with the patient or authorized representative who has indicated his/her understanding and acceptance.   Dental Advisory Given  Plan Discussed with: Anesthesiologist, CRNA and Surgeon  Anesthesia Plan Comments:         Anesthesia Quick Evaluation

## 2015-04-16 NOTE — Anesthesia Postprocedure Evaluation (Signed)
Anesthesia Post Note  Patient: [name]  Procedure(s) Performed: Procedure(s) (LRB): ESOPHAGOGASTRODUODENOSCOPY (EGD) WITH PROPOFOL (N/A)  Patient location during evaluation: Endoscopy Anesthesia Type: General Level of consciousness: awake and alert Pain management: pain level controlled Vital Signs Assessment: post-procedure vital signs reviewed and stable Respiratory status: spontaneous breathing, nonlabored ventilation, respiratory function stable and patient connected to nasal cannula oxygen Cardiovascular status: blood pressure returned to baseline and stable Postop Assessment: no signs of nausea or vomiting Anesthetic complications: no    Last Vitals:  Filed Vitals:   04/16/15 1200 04/16/15 1210  BP: 118/79   Pulse: 69 74  Temp:    Resp: 14 16    Last Pain: There were no vitals filed for this visit.               Precious Haws Saleema Weppler

## 2015-04-16 NOTE — Op Note (Signed)
[facility] Patient Name: Shane Gentry Procedure Date: 04/16/2015 11:01 AM MRN: [id number] Account #: [id number] Date of Birth: 1975/09/22 Admit Type: Outpatient Age: 39 Room: [facility] ENDO ROOM 3 Gender: Male Note Status: Finalized Procedure:         Upper GI endoscopy Indications:       Generalized abdominal pain, Weight loss Patient Profile:   This is a 38 year old male. Providers:         Gerrit Heck. Rayann Heman, MD Referring MD:      Mikeal Hawthorne. Brynda Greathouse, MD (Referring MD) Medicines:         Propofol per Anesthesia Complications:     No immediate complications. Procedure:         Pre-Anesthesia Assessment:                    - Prior to the procedure, a History and Physical was                     performed, and patient medications, allergies and                     sensitivities were reviewed. The patient's tolerance of                     previous anesthesia was reviewed.                    After obtaining informed consent, the endoscope was passed                     under direct vision. Throughout the procedure, the                     patient's blood pressure, pulse, and oxygen saturations                     were monitored continuously. The Endoscope was introduced                     through the mouth, and advanced to the second part of                     duodenum. The upper GI endoscopy was accomplished without                     difficulty. The patient tolerated the procedure well. Findings:      LA Grade A (one or more mucosal breaks less than 5 mm, not extending       between tops of 2 mucosal folds) esophagitis with no bleeding was found       in the lower third of the esophagus. Biopsies were taken with a cold       forceps for histology.      Localized moderate inflammation with hemorrhage characterized by       erythema and granularity was found in the cardia. Biopsies were taken       with a cold forceps for histology.      The  examined duodenum was normal. Impression:        - LA Grade A reflux esophagitis. Biopsied.                    - Gastritis with hemorrhage. Biopsied.                    -  Normal examined duodenum. Recommendation:    - Observe patient in GI recovery unit.                    - Resume regular diet.                    - Continue present medications.                    - Use Prilosec (omeprazole) 40 mg PO daily.                    - Await pathology results.                    - Call GI clinic if wt loss continues. He was asking about                     getting food multiple times today. Suspect the weight loss                     is situational and not organic pathology.                    - Return to referring physician.                    - The findings and recommendations were discussed with the                     patient.                    - The findings and recommendations were discussed with the                     patient's family. Procedure Code(s): --- Professional ---                    [phone], Esophagogastroduodenoscopy, flexible, transoral;                     with biopsy, single or multiple Diagnosis Code(s): --- Professional ---                    K21.0, Gastro-esophageal reflux disease with esophagitis                    K29.71, Gastritis, unspecified, with bleeding                    R10.84, Generalized abdominal pain                    R63.4, Abnormal weight loss CPT copyright 2014 American Medical Association. All rights reserved. The codes documented in this report are preliminary and upon coder review may  be revised to meet current compliance requirements. Mellody Life, MD 04/16/2015 11:20:44 AM This report has been signed electronically. Number of Addenda: 0 Note Initiated On: 04/16/2015 11:01 AM      [facility]

## 2015-04-16 NOTE — H&P (Signed)
Primary Care Physician:  [name], MD  Pre-Procedure History & Physical: HPI:  Koleman Mckinnies is a 39 y.o. male is here for an endoscopy.   Past Medical History  Diagnosis Date  . Seasonal allergies   . Anxiety   . Depression   . HLD (hyperlipidemia)   . Prostate cancer ([place])   . Schizoaffective disorder ([place])   . ADD (attention deficit disorder)   . Asthma     Past Surgical History  Procedure Laterality Date  . No past surgeries      Prior to Admission medications   Medication Sig Start Date End Date Taking? Authorizing Provider  atorvastatin (LIPITOR) 20 MG tablet Take 1 tablet (20 mg total) by mouth daily at 6 PM. 02/24/15  Yes Jolanta B Pucilowska, MD  divalproex (DEPAKOTE) 500 MG DR tablet Take 2 tablets (1,000 mg total) by mouth at bedtime. 02/24/15  Yes Jolanta B Pucilowska, MD  docusate sodium (COLACE) 100 MG capsule Take 1 capsule (100 mg total) by mouth 2 (two) times daily. 02/24/15  Yes Jolanta B Pucilowska, MD  guanFACINE (TENEX) 2 MG tablet Take 2 mg by mouth 2 (two) times daily.   Yes Historical Provider, MD  lithium 600 MG capsule Take 1 capsule (600 mg total) by mouth at bedtime. 02/24/15  Yes Jolanta B Pucilowska, MD  LORazepam (ATIVAN) 2 MG tablet Take 2 mg by mouth 2 (two) times daily.   Yes Historical Provider, MD  metoprolol succinate (TOPROL-XL) 25 MG 24 hr tablet Take 1 tablet (25 mg total) by mouth daily. 02/24/15  Yes Jolanta B Pucilowska, MD  montelukast (SINGULAIR) 10 MG tablet Take 1 tablet (10 mg total) by mouth at bedtime. 02/24/15  Yes Jolanta B Pucilowska, MD  polyethylene glycol (MIRALAX / GLYCOLAX) packet Take 17 g by mouth daily. 02/24/15  Yes Jolanta B Pucilowska, MD  risperiDONE microspheres (RISPERDAL CONSTA) 50 MG injection Inject 2 mLs (50 mg total) into the muscle every 14 (fourteen) days. 02/24/15  Yes Jolanta B Pucilowska, MD  temazepam (RESTORIL) 30 MG capsule Take 1 capsule (30 mg total) by mouth at bedtime. 02/24/15  Yes  Clovis Fredrickson, MD    Allergies as of 03/24/2015 - Review Complete 03/13/2015  Allergen Reaction Noted  . Sulfa antibiotics Hives 02/03/2015    History reviewed. No pertinent family history.  Social History   Social History  . Marital Status: Single    Spouse Name: N/A  . Number of Children: N/A  . Years of Education: N/A   Occupational History  . Not on file.   Social History Main Topics  . Smoking status: Never Smoker   . Smokeless tobacco: Never Used  . Alcohol Use: No  . Drug Use: No  . Sexual Activity: Not Currently   Other Topics Concern  . Not on file   Social History Narrative     Physical Exam: BP 162/102 mmHg  Pulse 70  Temp(Src) 96 F (35.6 C) (Tympanic)  Resp 16  Ht 5\' 6"  (1.676 m)  Wt 61.689 kg (136 lb)  BMI 21.96 kg/m2  SpO2 100% General:   Alert,  pleasant and cooperative in NAD Head:  Normocephalic and atraumatic. Neck:  Supple; no masses or thyromegaly. Lungs:  Clear throughout to auscultation.    Heart:  Regular rate and rhythm. Abdomen:  Soft, nontender and nondistended. Normal bowel sounds, without guarding, and without rebound.   Neurologic:  Alert and  oriented x2;  grossly normal neurologically.  Impression/Plan: Bruno Kienholz is  here for an endoscopy to be performed for wt loss, abd pain  Risks, benefits, limitations, and alternatives regarding  endoscopy have been reviewed with the patient.  Questions have been answered.  All parties agreeable.   Josefine Class, MD  04/16/2015, 11:00 AM

## 2015-04-18 ENCOUNTER — Encounter: Payer: Self-pay | Admitting: Gastroenterology

## 2015-04-20 LAB — SURGICAL PATHOLOGY

## 2015-05-26 DIAGNOSIS — F250 Schizoaffective disorder, bipolar type: Secondary | ICD-10-CM | POA: Diagnosis not present

## 2015-05-27 DIAGNOSIS — J00 Acute nasopharyngitis [common cold]: Secondary | ICD-10-CM | POA: Diagnosis not present

## 2015-09-14 DIAGNOSIS — F250 Schizoaffective disorder, bipolar type: Secondary | ICD-10-CM | POA: Diagnosis not present

## 2015-09-14 DIAGNOSIS — Z79899 Other long term (current) drug therapy: Secondary | ICD-10-CM | POA: Diagnosis not present

## 2015-09-24 ENCOUNTER — Emergency Department (HOSPITAL_COMMUNITY): Payer: Medicare Other

## 2015-09-24 ENCOUNTER — Encounter (HOSPITAL_COMMUNITY): Payer: Self-pay

## 2015-09-24 ENCOUNTER — Inpatient Hospital Stay (HOSPITAL_COMMUNITY): Payer: Medicare Other

## 2015-09-24 ENCOUNTER — Inpatient Hospital Stay (HOSPITAL_COMMUNITY)
Admission: EM | Admit: 2015-09-24 | Discharge: 2015-09-25 | DRG: 390 | Disposition: A | Discharge status: Home / Self Care | Payer: Medicare Other | Attending: General Surgery | Admitting: General Surgery

## 2015-09-24 DIAGNOSIS — K5900 Constipation, unspecified: Secondary | ICD-10-CM | POA: Diagnosis present

## 2015-09-24 DIAGNOSIS — Z8546 Personal history of malignant neoplasm of prostate: Secondary | ICD-10-CM

## 2015-09-24 DIAGNOSIS — K5660 Unspecified intestinal obstruction: Secondary | ICD-10-CM | POA: Diagnosis not present

## 2015-09-24 DIAGNOSIS — Z4682 Encounter for fitting and adjustment of non-vascular catheter: Secondary | ICD-10-CM | POA: Diagnosis not present

## 2015-09-24 DIAGNOSIS — K56609 Unspecified intestinal obstruction, unspecified as to partial versus complete obstruction: Secondary | ICD-10-CM | POA: Diagnosis present

## 2015-09-24 DIAGNOSIS — F259 Schizoaffective disorder, unspecified: Secondary | ICD-10-CM | POA: Diagnosis present

## 2015-09-24 DIAGNOSIS — E785 Hyperlipidemia, unspecified: Secondary | ICD-10-CM | POA: Diagnosis present

## 2015-09-24 DIAGNOSIS — F419 Anxiety disorder, unspecified: Secondary | ICD-10-CM | POA: Diagnosis present

## 2015-09-24 DIAGNOSIS — R1084 Generalized abdominal pain: Secondary | ICD-10-CM | POA: Diagnosis not present

## 2015-09-24 DIAGNOSIS — R109 Unspecified abdominal pain: Secondary | ICD-10-CM

## 2015-09-24 DIAGNOSIS — F329 Major depressive disorder, single episode, unspecified: Secondary | ICD-10-CM | POA: Diagnosis present

## 2015-09-24 DIAGNOSIS — K5669 Other intestinal obstruction: Secondary | ICD-10-CM | POA: Diagnosis not present

## 2015-09-24 DIAGNOSIS — R1110 Vomiting, unspecified: Secondary | ICD-10-CM | POA: Diagnosis not present

## 2015-09-24 DIAGNOSIS — Z0189 Encounter for other specified special examinations: Secondary | ICD-10-CM

## 2015-09-24 DIAGNOSIS — Z79899 Other long term (current) drug therapy: Secondary | ICD-10-CM | POA: Diagnosis not present

## 2015-09-24 DIAGNOSIS — J45909 Unspecified asthma, uncomplicated: Secondary | ICD-10-CM | POA: Diagnosis present

## 2015-09-24 LAB — URINALYSIS, ROUTINE W REFLEX MICROSCOPIC
BILIRUBIN URINE: NEGATIVE
Glucose, UA: NEGATIVE mg/dL
HGB URINE DIPSTICK: NEGATIVE
KETONES UR: NEGATIVE mg/dL
Leukocytes, UA: NEGATIVE
Nitrite: NEGATIVE
Protein, ur: 30 mg/dL — AB
Specific Gravity, Urine: 1.009 (ref 1.005–1.030)
pH: 6.5 (ref 5.0–8.0)

## 2015-09-24 LAB — CBC
HCT: 41.4 % (ref 39.0–52.0)
Hemoglobin: 12.9 g/dL — ABNORMAL LOW (ref 13.0–17.0)
MCH: 29.6 pg (ref 26.0–34.0)
MCHC: 31.2 g/dL (ref 30.0–36.0)
MCV: 95 fL (ref 78.0–100.0)
PLATELETS: 114 10*3/uL — AB (ref 150–400)
RBC: 4.36 MIL/uL (ref 4.22–5.81)
RDW: 14.3 % (ref 11.5–15.5)
WBC: 9.1 10*3/uL (ref 4.0–10.5)

## 2015-09-24 LAB — COMPREHENSIVE METABOLIC PANEL
ALK PHOS: 20 U/L — AB (ref 38–126)
ALT: 8 U/L — AB (ref 17–63)
AST: 16 U/L (ref 15–41)
Albumin: 4.1 g/dL (ref 3.5–5.0)
Anion gap: 8 (ref 5–15)
BUN: 12 mg/dL (ref 6–20)
CALCIUM: 9.6 mg/dL (ref 8.9–10.3)
CO2: 27 mmol/L (ref 22–32)
CREATININE: 1.2 mg/dL (ref 0.61–1.24)
Chloride: 105 mmol/L (ref 101–111)
Glucose, Bld: 89 mg/dL (ref 65–99)
Potassium: 4.3 mmol/L (ref 3.5–5.1)
SODIUM: 140 mmol/L (ref 135–145)
Total Bilirubin: 0.9 mg/dL (ref 0.3–1.2)
Total Protein: 7.3 g/dL (ref 6.5–8.1)

## 2015-09-24 LAB — URINE MICROSCOPIC-ADD ON

## 2015-09-24 LAB — LIPASE, BLOOD: Lipase: 25 U/L (ref 11–51)

## 2015-09-24 MED ORDER — SODIUM CHLORIDE 0.9 % IV SOLN
Freq: Once | INTRAVENOUS | Status: AC
  Administered 2015-09-24: 20:00:00 via INTRAVENOUS

## 2015-09-24 MED ORDER — DIATRIZOATE MEGLUMINE & SODIUM 66-10 % PO SOLN
90.0000 mL | Freq: Once | ORAL | Status: AC
  Administered 2015-09-25: 90 mL via NASOGASTRIC
  Filled 2015-09-24: qty 90

## 2015-09-24 MED ORDER — SODIUM CHLORIDE 0.9 % IV SOLN
INTRAVENOUS | Status: DC
  Administered 2015-09-24 – 2015-09-25 (×2): 100 mL/h via INTRAVENOUS

## 2015-09-24 MED ORDER — POLYETHYLENE GLYCOL 3350 17 G PO PACK: 17.0000 g | PACK | Freq: Every day | ORAL | Status: DC

## 2015-09-24 MED ORDER — METOPROLOL SUCCINATE ER 25 MG PO TB24: 25.0000 mg | ORAL_TABLET | Freq: Every day | ORAL | Status: DC

## 2015-09-24 MED ORDER — DIVALPROEX SODIUM 500 MG PO DR TAB: 1000.0000 mg | DELAYED_RELEASE_TABLET | Freq: Every day | ORAL | Status: DC

## 2015-09-24 MED ORDER — ENOXAPARIN SODIUM 40 MG/0.4ML ~~LOC~~ SOLN
40.0000 mg | Freq: Every day | SUBCUTANEOUS | Status: DC
  Administered 2015-09-24: 40 mg via SUBCUTANEOUS
  Filled 2015-09-24 (×2): qty 0.4

## 2015-09-24 MED ORDER — ATORVASTATIN CALCIUM 20 MG PO TABS: 20.0000 mg | ORAL_TABLET | Freq: Every day | ORAL | Status: DC

## 2015-09-24 MED ORDER — ACETAMINOPHEN 325 MG PO TABS: 650.0000 mg | ORAL_TABLET | Freq: Four times a day (QID) | ORAL | Status: DC | PRN

## 2015-09-24 MED ORDER — ALUM & MAG HYDROXIDE-SIMETH 200-200-20 MG/5ML PO SUSP: 30.0000 mL | ORAL | Status: DC | PRN

## 2015-09-24 MED ORDER — ONDANSETRON HCL 4 MG/2ML IJ SOLN
4.0000 mg | Freq: Four times a day (QID) | INTRAMUSCULAR | Status: DC | PRN
Start: 2015-09-24 — End: 2015-09-25

## 2015-09-24 MED ORDER — PANTOPRAZOLE SODIUM 40 MG PO TBEC: 80.0000 mg | DELAYED_RELEASE_TABLET | Freq: Every day | ORAL | Status: DC

## 2015-09-24 MED ORDER — TEMAZEPAM 15 MG PO CAPS: 30.0000 mg | ORAL_CAPSULE | Freq: Every day | ORAL | Status: DC

## 2015-09-24 MED ORDER — ONDANSETRON 4 MG PO TBDP: 4.0000 mg | ORAL_TABLET | Freq: Four times a day (QID) | ORAL | Status: DC | PRN

## 2015-09-24 MED ORDER — DIVALPROEX SODIUM 500 MG PO DR TAB
1000.0000 mg | DELAYED_RELEASE_TABLET | Freq: Every day | ORAL | Status: DC
  Administered 2015-09-24: 1000 mg via ORAL
  Filled 2015-09-24 (×2): qty 2

## 2015-09-24 MED ORDER — LORAZEPAM 1 MG PO TABS: 2.0000 mg | ORAL_TABLET | Freq: Two times a day (BID) | ORAL | Status: DC

## 2015-09-24 MED ORDER — METOPROLOL TARTRATE 5 MG/5ML IV SOLN
5.0000 mg | Freq: Three times a day (TID) | INTRAVENOUS | Status: DC
  Administered 2015-09-24 – 2015-09-25 (×2): 5 mg via INTRAVENOUS
  Filled 2015-09-24 (×4): qty 5

## 2015-09-24 MED ORDER — IOPAMIDOL (ISOVUE-300) INJECTION 61%
100.0000 mL | Freq: Once | INTRAVENOUS | Status: AC | PRN
  Administered 2015-09-24: 100 mL via INTRAVENOUS

## 2015-09-24 MED ORDER — LITHIUM CARBONATE 300 MG PO TABS: 600.0000 mg | ORAL_TABLET | Freq: Every day | ORAL | Status: DC

## 2015-09-24 MED ORDER — DOCUSATE SODIUM 100 MG PO CAPS: 100.0000 mg | ORAL_CAPSULE | Freq: Two times a day (BID) | ORAL | Status: DC

## 2015-09-24 MED ORDER — ONDANSETRON HCL 4 MG PO TABS: 4.0000 mg | ORAL_TABLET | Freq: Three times a day (TID) | ORAL | Status: DC | PRN

## 2015-09-24 MED ORDER — MONTELUKAST SODIUM 10 MG PO TABS: 10.0000 mg | ORAL_TABLET | Freq: Every day | ORAL | Status: DC

## 2015-09-24 MED ORDER — LITHIUM CARBONATE 300 MG PO CAPS
600.0000 mg | ORAL_CAPSULE | Freq: Every day | ORAL | Status: DC
  Administered 2015-09-24: 600 mg via ORAL
  Filled 2015-09-24 (×2): qty 2

## 2015-09-24 MED ORDER — ONDANSETRON 4 MG PO TBDP
4.0000 mg | ORAL_TABLET | Freq: Once | ORAL | Status: AC | PRN
  Administered 2015-09-24: 4 mg via ORAL
  Filled 2015-09-24: qty 1

## 2015-09-24 MED ORDER — LORAZEPAM 2 MG/ML IJ SOLN
0.5000 mg | Freq: Four times a day (QID) | INTRAMUSCULAR | Status: DC | PRN
  Administered 2015-09-25: 0.5 mg via INTRAVENOUS
  Filled 2015-09-24: qty 1

## 2015-09-24 MED ORDER — ACETAMINOPHEN 325 MG PO TABS: 650.0000 mg | ORAL_TABLET | ORAL | Status: DC | PRN

## 2015-09-24 MED ORDER — RISPERIDONE MICROSPHERES 50 MG IM SUSR: 50.0000 mg | INTRAMUSCULAR | Status: DC

## 2015-09-24 MED ORDER — PANTOPRAZOLE SODIUM 40 MG IV SOLR
40.0000 mg | Freq: Every day | INTRAVENOUS | Status: DC
Start: 2015-09-24 — End: 2015-09-25
  Administered 2015-09-24: 40 mg via INTRAVENOUS
  Filled 2015-09-24 (×2): qty 40

## 2015-09-24 MED ORDER — MONTELUKAST SODIUM 10 MG PO TABS
10.0000 mg | ORAL_TABLET | Freq: Every day | ORAL | Status: DC
  Filled 2015-09-24 (×3): qty 1

## 2015-09-24 MED ORDER — HYDROMORPHONE HCL 1 MG/ML IJ SOLN: 1.0000 mg | INTRAMUSCULAR | Status: DC | PRN

## 2015-09-24 MED ORDER — SODIUM CHLORIDE 0.9 % IV BOLUS (SEPSIS)
1000.0000 mL | Freq: Once | INTRAVENOUS | Status: AC
  Administered 2015-09-24: 1000 mL via INTRAVENOUS

## 2015-09-24 MED ORDER — ACETAMINOPHEN 650 MG RE SUPP
650.0000 mg | Freq: Four times a day (QID) | RECTAL | Status: DC | PRN
  Administered 2015-09-25: 650 mg via RECTAL
  Filled 2015-09-24: qty 1

## 2015-09-24 NOTE — ED Notes (Signed)
Call [facility] when pt has room upstairs or if need anything [phone]  She is taking his belongings but leaving his glasses.

## 2015-09-24 NOTE — ED Notes (Signed)
Patient pulled his NG tube out. Patient was standing in the middle of the room yelling out that he needed to use the bathroom. Patient taken to the bathroom.

## 2015-09-24 NOTE — ED Provider Notes (Signed)
CSN: [id number]     Arrival date & time 09/24/15  0957 History   First MD Initiated Contact with Patient 09/24/15 1324     Chief Complaint  Patient presents with  . Abdominal Pain  . Emesis     (Consider location/radiation/quality/duration/timing/severity/associated sxs/prior Treatment) HPI Shane Gentry is a 40 y.o. male with history of MR, schizoaffective disorder, depression, anxiety, presents to emergency department complaining of abdominal pain. Patient is a poor historian. Group home director states that abdominal pain started yesterday. Patient has had multiple episodes of emesis yesterday, none today. His last bowel movement was yesterday and was formed. Group home director seems to think that patient may be constipated. He has history of constipation. He has not had any fever or chills. He has not had any other symptoms. He is unable to say what is making symptoms better or worse.   Past Medical History  Diagnosis Date  . Seasonal allergies   . Anxiety   . Depression   . HLD (hyperlipidemia)   . Prostate cancer ([place])   . Schizoaffective disorder ([place])   . ADD (attention deficit disorder)   . Asthma    Past Surgical History  Procedure Laterality Date  . No past surgeries    . Esophagogastroduodenoscopy (egd) with propofol N/A 04/16/2015    Procedure: ESOPHAGOGASTRODUODENOSCOPY (EGD) WITH PROPOFOL;  Surgeon: Josefine Class, MD;  Location: [facility] ENDOSCOPY;  Service: Endoscopy;  Laterality: N/A;   History reviewed. No pertinent family history. Social History  Substance Use Topics  . Smoking status: Never Smoker   . Smokeless tobacco: Never Used  . Alcohol Use: No    Review of Systems  Constitutional: Negative for fever and chills.  Respiratory: Negative for cough, chest tightness and shortness of breath.   Cardiovascular: Negative for chest pain, palpitations and leg swelling.  Gastrointestinal: Positive for nausea, vomiting and abdominal pain. Negative for  diarrhea and abdominal distention.  Genitourinary: Negative for dysuria and frequency.  Musculoskeletal: Negative for neck pain and neck stiffness.  Skin: Negative for rash.  Allergic/Immunologic: Negative for immunocompromised state.  Neurological: Negative for dizziness, weakness, light-headedness, numbness and headaches.      Allergies  Sulfa antibiotics  Home Medications   Prior to Admission medications   Medication Sig Start Date End Date Taking? Authorizing Provider  atorvastatin (LIPITOR) 20 MG tablet Take 1 tablet (20 mg total) by mouth daily at 6 PM. 02/24/15  Yes Jolanta B Pucilowska, MD  divalproex (DEPAKOTE) 500 MG DR tablet Take 2 tablets (1,000 mg total) by mouth at bedtime. 02/24/15  Yes Jolanta B Pucilowska, MD  docusate sodium (COLACE) 100 MG capsule Take 1 capsule (100 mg total) by mouth 2 (two) times daily. 02/24/15  Yes Jolanta B Pucilowska, MD  lithium 300 MG tablet Take 600 mg by mouth at bedtime.   Yes Historical Provider, MD  LORazepam (ATIVAN) 2 MG tablet Take 2 mg by mouth 2 (two) times daily.   Yes Historical Provider, MD  metoprolol succinate (TOPROL-XL) 25 MG 24 hr tablet Take 1 tablet (25 mg total) by mouth daily. 02/24/15  Yes Jolanta B Pucilowska, MD  montelukast (SINGULAIR) 10 MG tablet Take 1 tablet (10 mg total) by mouth at bedtime. 02/24/15  Yes Jolanta B Pucilowska, MD  omeprazole (PRILOSEC) 40 MG capsule Take 40 mg by mouth daily.   Yes Historical Provider, MD  polyethylene glycol (MIRALAX / GLYCOLAX) packet Take 17 g by mouth daily. 02/24/15  Yes Clovis Fredrickson, MD  risperiDONE microspheres (RISPERDAL CONSTA)  50 MG injection Inject 2 mLs (50 mg total) into the muscle every 14 (fourteen) days. 02/24/15  Yes Jolanta B Pucilowska, MD  temazepam (RESTORIL) 30 MG capsule Take 1 capsule (30 mg total) by mouth at bedtime. 02/24/15  Yes Jolanta B Pucilowska, MD  lithium 600 MG capsule Take 1 capsule (600 mg total) by mouth at bedtime. Patient not  taking: Reported on 09/24/2015 02/24/15   Clovis Fredrickson, MD   BP 122/74 mmHg  Pulse 116  Temp(Src) 97.6 F (36.4 C) (Oral)  Resp 15  Ht 5\' 5"  (1.651 m)  Wt 63.504 kg  BMI 23.30 kg/m2  SpO2 100% Physical Exam  Constitutional: He appears well-developed and well-nourished. No distress.  HENT:  Head: Normocephalic and atraumatic.  Eyes: Conjunctivae are normal.  Neck: Neck supple.  Cardiovascular: Normal rate, regular rhythm and normal heart sounds.   Pulmonary/Chest: Effort normal. No respiratory distress. He has no wheezes. He has no rales.  Abdominal: Soft. He exhibits distension. There is tenderness. There is no rebound.  Hypoactive BS, diffuse tenderness, no guarding  Musculoskeletal: He exhibits no edema.  Neurological: He is alert.  Skin: Skin is warm and dry.  Nursing note and vitals reviewed.   ED Course  Procedures (including critical care time) Labs Review Labs Reviewed  COMPREHENSIVE METABOLIC PANEL - Abnormal; Notable for the following:    ALT 8 (*)    Alkaline Phosphatase 20 (*)    All other components within normal limits  CBC - Abnormal; Notable for the following:    Hemoglobin 12.9 (*)    Platelets 114 (*)    All other components within normal limits  LIPASE, BLOOD  URINALYSIS, ROUTINE W REFLEX MICROSCOPIC (NOT AT [facility])    Imaging Review Ct Abdomen Pelvis W Contrast  09/24/2015  CLINICAL DATA:  Generalized abdominal pain and emesis for 1 day. Last bowel movement was yesterday. Plain film exam from earlier today described possible bowel obstruction. EXAM: CT ABDOMEN AND PELVIS WITH CONTRAST TECHNIQUE: Multidetector CT imaging of the abdomen and pelvis was performed using the standard protocol following bolus administration of intravenous contrast. CONTRAST:  175mL ISOVUE-300 IOPAMIDOL (ISOVUE-300) INJECTION 61% COMPARISON:  None. FINDINGS: Lower chest: Trace pleural effusions at each lung base. Mild atelectasis at the left lung base. Trace pericardial  effusion. Hepatobiliary: No masses or other significant abnormality. Pancreas: No mass, inflammatory changes, or other significant abnormality. Spleen: Within normal limits in size and appearance. Adrenals/Urinary Tract: No masses identified. No evidence of hydronephrosis. Stomach/Bowel: Moderately dilated small bowel is seen throughout the abdomen, with transition zone to relatively decompressed small bowel in the left lower quadrant. There is circumferential thickening of the walls of the sigmoid colon in the left abdomen, possibly reactive in nature or contributing to the more proximal obstruction. Large bowel is relatively normal in caliber throughout. Moderate amount of stool and gas within the colon. Appendix is normal. Stomach is normal. Vascular/Lymphatic: No pathologically enlarged lymph nodes. No evidence of abdominal aortic aneurysm. Reproductive: No mass or other significant abnormality. Other: Small amount of free fluid in the pelvis. No abscess collection identified. No free intraperitoneal air. No evidence of pneumatosis intestinalis seen. Musculoskeletal:  No acute or suspicious osseous lesion. IMPRESSION: 1. Small-bowel obstruction with transition zone in the left lower quadrant. At this transition zone site, there is no mass or other obvious cause for the obstruction seen. There is thickening of the walls of the small bowel within a slightly more proximal position in the left abdomen which may be  reactive in nature or contributory (reactive, infectious or inflammatory wall thickening). There is stool and air within the colon suggesting a partial small bowel obstruction. 2. Associated small amount of free fluid in the pelvis. No abscess collection seen. No free intraperitoneal air seen. 3. Trace pleural effusions at each lung base and mild atelectasis at the left lung base. 4. Trace pericardial effusion at the heart base. Electronically Signed   By: Franki Cabot M.D.   On: 09/24/2015 15:48   Dg  Abd 2 Views  09/24/2015  CLINICAL DATA:  Diffuse abdominal pain with vomiting EXAM: ABDOMEN - 2 VIEW COMPARISON:  CT abdomen and pelvis February 18, 2015 FINDINGS: Supine and left lateral decubitus abdomen images were obtained. There is dilatation of multiple loops of small bowel with multiple air-fluid levels. There is only modest air in the colon and no air in the rectum. No free air evident. No abnormal calcifications. IMPRESSION: Bowel gas pattern concerning for obstruction.  No free air evident. Electronically Signed   By: Lowella Grip III M.D.   On: 09/24/2015 14:12   I have personally reviewed and evaluated these images and lab results as part of my medical decision-making.   EKG Interpretation None      MDM   Final diagnoses:  Abdominal pain    Patient emergency department with diffuse abdominal pain, multiple episodes of emesis yesterday, none today. Abdomen is soft and exam, diffusely tender, appears to be distended. Bowel sounds are present but hypoactive. Labs obtained, unremarkable. Exam concerning for possible small bowel obstruction versus constipation. We'll get plain films of the abdomen.  X-ray showing possible small bowel projection, will order CT scan. Patient does appear to be in a lot of distress.  4:02 PM CT showing partial small bowel obstruction. Will place NG tube and call general surgery.  Discussed with surgery, will admit.   Filed Vitals:   09/24/15 1031 09/24/15 1457  BP: 122/74 90/67  Pulse: 116 70  Temp: 97.6 F (36.4 C)   TempSrc: Oral   Resp: 15 19  Height: 5\' 5"  (1.651 m)   Weight: 63.504 kg   SpO2: 100% 100%       Jeannett Senior, PA-C 09/24/15 [place], MD 09/25/15 [phone]

## 2015-09-24 NOTE — Progress Notes (Signed)
Utilization Review completed.  Kyrollos Cordell RN CM  

## 2015-09-24 NOTE — ED Notes (Signed)
Shane Gentry - [phone] legal Guardian - for signing paperwork  [email or web address]

## 2015-09-24 NOTE — ED Notes (Signed)
Pt c/o generalized abdominal pain and emesis x 1 days.  Denies diarrhea.  Pt's group home director reports that she thinks the pain is from constipation.  Last BM was yesterday.

## 2015-09-24 NOTE — H&P (Signed)
Shane Gentry is an 40 y.o. male.   Chief Complaint: abd pain, vomiting HPI: 20 yom who is resident of group home and his director is here with him. He has history of constipation and is on softeners and miralax.  He does not appear to have any real surgical history.  He began having some abdominal pain with vomiting overnight and into today.  Last bm was yesterday. I cant get much more history from him.  He has ng tube now and is only saying that he is hungry.   Past Medical History  Diagnosis Date  . Seasonal allergies   . Anxiety   . Depression   . HLD (hyperlipidemia)   . Prostate cancer ([place])   . Schizoaffective disorder ([place])   . ADD (attention deficit disorder)   . Asthma     Past Surgical History  Procedure Laterality Date  . No past surgeries    . Esophagogastroduodenoscopy (egd) with propofol N/A 04/16/2015    Procedure: ESOPHAGOGASTRODUODENOSCOPY (EGD) WITH PROPOFOL;  Surgeon: Josefine Class, MD;  Location: [facility] ENDOSCOPY;  Service: Endoscopy;  Laterality: N/A;    History reviewed. No pertinent family history. Social History:  reports that he has never smoked. He has never used smokeless tobacco. He reports that he does not drink alcohol or use illicit drugs.  Allergies:  Allergies  Allergen Reactions  . Sulfa Antibiotics Hives    meds reviewed.  Results for orders placed or performed during the hospital encounter of 09/24/15 (from the past 48 hour(s))  Lipase, blood     Status: None   Collection Time: 09/24/15 11:25 AM  Result Value Ref Range   Lipase 25 11 - 51 U/L  Comprehensive metabolic panel     Status: Abnormal   Collection Time: 09/24/15 11:25 AM  Result Value Ref Range   Sodium 140 135 - 145 mmol/L   Potassium 4.3 3.5 - 5.1 mmol/L   Chloride 105 101 - 111 mmol/L   CO2 27 22 - 32 mmol/L   Glucose, Bld 89 65 - 99 mg/dL   BUN 12 6 - 20 mg/dL   Creatinine, Ser 1.20 0.61 - 1.24 mg/dL   Calcium 9.6 8.9 - 10.3 mg/dL   Total Protein 7.3 6.5 - 8.1  g/dL   Albumin 4.1 3.5 - 5.0 g/dL   AST 16 15 - 41 U/L   ALT 8 (L) 17 - 63 U/L   Alkaline Phosphatase 20 (L) 38 - 126 U/L   Total Bilirubin 0.9 0.3 - 1.2 mg/dL   GFR calc non Af Amer >60 >60 mL/min   GFR calc Af Amer >60 >60 mL/min    Comment: (NOTE) The eGFR has been calculated using the CKD EPI equation. This calculation has not been validated in all clinical situations. eGFR's persistently <60 mL/min signify possible Chronic Kidney Disease.    Anion gap 8 5 - 15  CBC     Status: Abnormal   Collection Time: 09/24/15 11:25 AM  Result Value Ref Range   WBC 9.1 4.0 - 10.5 K/uL   RBC 4.36 4.22 - 5.81 MIL/uL   Hemoglobin 12.9 (L) 13.0 - 17.0 g/dL   HCT 41.4 39.0 - 52.0 %   MCV 95.0 78.0 - 100.0 fL   MCH 29.6 26.0 - 34.0 pg   MCHC 31.2 30.0 - 36.0 g/dL   RDW 14.3 11.5 - 15.5 %   Platelets 114 (L) 150 - 400 K/uL    Comment: REPEATED TO VERIFY SPECIMEN CHECKED FOR CLOTS PLATELET  COUNT CONFIRMED BY SMEAR   Urinalysis, Routine w reflex microscopic     Status: Abnormal   Collection Time: 09/24/15  2:57 PM  Result Value Ref Range   Color, Urine YELLOW YELLOW   APPearance CLEAR CLEAR   Specific Gravity, Urine 1.009 1.005 - 1.030   pH 6.5 5.0 - 8.0   Glucose, UA NEGATIVE NEGATIVE mg/dL   Hgb urine dipstick NEGATIVE NEGATIVE   Bilirubin Urine NEGATIVE NEGATIVE   Ketones, ur NEGATIVE NEGATIVE mg/dL   Protein, ur 30 (A) NEGATIVE mg/dL   Nitrite NEGATIVE NEGATIVE   Leukocytes, UA NEGATIVE NEGATIVE  Urine microscopic-add on     Status: Abnormal   Collection Time: 09/24/15  2:57 PM  Result Value Ref Range   Squamous Epithelial / LPF 0-5 (A) NONE SEEN   WBC, UA 0-5 0 - 5 WBC/hpf   RBC / HPF 0-5 0 - 5 RBC/hpf   Bacteria, UA RARE (A) NONE SEEN   Ct Abdomen Pelvis W Contrast  09/24/2015  CLINICAL DATA:  Generalized abdominal pain and emesis for 1 day. Last bowel movement was yesterday. Plain film exam from earlier today described possible bowel obstruction. EXAM: CT ABDOMEN AND PELVIS  WITH CONTRAST TECHNIQUE: Multidetector CT imaging of the abdomen and pelvis was performed using the standard protocol following bolus administration of intravenous contrast. CONTRAST:  120m ISOVUE-300 IOPAMIDOL (ISOVUE-300) INJECTION 61% COMPARISON:  None. FINDINGS: Lower chest: Trace pleural effusions at each lung base. Mild atelectasis at the left lung base. Trace pericardial effusion. Hepatobiliary: No masses or other significant abnormality. Pancreas: No mass, inflammatory changes, or other significant abnormality. Spleen: Within normal limits in size and appearance. Adrenals/Urinary Tract: No masses identified. No evidence of hydronephrosis. Stomach/Bowel: Moderately dilated small bowel is seen throughout the abdomen, with transition zone to relatively decompressed small bowel in the left lower quadrant. There is circumferential thickening of the walls of the sigmoid colon in the left abdomen, possibly reactive in nature or contributing to the more proximal obstruction. Large bowel is relatively normal in caliber throughout. Moderate amount of stool and gas within the colon. Appendix is normal. Stomach is normal. Vascular/Lymphatic: No pathologically enlarged lymph nodes. No evidence of abdominal aortic aneurysm. Reproductive: No mass or other significant abnormality. Other: Small amount of free fluid in the pelvis. No abscess collection identified. No free intraperitoneal air. No evidence of pneumatosis intestinalis seen. Musculoskeletal:  No acute or suspicious osseous lesion. IMPRESSION: 1. Small-bowel obstruction with transition zone in the left lower quadrant. At this transition zone site, there is no mass or other obvious cause for the obstruction seen. There is thickening of the walls of the small bowel within a slightly more proximal position in the left abdomen which may be reactive in nature or contributory (reactive, infectious or inflammatory wall thickening). There is stool and air within the  colon suggesting a partial small bowel obstruction. 2. Associated small amount of free fluid in the pelvis. No abscess collection seen. No free intraperitoneal air seen. 3. Trace pleural effusions at each lung base and mild atelectasis at the left lung base. 4. Trace pericardial effusion at the heart base. Electronically Signed   By: SFranki CabotM.D.   On: 09/24/2015 15:48   Dg Abd 2 Views  09/24/2015  CLINICAL DATA:  Diffuse abdominal pain with vomiting EXAM: ABDOMEN - 2 VIEW COMPARISON:  CT abdomen and pelvis February 18, 2015 FINDINGS: Supine and left lateral decubitus abdomen images were obtained. There is dilatation of multiple loops of small bowel with  multiple air-fluid levels. There is only modest air in the colon and no air in the rectum. No free air evident. No abnormal calcifications. IMPRESSION: Bowel gas pattern concerning for obstruction.  No free air evident. Electronically Signed   By: Lowella Grip III M.D.   On: 09/24/2015 14:12    Review of Systems  Unable to perform ROS: dementia    Blood pressure 90/67, pulse 70, temperature 97.6 F (36.4 C), temperature source Oral, resp. rate 19, height _0  (1.651 m), weight 63.504 kg (140 lb), SpO2 100 %. Physical Exam  Vitals reviewed. Constitutional: He appears well-developed and well-nourished.  HENT:  Head: Normocephalic and atraumatic.  Eyes: No scleral icterus.  Cardiovascular: Normal rate, regular rhythm and normal heart sounds.   Respiratory: Effort normal and breath sounds normal. He has no wheezes. He has no rales.  GI: He exhibits no distension. Bowel sounds are decreased. There is no tenderness. A hernia (small reducible umbillical hernia) is present.     Assessment/Plan sbo  Unsure of etiology, do not see any scars on him.  Appears to have sbo but not operative at this point. His exam is very benign. Will admit npo except meds, ng tube and do small bowel obstruction protocol   Rogene Meth, MD 09/24/2015,  5:12 PM

## 2015-09-25 ENCOUNTER — Inpatient Hospital Stay (HOSPITAL_COMMUNITY): Payer: Medicare Other

## 2015-09-25 LAB — CBC
HEMATOCRIT: 38.5 % — AB (ref 39.0–52.0)
Hemoglobin: 12.1 g/dL — ABNORMAL LOW (ref 13.0–17.0)
MCH: 29.1 pg (ref 26.0–34.0)
MCHC: 31.4 g/dL (ref 30.0–36.0)
MCV: 92.5 fL (ref 78.0–100.0)
Platelets: 123 10*3/uL — ABNORMAL LOW (ref 150–400)
RBC: 4.16 MIL/uL — AB (ref 4.22–5.81)
RDW: 14.3 % (ref 11.5–15.5)
WBC: 8 10*3/uL (ref 4.0–10.5)

## 2015-09-25 LAB — BASIC METABOLIC PANEL
Anion gap: 8 (ref 5–15)
BUN: 11 mg/dL (ref 6–20)
CHLORIDE: 111 mmol/L (ref 101–111)
CO2: 27 mmol/L (ref 22–32)
CREATININE: 1.16 mg/dL (ref 0.61–1.24)
Calcium: 9.8 mg/dL (ref 8.9–10.3)
Glucose, Bld: 83 mg/dL (ref 65–99)
POTASSIUM: 4.4 mmol/L (ref 3.5–5.1)
SODIUM: 146 mmol/L — AB (ref 135–145)

## 2015-09-25 MED ORDER — CETYLPYRIDINIUM CHLORIDE 0.05 % MT LIQD: 7.0000 mL | Freq: Two times a day (BID) | OROMUCOSAL | Status: DC

## 2015-09-25 MED ORDER — KCL IN DEXTROSE-NACL 10-5-0.45 MEQ/L-%-% IV SOLN
INTRAVENOUS | Status: DC
  Filled 2015-09-25 (×2): qty 1000

## 2015-09-25 NOTE — Progress Notes (Signed)
Pt's vitals are WNL, tolerating diet and pain is under control. Discussed discharge instructions with Jenny Reichmann the guardian. All questions and concerns were addressed. Discharged to group home.

## 2015-09-25 NOTE — Progress Notes (Signed)
Subjective: No flatus, wants to eat  Objective: Vital signs in last 24 hours: Temp:  [97.6 F (36.4 C)-98.7 F (37.1 C)] 98.2 F (36.8 C) (05/19 0617) Pulse Rate:  [70-116] 92 (05/19 0617) Resp:  [15-19] 18 (05/19 0617) BP: (90-128)/(58-75) 107/58 mmHg (05/19 0617) SpO2:  [100 %] 100 % (05/19 0617) Weight:  [63.504 kg (140 lb)] 63.504 kg (140 lb) (05/18 1031)    Intake/Output from previous day: 05/18 0701 - 05/19 0700 In: 681.7 [I.V.:681.7] Out: 2150 [Urine:1250; Emesis/NG output:900] Intake/Output this shift: Total I/O In: -  Out: 200 [Urine:200]  Resp: clear to auscultation bilaterally Cardio: regular rate and rhythm GI: soft some bs nontender  Lab Results:   Recent Labs  09/24/15 1125 09/25/15 0414  WBC 9.1 8.0  HGB 12.9* 12.1*  HCT 41.4 38.5*  PLT 114* 123*   BMET  Recent Labs  09/24/15 1125 09/25/15 0414  NA 140 146*  K 4.3 4.4  CL 105 111  CO2 27 27  GLUCOSE 89 83  BUN 12 11  CREATININE 1.20 1.16  CALCIUM 9.6 9.8   PT/INR No results for input(s): LABPROT, INR in the last 72 hours. ABG No results for input(s): PHART, HCO3 in the last 72 hours.  Invalid input(s): PCO2, PO2  Studies/Results: Ct Abdomen Pelvis W Contrast  09/24/2015  CLINICAL DATA:  Generalized abdominal pain and emesis for 1 day. Last bowel movement was yesterday. Plain film exam from earlier today described possible bowel obstruction. EXAM: CT ABDOMEN AND PELVIS WITH CONTRAST TECHNIQUE: Multidetector CT imaging of the abdomen and pelvis was performed using the standard protocol following bolus administration of intravenous contrast. CONTRAST:  170mL ISOVUE-300 IOPAMIDOL (ISOVUE-300) INJECTION 61% COMPARISON:  None. FINDINGS: Lower chest: Trace pleural effusions at each lung base. Mild atelectasis at the left lung base. Trace pericardial effusion. Hepatobiliary: No masses or other significant abnormality. Pancreas: No mass, inflammatory changes, or other significant abnormality.  Spleen: Within normal limits in size and appearance. Adrenals/Urinary Tract: No masses identified. No evidence of hydronephrosis. Stomach/Bowel: Moderately dilated small bowel is seen throughout the abdomen, with transition zone to relatively decompressed small bowel in the left lower quadrant. There is circumferential thickening of the walls of the sigmoid colon in the left abdomen, possibly reactive in nature or contributing to the more proximal obstruction. Large bowel is relatively normal in caliber throughout. Moderate amount of stool and gas within the colon. Appendix is normal. Stomach is normal. Vascular/Lymphatic: No pathologically enlarged lymph nodes. No evidence of abdominal aortic aneurysm. Reproductive: No mass or other significant abnormality. Other: Small amount of free fluid in the pelvis. No abscess collection identified. No free intraperitoneal air. No evidence of pneumatosis intestinalis seen. Musculoskeletal:  No acute or suspicious osseous lesion. IMPRESSION: 1. Small-bowel obstruction with transition zone in the left lower quadrant. At this transition zone site, there is no mass or other obvious cause for the obstruction seen. There is thickening of the walls of the small bowel within a slightly more proximal position in the left abdomen which may be reactive in nature or contributory (reactive, infectious or inflammatory wall thickening). There is stool and air within the colon suggesting a partial small bowel obstruction. 2. Associated small amount of free fluid in the pelvis. No abscess collection seen. No free intraperitoneal air seen. 3. Trace pleural effusions at each lung base and mild atelectasis at the left lung base. 4. Trace pericardial effusion at the heart base. Electronically Signed   By: Franki Cabot M.D.   On:  09/24/2015 15:48   Dg Abd 2 Views  09/24/2015  CLINICAL DATA:  Diffuse abdominal pain with vomiting EXAM: ABDOMEN - 2 VIEW COMPARISON:  CT abdomen and pelvis February 18, 2015 FINDINGS: Supine and left lateral decubitus abdomen images were obtained. There is dilatation of multiple loops of small bowel with multiple air-fluid levels. There is only modest air in the colon and no air in the rectum. No free air evident. No abnormal calcifications. IMPRESSION: Bowel gas pattern concerning for obstruction.  No free air evident. Electronically Signed   By: Lowella Grip III M.D.   On: 09/24/2015 14:12   Dg Abd Portable 1v-small Bowel Protocol-position Verification  09/24/2015  CLINICAL DATA:  Nasogastric tube placement EXAM: PORTABLE ABDOMEN - 1 VIEW COMPARISON:  Portable exam 2245 hours compared to CT of 09/24/2015 FINDINGS: Nasogastric tube projects over proximal stomach. Dilated small bowel loops compatible with small bowel obstruction. Small amounts of colonic gas are seen. No definite bowel wall thickening. Lung bases clear. IMPRESSION: Small obstruction. Tip of nasogastric tube projects over proximal stomach. Electronically Signed   By: Lavonia Dana M.D.   On: 09/24/2015 23:16    Anti-infectives: Anti-infectives    None      Assessment/Plan: sbo unknown etiology  His exam is much better, hard to get history Will check 8 hour follow up film and then decide on plan after that For now continue ng drainage and npo Ambulate Lovenox, scds   [facility] 09/25/2015

## 2015-09-25 NOTE — Progress Notes (Signed)
Patient is medically stable for discharge to return back to group home today. Contact made with legal guardian regarding return and she is in agreement. Facility contacted and also in agreement with accepting patient back. Patient here less than 24 hours, thus no FL2 completed/no changes.  All clinicals faxed to facility per request. Facility coming to pick patient up from hospital. RN made aware of plan.  Lane Hacker, MSW Clinical Social Work: System [employer] [phone]

## 2015-09-25 NOTE — Discharge Instructions (Signed)
Small Bowel Obstruction °A small bowel obstruction is a blockage in the small bowel. The small bowel, which is also called the small intestine, is a long, slender tube that connects the stomach to the colon. When a person eats and drinks, food and fluids go from the stomach to the small bowel. This is where most of the nutrients in the food and fluids are absorbed. °A small bowel obstruction will prevent food and fluids from passing through the small bowel as they normally do during digestion. The small bowel can become partially or completely blocked. This can cause symptoms such as abdominal pain, vomiting, and bloating. If this condition is not treated, it can be dangerous because the small bowel could rupture. °CAUSES °Common causes of this condition include: °· Scar tissue from previous surgery or radiation treatment. °· Recent surgery. This may cause the movements of the bowel to slow down and cause food to block the intestine. °· Hernias. °· Inflammatory bowel disease (colitis). °· Twisting of the bowel (volvulus). °· Tumors. °· A foreign body. °· Slipping of a part of the bowel into another part (intussusception). °SYMPTOMS °Symptoms of this condition include: °· Abdominal pain. This may be dull cramps or sharp pain. It may occur in one area, or it may be present in the entire abdomen. Pain can range from mild to severe, depending on the degree of obstruction. °· Nausea and vomiting. Vomit may be greenish or a yellow bile color. °· Abdominal bloating. °· Constipation. °· Lack of passing gas. °· Frequent belching. °· Diarrhea. This may occur if the obstruction is partial and runny stool is able to leak around the obstruction. °DIAGNOSIS °This condition may be diagnosed based on a physical exam, medical history, and X-rays of the abdomen. You may also have other tests, such as a CT scan of the abdomen and pelvis. °TREATMENT °Treatment for this condition depends on the cause and severity of the problem.  Treatment options may include: °· Bed rest along with fluids and pain medicines that are given through an IV tube inserted into one of your veins. Sometimes, this is all that is needed for the obstruction to improve. °· Following a simple diet. In some cases, a clear liquid diet may be required for several days. This allows the bowel to rest. °· Placement of a small tube (nasogastric tube) into the stomach. When the bowel is blocked, it usually swells up like a balloon that is filled with air and fluids. The air and fluids may be removed by suction through the nasogastric tube. This can help with pain, discomfort, and nausea. It can also help the obstruction to clear up faster. °· Surgery. This may be required if other treatments do not work. Bowel obstruction from a hernia may require early surgery and can be an emergency procedure. Surgery may also be required for scar tissue that causes frequent or severe obstructions. °HOME CARE INSTRUCTIONS °· Get plenty of rest. °· Follow instructions from your health care provider about eating restrictions. You may need to avoid solid foods and consume only clear liquids until your condition improves. °· Take over-the-counter and prescription medicines only as told by your health care provider. °· Keep all follow-up visits as told by your health care provider. This is important. °SEEK MEDICAL CARE IF: °· You have a fever. °· You have chills. °SEEK IMMEDIATE MEDICAL CARE IF: °· You have increased pain or cramping. °· You vomit blood. °· You have uncontrolled vomiting or nausea. °· You cannot drink   fluids because of vomiting or pain. °· You develop confusion. °· You begin feeling very dry or thirsty (dehydrated). °· You have severe bloating. °· You feel extremely weak or you faint. °  °This information is not intended to replace advice given to you by your health care provider. Make sure you discuss any questions you have with your health care provider. °  °Document Released:  07/12/2005 Document Revised: 01/14/2015 Document Reviewed: 06/19/2014 °Elsevier Interactive Patient Education ©2016 [employer] Inc[employer] ° ° °

## 2015-09-25 NOTE — Discharge Summary (Signed)
Physician Discharge Summary  Shane Gentry [id number] DOB: 04/27/1976 DOA: 09/24/2015  PCP: [name], MD  Consultation: none  Admit date: 09/24/2015 Discharge date: 09/25/2015  Recommendations for Outpatient Follow-up:   Follow-up Information    Follow up with [name], MD.   Specialty:  Internal Medicine   Contact information:   [address] [place] [address] [phone]      Discharge Diagnoses:  1. sbo   Surgical Procedure: none  Discharge Condition: stable Disposition: home  Diet recommendation: regular  Filed Weights   09/24/15 1031  Weight: 63.504 kg (140 lb)    Filed Vitals:   09/24/15 2100 09/25/15 0617  BP: 128/70 107/58  Pulse: 86 92  Temp: 98.7 F (37.1 C) 98.2 F (36.8 C)  Resp: 18 18      Hospital Course:  Shane Gentry presented to [facility] with abdominal pain and vomiting.  He was found to have a sbo.  He was admitted and started on a SBO protocol.  He quickly improved, pulled out his NGT, but the AXR showed contrast in the colon.  He was therefore started on fulls which he tolerated well.  He was then felt stable for discharge home.  Warning signs that warrant further evaluation were discussed.    Discharge Instructions     Medication List    TAKE these medications        atorvastatin 20 MG tablet  Commonly known as:  LIPITOR  Take 1 tablet (20 mg total) by mouth daily at 6 PM.     divalproex 500 MG DR tablet  Commonly known as:  DEPAKOTE  Take 2 tablets (1,000 mg total) by mouth at bedtime.     docusate sodium 100 MG capsule  Commonly known as:  COLACE  Take 1 capsule (100 mg total) by mouth 2 (two) times daily.     lithium 300 MG tablet  Take 600 mg by mouth at bedtime.     lithium 600 MG capsule  Take 1 capsule (600 mg total) by mouth at bedtime.     LORazepam 2 MG tablet  Commonly known as:  ATIVAN  Take 2 mg by mouth 2 (two) times daily.     metoprolol succinate 25 MG 24 hr tablet   Commonly known as:  TOPROL-XL  Take 1 tablet (25 mg total) by mouth daily.     montelukast 10 MG tablet  Commonly known as:  SINGULAIR  Take 1 tablet (10 mg total) by mouth at bedtime.     omeprazole 40 MG capsule  Commonly known as:  PRILOSEC  Take 40 mg by mouth daily.     polyethylene glycol packet  Commonly known as:  MIRALAX / GLYCOLAX  Take 17 g by mouth daily.     risperiDONE microspheres 50 MG injection  Commonly known as:  RISPERDAL CONSTA  Inject 2 mLs (50 mg total) into the muscle every 14 (fourteen) days.     temazepam 30 MG capsule  Commonly known as:  RESTORIL  Take 1 capsule (30 mg total) by mouth at bedtime.           Follow-up Information    Follow up with [name], MD.   Specialty:  Internal Medicine   Contact information:   [address] [place] [employer] [place]  [address] [phone]        The results of significant diagnostics from this hospitalization (including imaging, microbiology, ancillary and laboratory) are listed below for reference.  Significant Diagnostic Studies: Ct Abdomen Pelvis W Contrast  09/24/2015  CLINICAL DATA:  Generalized abdominal pain and emesis for 1 day. Last bowel movement was yesterday. Plain film exam from earlier today described possible bowel obstruction. EXAM: CT ABDOMEN AND PELVIS WITH CONTRAST TECHNIQUE: Multidetector CT imaging of the abdomen and pelvis was performed using the standard protocol following bolus administration of intravenous contrast. CONTRAST:  155mL ISOVUE-300 IOPAMIDOL (ISOVUE-300) INJECTION 61% COMPARISON:  None. FINDINGS: Lower chest: Trace pleural effusions at each lung base. Mild atelectasis at the left lung base. Trace pericardial effusion. Hepatobiliary: No masses or other significant abnormality. Pancreas: No mass, inflammatory changes, or other significant abnormality. Spleen: Within normal limits in size and appearance. Adrenals/Urinary Tract: No masses identified. No evidence of  hydronephrosis. Stomach/Bowel: Moderately dilated small bowel is seen throughout the abdomen, with transition zone to relatively decompressed small bowel in the left lower quadrant. There is circumferential thickening of the walls of the sigmoid colon in the left abdomen, possibly reactive in nature or contributing to the more proximal obstruction. Large bowel is relatively normal in caliber throughout. Moderate amount of stool and gas within the colon. Appendix is normal. Stomach is normal. Vascular/Lymphatic: No pathologically enlarged lymph nodes. No evidence of abdominal aortic aneurysm. Reproductive: No mass or other significant abnormality. Other: Small amount of free fluid in the pelvis. No abscess collection identified. No free intraperitoneal air. No evidence of pneumatosis intestinalis seen. Musculoskeletal:  No acute or suspicious osseous lesion. IMPRESSION: 1. Small-bowel obstruction with transition zone in the left lower quadrant. At this transition zone site, there is no mass or other obvious cause for the obstruction seen. There is thickening of the walls of the small bowel within a slightly more proximal position in the left abdomen which may be reactive in nature or contributory (reactive, infectious or inflammatory wall thickening). There is stool and air within the colon suggesting a partial small bowel obstruction. 2. Associated small amount of free fluid in the pelvis. No abscess collection seen. No free intraperitoneal air seen. 3. Trace pleural effusions at each lung base and mild atelectasis at the left lung base. 4. Trace pericardial effusion at the heart base. Electronically Signed   By: Franki Cabot M.D.   On: 09/24/2015 15:48   Dg Abd 2 Views  09/24/2015  CLINICAL DATA:  Diffuse abdominal pain with vomiting EXAM: ABDOMEN - 2 VIEW COMPARISON:  CT abdomen and pelvis February 18, 2015 FINDINGS: Supine and left lateral decubitus abdomen images were obtained. There is dilatation of multiple  loops of small bowel with multiple air-fluid levels. There is only modest air in the colon and no air in the rectum. No free air evident. No abnormal calcifications. IMPRESSION: Bowel gas pattern concerning for obstruction.  No free air evident. Electronically Signed   By: Lowella Grip III M.D.   On: 09/24/2015 14:12   Dg Abd Portable 1v-small Bowel Obstruction Protocol-initial, 8 Hr Delay  09/25/2015  CLINICAL DATA:  Small bowel obstruction. EXAM: PORTABLE ABDOMEN - 1 VIEW COMPARISON:  09/24/2015 FINDINGS: Enteric tube has been removed. Oral contrast is present in the colon to the level of the sigmoid colon. Gas is present in nondilated small bowel loops in the central abdomen, with the small bowel dilatation on the prior study having resolved in the interim. No gross intraperitoneal free air on this supine study. No acute osseous abnormality. IMPRESSION: No current evidence of small bowel obstruction. Resolved small bowel dilatation with contrast to the sigmoid colon. Electronically Signed  By: Logan Bores M.D.   On: 09/25/2015 10:55   Dg Abd Portable 1v-small Bowel Protocol-position Verification  09/24/2015  CLINICAL DATA:  Nasogastric tube placement EXAM: PORTABLE ABDOMEN - 1 VIEW COMPARISON:  Portable exam 2245 hours compared to CT of 09/24/2015 FINDINGS: Nasogastric tube projects over proximal stomach. Dilated small bowel loops compatible with small bowel obstruction. Small amounts of colonic gas are seen. No definite bowel wall thickening. Lung bases clear. IMPRESSION: Small obstruction. Tip of nasogastric tube projects over proximal stomach. Electronically Signed   By: Lavonia Dana M.D.   On: 09/24/2015 23:16    Microbiology: No results found for this or any previous visit (from the past 240 hour(s)).   Labs: Basic Metabolic Panel:  Recent Labs Lab 09/24/15 1125 09/25/15 0414  NA 140 146*  K 4.3 4.4  CL 105 111  CO2 27 27  GLUCOSE 89 83  BUN 12 11  CREATININE 1.20 1.16   CALCIUM 9.6 9.8   Liver Function Tests:  Recent Labs Lab 09/24/15 1125  AST 16  ALT 8*  ALKPHOS 20*  BILITOT 0.9  PROT 7.3  ALBUMIN 4.1    Recent Labs Lab 09/24/15 1125  LIPASE 25   No results for input(s): AMMONIA in the last 168 hours. CBC:  Recent Labs Lab 09/24/15 1125 09/25/15 0414  WBC 9.1 8.0  HGB 12.9* 12.1*  HCT 41.4 38.5*  MCV 95.0 92.5  PLT 114* 123*   Cardiac Enzymes: No results for input(s): CKTOTAL, CKMB, CKMBINDEX, TROPONINI in the last 168 hours. BNP: BNP (last 3 results) No results for input(s): BNP in the last 8760 hours.  ProBNP (last 3 results) No results for input(s): PROBNP in the last 8760 hours.  CBG: No results for input(s): GLUCAP in the last 168 hours.  Active Problems:   SBO (small bowel obstruction) ([place])   Time coordinating discharge: <30 mins   Signed:  Hayzen Lorenson, ANP-BC

## 2015-09-25 NOTE — Progress Notes (Addendum)
LCSW following as patient is admitted from a group home: [facility] where he has been a resident for many years. Most recent Psychiatric Admission:  2016 Outpatient Mental Health:  Dr. Rosine Door and Michail Sermon Patient has LD, MR, and schizoaffective disorder.   Call placed to patient legal guardian and message left:   Danae Chen Fears - [phone] legal Guardian - for signing paperwork  [email or web address]        Will work to reach group home as well to obtain more information. Full assessment to follow once information gathered.  Lane Hacker, MSW Clinical Social Work: [employer] [phone]

## 2015-09-30 DIAGNOSIS — W0110XA Fall on same level from slipping, tripping and stumbling with subsequent striking against unspecified object, initial encounter: Secondary | ICD-10-CM | POA: Diagnosis not present

## 2015-09-30 DIAGNOSIS — S0081XA Abrasion of other part of head, initial encounter: Secondary | ICD-10-CM | POA: Diagnosis not present

## 2015-09-30 DIAGNOSIS — F71 Moderate intellectual disabilities: Secondary | ICD-10-CM | POA: Diagnosis not present

## 2015-09-30 DIAGNOSIS — S0001XA Abrasion of scalp, initial encounter: Secondary | ICD-10-CM | POA: Diagnosis not present

## 2015-09-30 DIAGNOSIS — Z79899 Other long term (current) drug therapy: Secondary | ICD-10-CM | POA: Diagnosis not present

## 2015-09-30 DIAGNOSIS — Z23 Encounter for immunization: Secondary | ICD-10-CM | POA: Diagnosis not present

## 2015-10-01 DIAGNOSIS — K219 Gastro-esophageal reflux disease without esophagitis: Secondary | ICD-10-CM | POA: Diagnosis not present

## 2015-10-01 DIAGNOSIS — F209 Schizophrenia, unspecified: Secondary | ICD-10-CM | POA: Diagnosis not present

## 2015-12-18 DIAGNOSIS — Z79899 Other long term (current) drug therapy: Secondary | ICD-10-CM | POA: Diagnosis not present

## 2015-12-18 DIAGNOSIS — F250 Schizoaffective disorder, bipolar type: Secondary | ICD-10-CM | POA: Diagnosis not present

## 2016-03-21 DIAGNOSIS — F250 Schizoaffective disorder, bipolar type: Secondary | ICD-10-CM | POA: Diagnosis not present

## 2016-03-21 DIAGNOSIS — Z79899 Other long term (current) drug therapy: Secondary | ICD-10-CM | POA: Diagnosis not present

## 2016-06-14 DIAGNOSIS — F250 Schizoaffective disorder, bipolar type: Secondary | ICD-10-CM | POA: Diagnosis not present

## 2016-06-14 DIAGNOSIS — Z79899 Other long term (current) drug therapy: Secondary | ICD-10-CM | POA: Diagnosis not present

## 2016-09-03 ENCOUNTER — Encounter (HOSPITAL_COMMUNITY): Payer: Self-pay

## 2016-09-03 ENCOUNTER — Emergency Department (HOSPITAL_COMMUNITY): Payer: Medicare Other

## 2016-09-03 ENCOUNTER — Emergency Department (HOSPITAL_COMMUNITY)
Admission: EM | Admit: 2016-09-03 | Discharge: 2016-09-03 | Disposition: A | Discharge status: Home / Self Care | Payer: Medicare Other | Attending: Emergency Medicine | Admitting: Emergency Medicine

## 2016-09-03 DIAGNOSIS — J45909 Unspecified asthma, uncomplicated: Secondary | ICD-10-CM | POA: Diagnosis not present

## 2016-09-03 DIAGNOSIS — J189 Pneumonia, unspecified organism: Secondary | ICD-10-CM

## 2016-09-03 DIAGNOSIS — F909 Attention-deficit hyperactivity disorder, unspecified type: Secondary | ICD-10-CM | POA: Insufficient documentation

## 2016-09-03 DIAGNOSIS — R05 Cough: Secondary | ICD-10-CM

## 2016-09-03 DIAGNOSIS — J181 Lobar pneumonia, unspecified organism: Secondary | ICD-10-CM | POA: Diagnosis not present

## 2016-09-03 DIAGNOSIS — R059 Cough, unspecified: Secondary | ICD-10-CM

## 2016-09-03 DIAGNOSIS — Z79899 Other long term (current) drug therapy: Secondary | ICD-10-CM | POA: Insufficient documentation

## 2016-09-03 MED ORDER — IPRATROPIUM-ALBUTEROL 0.5-2.5 (3) MG/3ML IN SOLN
3.0000 mL | Freq: Once | RESPIRATORY_TRACT | Status: AC
  Administered 2016-09-03: 3 mL via RESPIRATORY_TRACT
  Filled 2016-09-03: qty 3

## 2016-09-03 MED ORDER — ALBUTEROL SULFATE HFA 108 (90 BASE) MCG/ACT IN AERS: 1.0000 | INHALATION_SPRAY | Freq: Four times a day (QID) | RESPIRATORY_TRACT | 0 refills | Status: DC | PRN

## 2016-09-03 MED ORDER — ALBUTEROL SULFATE HFA 108 (90 BASE) MCG/ACT IN AERS
1.0000 | INHALATION_SPRAY | RESPIRATORY_TRACT | Status: DC | PRN
  Filled 2016-09-03: qty 6.7

## 2016-09-03 MED ORDER — DOXYCYCLINE HYCLATE 100 MG PO CAPS: 100.0000 mg | ORAL_CAPSULE | Freq: Two times a day (BID) | ORAL | 0 refills | Status: AC

## 2016-09-03 MED ORDER — DOXYCYCLINE HYCLATE 100 MG PO TABS
100.0000 mg | ORAL_TABLET | Freq: Once | ORAL | Status: AC
  Administered 2016-09-03: 100 mg via ORAL
  Filled 2016-09-03: qty 1

## 2016-09-03 NOTE — ED Provider Notes (Signed)
[facility] DEPT Provider Note   CSN: [id number] Arrival date & time: 09/03/16  1738     History   Chief Complaint Chief Complaint  Patient presents with  . URI    HPI Shane Gentry is a 41 y.o. male.  Level 5 caveat due to cognitive deficits (MR). Patient with history of asthma is brought in by group home [facility]. States pt developed a dry cough that "sounds gaggy", with observed difficulty breathing earlier today. No medications tried, and patient does not have albuterol inhaler because his asthma is not usually symptomatic. No known sick contacts. Denies fever, chills, LOC, HA, CP, abd pain, vomitting, nausea, no known sick contacts  HPI  Past Medical History:  Diagnosis Date  . ADD (attention deficit disorder)   . Anxiety   . Asthma   . Depression   . HLD (hyperlipidemia)   . Prostate cancer ([place])   . Schizoaffective disorder ([place])   . Seasonal allergies     Patient Active Problem List   Diagnosis Date Noted  . SBO (small bowel obstruction) ([place]) 09/24/2015  . Hyperthyroidism 02/24/2015  . Failure to thrive in adult 02/19/2015  . Schizoaffective disorder, depressive type ([place]) 02/18/2015  . Elevated lithium level 02/18/2015  . Constipation 02/18/2015  . Absence of bladder continence 02/06/2015  . 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 ESOPHAGOGASTRODUODENOSCOPY (EGD) WITH PROPOFOL N/A 04/16/2015   Procedure: ESOPHAGOGASTRODUODENOSCOPY (EGD) WITH PROPOFOL;  Surgeon: Josefine Class, MD;  Location: [facility] ENDOSCOPY;  Service: Endoscopy;  Laterality: N/A;  . NO PAST SURGERIES         Home Medications    Prior to Admission medications   Medication Sig Start Date End Date Taking? Authorizing Provider  atorvastatin (LIPITOR) 20 MG tablet Take 1 tablet (20 mg total) by mouth daily at 6 PM. 02/24/15   Jolanta B Pucilowska, MD  divalproex (DEPAKOTE) 500 MG DR tablet Take 2 tablets (1,000 mg total) by mouth at  bedtime. 02/24/15   Clovis Fredrickson, MD  docusate sodium (COLACE) 100 MG capsule Take 1 capsule (100 mg total) by mouth 2 (two) times daily. 02/24/15   Clovis Fredrickson, MD  doxycycline (VIBRAMYCIN) 100 MG capsule Take 1 capsule (100 mg total) by mouth 2 (two) times daily. 09/03/16 09/13/16  Robecca Fulgham A Bruk [place], PA-C  lithium 300 MG tablet Take 600 mg by mouth at bedtime.    Historical Provider, MD  lithium 600 MG capsule Take 1 capsule (600 mg total) by mouth at bedtime. Patient not taking: Reported on 09/24/2015 02/24/15   Clovis Fredrickson, MD  LORazepam (ATIVAN) 2 MG tablet Take 2 mg by mouth 2 (two) times daily.    Historical Provider, MD  metoprolol succinate (TOPROL-XL) 25 MG 24 hr tablet Take 1 tablet (25 mg total) by mouth daily. 02/24/15   Clovis Fredrickson, MD  montelukast (SINGULAIR) 10 MG tablet Take 1 tablet (10 mg total) by mouth at bedtime. 02/24/15   Clovis Fredrickson, MD  omeprazole (PRILOSEC) 40 MG capsule Take 40 mg by mouth daily.    Historical Provider, MD  polyethylene glycol (MIRALAX / GLYCOLAX) packet Take 17 g by mouth daily. 02/24/15   Clovis Fredrickson, MD  risperiDONE microspheres (RISPERDAL CONSTA) 50 MG injection Inject 2 mLs (50 mg total) into the muscle every 14 (fourteen) days. 02/24/15   Jolanta B Pucilowska, MD  temazepam (RESTORIL) 30 MG capsule Take 1 capsule (30 mg total) by mouth at bedtime. 02/24/15  Clovis Fredrickson, MD    Family History No family history on file.  Social History Social History  Substance Use Topics  . Smoking status: Never Smoker  . Smokeless tobacco: Never Used  . Alcohol use No     Allergies   Sulfa antibiotics   Review of Systems Review of Systems  Unable to perform ROS: Psychiatric disorder (MR)     Physical Exam Updated Vital Signs BP 111/76 (BP Location: Right Arm)   Pulse 86   Temp 97.8 F (36.6 C) (Oral)   Resp 16   SpO2 97%   Physical Exam  Constitutional: He appears well-developed and  well-nourished. No distress.  Resting comfortably in bed  HENT:  Head: Normocephalic and atraumatic.  Right Ear: External ear normal.  Left Ear: External ear normal.  Nose: Nose normal.  Mouth/Throat: Oropharynx is clear and moist. No oropharyngeal exudate.  TMs normal bl  Eyes: Conjunctivae and EOM are normal. Pupils are equal, round, and reactive to light. Right eye exhibits no discharge. Left eye exhibits no discharge. No scleral icterus.  Neck: Normal range of motion. Neck supple. JVD present. Tracheal deviation present.  Pulmonary/Chest: Effort normal. He exhibits no tenderness.  Diffuse deep rumbling noted, no increased work of breathing. Dry cough noted.   Abdominal: Soft. Bowel sounds are normal. He exhibits no distension. There is no tenderness. There is no guarding.  Musculoskeletal: He exhibits no edema.  Moves extremities spontaneously  Lymphadenopathy:    He has no cervical adenopathy.  Neurological: He is alert.  No facial droop, at baseline mental status per group home director  Skin: Skin is warm. Capillary refill takes less than 2 seconds. He is not diaphoretic.  Psychiatric: He has a normal mood and affect. His speech is normal.     ED Treatments / Results  Labs (all labs ordered are listed, but only abnormal results are displayed) Labs Reviewed - No data to display  EKG  EKG Interpretation None       Radiology Dg Chest 2 View  Result Date: 09/03/2016 CLINICAL DATA:  Cough.  Gasping for air. EXAM: CHEST  2 VIEW COMPARISON:  02/04/2015 FINDINGS: Cardiomediastinal silhouette is normal. Mediastinal contours appear intact. There is no evidence of pleural effusion or pneumothorax. There is crowding of the interstitium, which may be seen with interstitial pulmonary edema or infiltrate. More focal airspace consolidation is seen overlying the right mid thorax on the frontal view. Osseous structures are without acute abnormality. Soft tissues are grossly normal.  IMPRESSION: Increased interstitial markings which may be seen with interstitial pulmonary edema or infiltrate. Focal airspace consolidation in the right mid lung field. Electronically Signed   By: Fidela Salisbury M.D.   On: 09/03/2016 18:15    Procedures Procedures (including critical care time)  Medications Ordered in ED Medications  ipratropium-albuterol (DUONEB) 0.5-2.5 (3) MG/3ML nebulizer solution 3 mL (3 mLs Nebulization Given 09/03/16 1823)  doxycycline (VIBRA-TABS) tablet 100 mg (100 mg Oral Given 09/03/16 1913)     Initial Impression / Assessment and Plan / ED Course  I have reviewed the triage vital signs and the nursing notes.  Pertinent labs & imaging results that were available during my care of the patient were reviewed by me and considered in my medical decision making (see chart for details).     Patient with history of MR presents with group home director with chief complaint cough and service of breath. Patient afebrile, vital signs stable, patent airway, SpO2 97% RA. Diffuse rumbling heard  on auscultation of lungs. Chest x-ray shows increased interstitial markings and focal airspace consolidation of the right middle lobe, possibly indicating early pneumonia. Breathing treatment provided, and on reevaluation group home director states patient's breathing has improved significantly and he is not complaining of shortness of breath. Resolution of rumbling sound and no wheezing on reevaluation. Albuterol inhaler provided and recommend use as needed.Will initiate course of doxycycline for CAP. Patient will follow up with primary care for reevaluation. Discussed strict ED return precautions. Pt's caregivers verbalized understanding of and agreement with plan and pt isis safe for discharge home at this time.   Final Clinical Impressions(s) / ED Diagnoses   Final diagnoses:  Cough  Community acquired pneumonia of right middle lobe of lung ([place])    New  Prescriptions Discharge Medication List as of 09/03/2016  6:59 PM    START taking these medications   Details  doxycycline (VIBRAMYCIN) 100 MG capsule Take 1 capsule (100 mg total) by mouth 2 (two) times daily., Starting Sat 09/03/2016, Until Tue 09/13/2016, [place], PA-C 09/03/16 2230    [facility] Ferguson, MD 09/05/16 1300

## 2016-09-03 NOTE — Discharge Instructions (Signed)
Please take all of your antibiotics until finished!   You may develop abdominal discomfort or diarrhea from the antibiotic.  You may help offset this with probiotics which you can buy or get in yogurt. Do not eat  or take the probiotics until 2 hours after your antibiotic.   

## 2016-09-03 NOTE — ED Triage Notes (Signed)
He is brought to [place] from a group home by a group home worker and [employer] with c/o that pt. Has been "gasping for air". Pt. Arrives here in no distress nor stridor, telling us he has had a "bad cough" for a couple of days.

## 2016-09-26 DIAGNOSIS — F250 Schizoaffective disorder, bipolar type: Secondary | ICD-10-CM | POA: Diagnosis not present

## 2016-09-26 DIAGNOSIS — Z79899 Other long term (current) drug therapy: Secondary | ICD-10-CM | POA: Diagnosis not present

## 2016-11-09 ENCOUNTER — Encounter (HOSPITAL_COMMUNITY): Payer: Self-pay

## 2016-11-09 ENCOUNTER — Emergency Department (HOSPITAL_COMMUNITY)
Admission: EM | Admit: 2016-11-09 | Discharge: 2016-11-09 | Disposition: A | Discharge status: Home / Self Care | Payer: Medicare Other | Attending: Emergency Medicine | Admitting: Emergency Medicine

## 2016-11-09 ENCOUNTER — Emergency Department (HOSPITAL_COMMUNITY): Payer: Medicare Other

## 2016-11-09 DIAGNOSIS — J209 Acute bronchitis, unspecified: Secondary | ICD-10-CM | POA: Diagnosis not present

## 2016-11-09 DIAGNOSIS — J40 Bronchitis, not specified as acute or chronic: Secondary | ICD-10-CM | POA: Diagnosis not present

## 2016-11-09 DIAGNOSIS — Z79899 Other long term (current) drug therapy: Secondary | ICD-10-CM | POA: Insufficient documentation

## 2016-11-09 DIAGNOSIS — E785 Hyperlipidemia, unspecified: Secondary | ICD-10-CM | POA: Diagnosis not present

## 2016-11-09 DIAGNOSIS — R197 Diarrhea, unspecified: Secondary | ICD-10-CM | POA: Diagnosis not present

## 2016-11-09 DIAGNOSIS — F259 Schizoaffective disorder, unspecified: Secondary | ICD-10-CM | POA: Insufficient documentation

## 2016-11-09 DIAGNOSIS — J45909 Unspecified asthma, uncomplicated: Secondary | ICD-10-CM | POA: Diagnosis not present

## 2016-11-09 DIAGNOSIS — J069 Acute upper respiratory infection, unspecified: Secondary | ICD-10-CM | POA: Diagnosis not present

## 2016-11-09 DIAGNOSIS — J029 Acute pharyngitis, unspecified: Secondary | ICD-10-CM | POA: Diagnosis present

## 2016-11-09 LAB — CBC WITH DIFFERENTIAL/PLATELET
BASOS ABS: 0 10*3/uL (ref 0.0–0.1)
BASOS PCT: 0 %
EOS PCT: 1 %
Eosinophils Absolute: 0 10*3/uL (ref 0.0–0.7)
HCT: 41.5 % (ref 39.0–52.0)
Hemoglobin: 13.3 g/dL (ref 13.0–17.0)
Lymphocytes Relative: 26 %
Lymphs Abs: 2.1 10*3/uL (ref 0.7–4.0)
MCH: 30 pg (ref 26.0–34.0)
MCHC: 32 g/dL (ref 30.0–36.0)
MCV: 93.5 fL (ref 78.0–100.0)
Monocytes Absolute: 0.8 10*3/uL (ref 0.1–1.0)
Monocytes Relative: 10 %
Neutro Abs: 5.1 10*3/uL (ref 1.7–7.7)
Neutrophils Relative %: 63 %
PLATELETS: 134 10*3/uL — AB (ref 150–400)
RBC: 4.44 MIL/uL (ref 4.22–5.81)
RDW: 14.1 % (ref 11.5–15.5)
WBC: 8.1 10*3/uL (ref 4.0–10.5)

## 2016-11-09 LAB — COMPREHENSIVE METABOLIC PANEL
ALBUMIN: 3.7 g/dL (ref 3.5–5.0)
ALT: 7 U/L — AB (ref 17–63)
AST: 14 U/L — AB (ref 15–41)
Alkaline Phosphatase: 20 U/L — ABNORMAL LOW (ref 38–126)
Anion gap: 6 (ref 5–15)
CHLORIDE: 102 mmol/L (ref 101–111)
CO2: 26 mmol/L (ref 22–32)
CREATININE: 1.13 mg/dL (ref 0.61–1.24)
Calcium: 9.7 mg/dL (ref 8.9–10.3)
GFR calc Af Amer: >60 mL/min (ref >60)
GLUCOSE: 113 mg/dL — AB (ref 65–99)
Potassium: 3.9 mmol/L (ref 3.5–5.1)
SODIUM: 134 mmol/L — AB (ref 135–145)
Total Bilirubin: 0.6 mg/dL (ref 0.3–1.2)
Total Protein: 6.9 g/dL (ref 6.5–8.1)

## 2016-11-09 LAB — LIPASE, BLOOD: Lipase: 27 U/L (ref 11–51)

## 2016-11-09 LAB — RAPID STREP SCREEN (MED CTR MEBANE ONLY): STREPTOCOCCUS, GROUP A SCREEN (DIRECT): NEGATIVE

## 2016-11-09 MED ORDER — AEROCHAMBER Z-STAT PLUS/MEDIUM MISC
1.0000 | Freq: Once | Status: DC
  Filled 2016-11-09: qty 1

## 2016-11-09 MED ORDER — BENZONATATE 100 MG PO CAPS: 100.0000 mg | ORAL_CAPSULE | Freq: Three times a day (TID) | ORAL | 0 refills | Status: DC

## 2016-11-09 MED ORDER — AEROCHAMBER PLUS FLO-VU LARGE MISC
Status: AC
  Filled 2016-11-09: qty 1

## 2016-11-09 MED ORDER — ALBUTEROL SULFATE HFA 108 (90 BASE) MCG/ACT IN AERS
2.0000 | INHALATION_SPRAY | Freq: Once | RESPIRATORY_TRACT | Status: AC
  Administered 2016-11-09: 2 via RESPIRATORY_TRACT
  Filled 2016-11-09: qty 6.7

## 2016-11-09 MED ORDER — GUAIFENESIN-DM 100-10 MG/5ML PO SYRP: 5.0000 mL | ORAL_SOLUTION | ORAL | 0 refills | Status: DC | PRN

## 2016-11-09 NOTE — ED Notes (Signed)
Pt c/o cough starting this morning, when asked if he has any pain, pt stated "my throat is sore".

## 2016-11-09 NOTE — Discharge Instructions (Signed)
Use inhaler with spacer, 2 puffs every 4 hours. Robitussin and Tessalon as prescribed for cough. Follow-up with family doctor in 2-3 days for recheck. Return if worsening.

## 2016-11-09 NOTE — ED Triage Notes (Signed)
Pt brought in from group home with caregiver. Pt has been complaining to staff of cough and abdominal pain. Pt only reports cough in triage but when asked if having abd pain he states "a little bit." No acute distress noted.

## 2016-11-09 NOTE — ED Provider Notes (Signed)
[facility] DEPT Provider Note   CSN: [id number] Arrival date & time: 11/09/16  1551     History   Chief Complaint Chief Complaint  Patient presents with  . Cough  . Abdominal Pain    HPI Shane Gentry is a 41 y.o. male.  HPI Shane Gentry is a 41 y.o. male with history of attention deficit disorder, depression, schizoaffective disorder, mental retardation, asthma, presents to emergency department complaining of sore throat, cough, abdominal pain. Patient's symptoms began this morning. Patient has not had any nausea or vomiting. Last bowel movement was yesterday. Cough sounds nonproductive. Patient denies any pain other than his throat. No medications given prior to presentation here.  Past Medical History:  Diagnosis Date  . ADD (attention deficit disorder)   . Anxiety   . Asthma   . Depression   . HLD (hyperlipidemia)   . Prostate cancer ([place])   . Schizoaffective disorder ([place])   . Seasonal allergies     Patient Active Problem List   Diagnosis Date Noted  . SBO (small bowel obstruction) ([place]) 09/24/2015  . Hyperthyroidism 02/24/2015  . Failure to thrive in adult 02/19/2015  . Schizoaffective disorder, depressive type ([place]) 02/18/2015  . Elevated lithium level 02/18/2015  . Constipation 02/18/2015  . Absence of bladder continence 02/06/2015  . 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 ESOPHAGOGASTRODUODENOSCOPY (EGD) WITH PROPOFOL N/A 04/16/2015   Procedure: ESOPHAGOGASTRODUODENOSCOPY (EGD) WITH PROPOFOL;  Surgeon: Josefine Class, MD;  Location[facility] END[facility];  Service: Endoscopy;  Laterality: N/A;  . NO PAST SURGERIES         Home Medications    Prior to Admission medications   Medication Sig Start Date End Date Taking? Authorizing Provider  atorvastatin (LIPITOR) 20 MG tablet Take 1 tablet (20 mg total) by mouth daily at 6 PM. 02/24/15  Yes Pucilowska, Jolanta B, MD  divalproex (DEPAKOTE) 500 MG DR  tablet Take 2 tablets (1,000 mg total) by mouth at bedtime. 02/24/15  Yes Pucilowska, Jolanta B, MD  docusate sodium (COLACE) 100 MG capsule Take 1 capsule (100 mg total) by mouth 2 (two) times daily. 02/24/15  Yes Pucilowska, Jolanta B, MD  lithium carbonate 300 MG capsule Take 600 mg by mouth at bedtime.   Yes [provider]  LORazepam (ATIVAN) 2 MG tablet Take 2 mg by mouth 2 (two) times daily.   Yes [provider]  metoprolol succinate (TOPROL-XL) 25 MG 24 hr tablet Take 1 tablet (25 mg total) by mouth daily. 02/24/15  Yes Pucilowska, Jolanta B, MD  montelukast (SINGULAIR) 10 MG tablet Take 1 tablet (10 mg total) by mouth at bedtime. 02/24/15  Yes Pucilowska, Jolanta B, MD  omeprazole (PRILOSEC) 40 MG capsule Take 40 mg by mouth daily.   Yes [provider]  polyethylene glycol (MIRALAX / GLYCOLAX) packet Take 17 g by mouth daily. Patient taking differently: Take 17 g by mouth daily. Mix with 8 oz liquid and drink 02/24/15  Yes Pucilowska, Jolanta B, MD  risperiDONE microspheres (RISPERDAL CONSTA) 50 MG injection Inject 2 mLs (50 mg total) into the muscle every 14 (fourteen) days. Patient taking differently: Inject 50 mg into the muscle every 14 (fourteen) days. Every other Thursday 02/24/15  Yes Pucilowska, Jolanta B, MD  temazepam (RESTORIL) 30 MG capsule Take 1 capsule (30 mg total) by mouth at bedtime. 02/24/15  Yes Pucilowska, Jolanta B, MD  lithium 600 MG capsule Take 1 capsule (600 mg total) by mouth at bedtime.  Patient not taking: Reported on 09/24/2015 02/24/15   Clovis Fredrickson, MD    Family History History reviewed. No pertinent family history.  Social History Social History  Substance Use Topics  . Smoking status: Never Smoker  . Smokeless tobacco: Never Used  . Alcohol use No     Allergies   Sulfa antibiotics   Review of Systems Review of Systems  Constitutional: Negative for chills and fever.  HENT: Positive for congestion and  sore throat.   Respiratory: Positive for cough. Negative for chest tightness and shortness of breath.   Cardiovascular: Negative for chest pain, palpitations and leg swelling.  Gastrointestinal: Positive for abdominal pain. Negative for abdominal distention, diarrhea, nausea and vomiting.  Genitourinary: Negative for dysuria, frequency, hematuria and urgency.  Musculoskeletal: Negative for arthralgias, myalgias, neck pain and neck stiffness.  Skin: Negative for rash.  Allergic/Immunologic: Negative for immunocompromised state.  Neurological: Negative for dizziness, weakness, light-headedness, numbness and headaches.  All other systems reviewed and are negative.    Physical Exam Updated Vital Signs BP (!) 102/55 (BP Location: Right Arm)   Pulse 60   Temp 98.5 F (36.9 C) (Oral)   Resp 18   SpO2 100%   Physical Exam  Constitutional: He is oriented to person, place, and time. He appears well-developed and well-nourished. No distress.  HENT:  Head: Normocephalic and atraumatic.  Oropharynx erythematous, no exit date. Tonsils are normal. Uvula midline  Eyes: Conjunctivae are normal.  Neck: Neck supple.  Cardiovascular: Normal rate, regular rhythm and normal heart sounds.   Pulmonary/Chest: Effort normal. No respiratory distress. He has wheezes. He has no rales.  Mild and expiratory wheezes bilaterally  Abdominal: Soft. Bowel sounds are normal. He exhibits no distension. There is no tenderness. There is no rebound.  Musculoskeletal: He exhibits no edema.  Neurological: He is alert and oriented to person, place, and time.  Skin: Skin is warm and dry.  Nursing note and vitals reviewed.    ED Treatments / Results  Labs (all labs ordered are listed, but only abnormal results are displayed) Labs Reviewed  CBC WITH DIFFERENTIAL/PLATELET - Abnormal; Notable for the following:       Result Value   Platelets 134 (*)    All other components within normal limits  COMPREHENSIVE  METABOLIC PANEL - Abnormal; Notable for the following:    Sodium 134 (*)    Glucose, Bld 113 (*)    BUN <5 (*)    AST 14 (*)    ALT 7 (*)    Alkaline Phosphatase 20 (*)    All other components within normal limits  RAPID STREP SCREEN (NOT AT [facility])  CULTURE, GROUP A STREP ([place])  LIPASE, BLOOD    EKG  EKG Interpretation None       Radiology Dg Abd Acute W/chest  Result Date: 11/09/2016 CLINICAL DATA:  Cough, epigastric pain and diarrhea x1 day. Pt denies nausea and vomiting. Hx of asthma, schizoaffective disorder. EXAM: DG ABDOMEN ACUTE W/ 1V CHEST COMPARISON:  09/03/2016 FINDINGS: Heart size is accentuated by the AP position of patient. The lungs are free of focal consolidations and pleural effusions. Supine and erect views of the abdomen demonstrate no free intraperitoneal air. Bowel gas pattern is nonobstructive. No organomegaly or abnormal calcifications. IMPRESSION: No evidence for acute  abnormality. Electronically Signed   By: Nolon Nations M.D.   On: 11/09/2016 17:29    Procedures Procedures (including critical care time)  Medications Ordered in ED Medications  albuterol (PROVENTIL HFA;VENTOLIN HFA)  108 (90 Base) MCG/ACT inhaler 2 puff (not administered)  aerochamber Z-Stat Plus/medium 1 each (not administered)     Initial Impression / Assessment and Plan / ED Course  I have reviewed the triage vital signs and the nursing notes.  Pertinent labs & imaging results that were available during my care of the patient were reviewed by me and considered in my medical decision making (see chart for details).     [facility] emergency department with sudden throat, cough, congestion, some abdominal pain. Abdomen is soft and exam, does not appear to be distended. Patient does have history of small bowel obstruction. Patient also has mild expiratory wheezes on exam. Will check labs, chest x-ray, abdominal x-ray.  X-rays negative. Labs unremarkable. Patient is drinking fluids,  eating crackers. No distress. Most likely viral syndrome. Will treat with Robitussin for cough, Tessalon Perles, inhaler. Follow-up with primary care doctor as needed.  Vitals:   11/09/16 1555 11/09/16 1615 11/09/16 1618 11/09/16 1858  BP: 118/72 (!) 102/55 (!) 102/55 124/82  Pulse: 67 (!) 58 60 60  Resp: 16 16 18 18   Temp: 98.5 F (36.9 C)   98.5 F (36.9 C)  TempSrc: Oral  Oral Oral  SpO2: 98% 100% 100% 100%     Final Clinical Impressions(s) / ED Diagnoses   Final diagnoses:  Upper respiratory tract infection, unspecified type  Bronchitis    New Prescriptions Discharge Medication List as of 11/09/2016  6:52 PM    START taking these medications   Details  benzonatate (TESSALON) 100 MG capsule Take 1 capsule (100 mg total) by mouth every 8 (eight) hours., Starting Wed 11/09/2016, Print    guaiFENesin-dextromethorphan (ROBITUSSIN DM) 100-10 MG/5ML syrup Take 5 mLs by mouth every 4 (four) hours as needed for cough., Starting Wed 11/09/2016, Print         Zacharie Portner, [place], PA-C 11/09/16 2127    Gareth Morgan, MD 11/11/16 1325

## 2016-11-09 NOTE — ED Notes (Signed)
Pt and caregiver demonstrated appropriate use of inhaler and spacer.

## 2016-11-12 LAB — CULTURE, GROUP A STREP (THRC)

## 2016-12-15 DIAGNOSIS — K219 Gastro-esophageal reflux disease without esophagitis: Secondary | ICD-10-CM | POA: Diagnosis not present

## 2016-12-15 DIAGNOSIS — F205 Residual schizophrenia: Secondary | ICD-10-CM | POA: Diagnosis not present

## 2016-12-27 DIAGNOSIS — Z79899 Other long term (current) drug therapy: Secondary | ICD-10-CM | POA: Diagnosis not present

## 2016-12-27 DIAGNOSIS — F250 Schizoaffective disorder, bipolar type: Secondary | ICD-10-CM | POA: Diagnosis not present

## 2017-03-24 ENCOUNTER — Emergency Department (HOSPITAL_COMMUNITY)
Admission: EM | Admit: 2017-03-24 | Discharge: 2017-03-24 | Disposition: A | Discharge status: Home / Self Care | Payer: Medicare Other | Source: Home / Self Care | Attending: Emergency Medicine | Admitting: Emergency Medicine

## 2017-03-24 ENCOUNTER — Emergency Department (HOSPITAL_COMMUNITY): Payer: Medicare Other

## 2017-03-24 ENCOUNTER — Encounter (HOSPITAL_COMMUNITY): Payer: Self-pay | Admitting: Nurse Practitioner

## 2017-03-24 ENCOUNTER — Other Ambulatory Visit: Payer: Self-pay

## 2017-03-24 DIAGNOSIS — J45909 Unspecified asthma, uncomplicated: Secondary | ICD-10-CM

## 2017-03-24 DIAGNOSIS — R350 Frequency of micturition: Secondary | ICD-10-CM

## 2017-03-24 DIAGNOSIS — J690 Pneumonitis due to inhalation of food and vomit: Secondary | ICD-10-CM | POA: Diagnosis not present

## 2017-03-24 DIAGNOSIS — A419 Sepsis, unspecified organism: Secondary | ICD-10-CM | POA: Diagnosis not present

## 2017-03-24 DIAGNOSIS — R270 Ataxia, unspecified: Secondary | ICD-10-CM | POA: Diagnosis not present

## 2017-03-24 DIAGNOSIS — R2681 Unsteadiness on feet: Secondary | ICD-10-CM | POA: Insufficient documentation

## 2017-03-24 DIAGNOSIS — Z79899 Other long term (current) drug therapy: Secondary | ICD-10-CM

## 2017-03-24 DIAGNOSIS — Z8546 Personal history of malignant neoplasm of prostate: Secondary | ICD-10-CM | POA: Insufficient documentation

## 2017-03-24 DIAGNOSIS — R2689 Other abnormalities of gait and mobility: Secondary | ICD-10-CM

## 2017-03-24 DIAGNOSIS — N179 Acute kidney failure, unspecified: Secondary | ICD-10-CM | POA: Diagnosis not present

## 2017-03-24 DIAGNOSIS — T68XXXA Hypothermia, initial encounter: Secondary | ICD-10-CM | POA: Diagnosis not present

## 2017-03-24 DIAGNOSIS — J189 Pneumonia, unspecified organism: Secondary | ICD-10-CM | POA: Diagnosis not present

## 2017-03-24 DIAGNOSIS — I959 Hypotension, unspecified: Secondary | ICD-10-CM | POA: Diagnosis not present

## 2017-03-24 DIAGNOSIS — E870 Hyperosmolality and hypernatremia: Secondary | ICD-10-CM | POA: Diagnosis not present

## 2017-03-24 DIAGNOSIS — I4892 Unspecified atrial flutter: Secondary | ICD-10-CM | POA: Diagnosis not present

## 2017-03-24 DIAGNOSIS — G9341 Metabolic encephalopathy: Secondary | ICD-10-CM | POA: Diagnosis not present

## 2017-03-24 LAB — BASIC METABOLIC PANEL
Anion gap: 4 — ABNORMAL LOW (ref 5–15)
BUN: 15 mg/dL (ref 6–20)
CHLORIDE: 112 mmol/L — AB (ref 101–111)
CO2: 29 mmol/L (ref 22–32)
CREATININE: 0.92 mg/dL (ref 0.61–1.24)
Calcium: 10.3 mg/dL (ref 8.9–10.3)
GFR calc Af Amer: >60 mL/min (ref >60)
GFR calc non Af Amer: >60 mL/min (ref >60)
GLUCOSE: 98 mg/dL (ref 65–99)
POTASSIUM: 4.2 mmol/L (ref 3.5–5.1)
SODIUM: 145 mmol/L (ref 135–145)

## 2017-03-24 LAB — URINALYSIS, ROUTINE W REFLEX MICROSCOPIC
Bilirubin Urine: NEGATIVE
Glucose, UA: NEGATIVE mg/dL
HGB URINE DIPSTICK: NEGATIVE
Ketones, ur: NEGATIVE mg/dL
Leukocytes, UA: NEGATIVE
Nitrite: NEGATIVE
PROTEIN: NEGATIVE mg/dL
SPECIFIC GRAVITY, URINE: 1.004 — AB (ref 1.005–1.030)
Squamous Epithelial / LPF: NONE SEEN
pH: 7 (ref 5.0–8.0)

## 2017-03-24 LAB — CBC
HEMATOCRIT: 42.2 % (ref 39.0–52.0)
HEMOGLOBIN: 13.6 g/dL (ref 13.0–17.0)
MCH: 30.1 pg (ref 26.0–34.0)
MCHC: 32.2 g/dL (ref 30.0–36.0)
MCV: 93.4 fL (ref 78.0–100.0)
Platelets: 96 10*3/uL — ABNORMAL LOW (ref 150–400)
RBC: 4.52 MIL/uL (ref 4.22–5.81)
RDW: 15.5 % (ref 11.5–15.5)
WBC: 7.5 10*3/uL (ref 4.0–10.5)

## 2017-03-24 LAB — LITHIUM LEVEL: LITHIUM LVL: 1.01 mmol/L (ref 0.60–1.20)

## 2017-03-24 LAB — VALPROIC ACID LEVEL: VALPROIC ACID LVL: 69 ug/mL (ref 50.0–100.0)

## 2017-03-24 NOTE — Discharge Instructions (Signed)
Follow up with your primary care doctor.  Continue your current medications.  Return as needed for worsening symptoms.

## 2017-03-24 NOTE — ED Triage Notes (Signed)
Patient has frequency with urination. Patient has been bed wetting at night and also getting up to pee as well. Patient has been having balance issues as well. No complains of dizziness.

## 2017-03-24 NOTE — ED Provider Notes (Signed)
[facility] DEPT Provider Note   CSN: [id number] Arrival date & time: 03/24/17  6010     History   Chief Complaint No chief complaint on file.   HPI Shane Gentry is a 41 y.o. male.  HPI Patient presents to the emergency room for evaluation of frequent urination and balance issues.  Patient has history of schizoaffective disorder.  Patient has diminished mental status and at baseline is not able to have any extensive conversations.  Patient denies any complaints.  Most of the history is provided by his caregiver.  She has noticed that he has been urinating very frequently, especially at night.  No mention of any early dyspnea.  She also feels that his balance is off.  He seems to be stumbling a bit when trying to walk.  No known recent injuries.  No complaints of headache.  No fever.  No vomiting or diarrhea.  Past Medical History:  Diagnosis Date  . ADD (attention deficit disorder)   . Anxiety   . Asthma   . Depression   . HLD (hyperlipidemia)   . Prostate cancer ([place])   . Schizoaffective disorder ([place])   . Seasonal allergies     Patient Active Problem List   Diagnosis Date Noted  . SBO (small bowel obstruction) ([place]) 09/24/2015  . Hyperthyroidism 02/24/2015  . Failure to thrive in adult 02/19/2015  . Schizoaffective disorder, depressive type ([place]) 02/18/2015  . Elevated lithium level 02/18/2015  . Constipation 02/18/2015  . Absence of bladder continence 02/06/2015  . 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 ESOPHAGOGASTRODUODENOSCOPY (EGD) WITH PROPOFOL N/A 04/16/2015   Performed by Josefine Class, MD at [facility]  . NO PAST SURGERIES         Home Medications    Prior to Admission medications   Medication Sig Start Date End Date Taking? Authorizing Provider  atorvastatin (LIPITOR) 20 MG tablet Take 1 tablet (20 mg total) by mouth daily at 6 PM. 02/24/15  Yes Pucilowska,  Jolanta B, MD  divalproex (DEPAKOTE) 500 MG DR tablet Take 2 tablets (1,000 mg total) by mouth at bedtime. 02/24/15  Yes Pucilowska, Jolanta B, MD  docusate sodium (COLACE) 100 MG capsule Take 1 capsule (100 mg total) by mouth 2 (two) times daily. 02/24/15  Yes Pucilowska, Jolanta B, MD  lithium 600 MG capsule Take 1 capsule (600 mg total) by mouth at bedtime. 02/24/15  Yes Pucilowska, Jolanta B, MD  lithium carbonate 300 MG capsule Take 600 mg by mouth at bedtime.   Yes [provider]  LORazepam (ATIVAN) 2 MG tablet Take 2 mg by mouth 2 (two) times daily.   Yes [provider]  metoprolol succinate (TOPROL-XL) 25 MG 24 hr tablet Take 1 tablet (25 mg total) by mouth daily. 02/24/15  Yes Pucilowska, Jolanta B, MD  montelukast (SINGULAIR) 10 MG tablet Take 1 tablet (10 mg total) by mouth at bedtime. 02/24/15  Yes Pucilowska, Jolanta B, MD  omeprazole (PRILOSEC) 40 MG capsule Take 40 mg by mouth daily.   Yes [provider]  polyethylene glycol (MIRALAX / GLYCOLAX) packet Take 17 g by mouth daily. Patient taking differently: Take 17 g by mouth daily. Mix with 8 oz liquid and drink 02/24/15  Yes Pucilowska, Jolanta B, MD  risperiDONE microspheres (RISPERDAL CONSTA) 50 MG injection Inject 2 mLs (50 mg total) into the muscle every 14 (fourteen) days. Patient taking differently: Inject 50 mg into the muscle every  14 (fourteen) days. Every other Thursday 02/24/15  Yes Pucilowska, Jolanta B, MD  temazepam (RESTORIL) 30 MG capsule Take 1 capsule (30 mg total) by mouth at bedtime. 02/24/15  Yes Pucilowska, Jolanta B, MD  benzonatate (TESSALON) 100 MG capsule Take 1 capsule (100 mg total) by mouth every 8 (eight) hours. Patient not taking: Reported on 03/24/2017 11/09/16   Jeannett Senior, PA-C  guaiFENesin-dextromethorphan (ROBITUSSIN DM) 100-10 MG/5ML syrup Take 5 mLs by mouth every 4 (four) hours as needed for cough. Patient not taking: Reported on 03/24/2017 11/09/16    Jeannett Senior, PA-C    Family History History reviewed. No pertinent family history.  Social History Social History   Tobacco Use  . Smoking status: Never Smoker  . Smokeless tobacco: Never Used  Substance Use Topics  . Alcohol use: No    Alcohol/week: 0.0 oz  . Drug use: No     Allergies   Sulfa antibiotics   Review of Systems Review of Systems  All other systems reviewed and are negative.    Physical Exam Updated Vital Signs BP 97/75   Pulse (!) 58   Resp 15   Ht 1.702 m (5\' 7" )   Wt 68 kg (150 lb)   SpO2 100%   BMI 23.49 kg/m   Physical Exam  Constitutional: He appears well-developed and well-nourished. No distress.  HENT:  Head: Normocephalic and atraumatic.  Right Ear: External ear normal.  Left Ear: External ear normal.  Mouth/Throat: Oropharynx is clear and moist.  Eyes: Conjunctivae are normal. Right eye exhibits no discharge. Left eye exhibits no discharge. No scleral icterus.  Neck: Neck supple. No tracheal deviation present.  Cardiovascular: Normal rate, regular rhythm and intact distal pulses.  Pulmonary/Chest: Effort normal and breath sounds normal. No stridor. No respiratory distress. He has no wheezes. He has no rales.  Abdominal: Soft. Bowel sounds are normal. He exhibits no distension. There is no tenderness. There is no rebound and no guarding.  Musculoskeletal: He exhibits no edema or tenderness.  Neurological: He is alert. He has normal strength. He displays tremor. No cranial nerve deficit (No facial droop, extraocular movements intact, tongue midline ) or sensory deficit. He exhibits normal muscle tone. He displays no seizure activity.  No pronator drift bilateral upper extrem, able to hold both legs off bed for 5 seconds, sensation intact in all extremities, no visual field cuts, no left or right sided neglect, some past pointing with finger to nose exam, no nystagmus noted, patient is able to walk without assistance but he has a  broad-based gait and does appear somewhat unsteady   Skin: Skin is warm and dry. No rash noted.  Psychiatric: He has a normal mood and affect.  Nursing note and vitals reviewed.    ED Treatments / Results  Labs (all labs ordered are listed, but only abnormal results are displayed) Labs Reviewed  CBC - Abnormal; Notable for the following components:      Result Value   Platelets 96 (*)    All other components within normal limits  BASIC METABOLIC PANEL - Abnormal; Notable for the following components:   Chloride 112 (*)    Anion gap 4 (*)    All other components within normal limits  URINALYSIS, ROUTINE W REFLEX MICROSCOPIC - Abnormal; Notable for the following components:   Color, Urine STRAW (*)    Specific Gravity, Urine 1.004 (*)    Bacteria, UA RARE (*)    All other components within normal limits  VALPROIC ACID  LEVEL  LITHIUM LEVEL    EKG  EKG Interpretation None       Radiology Ct Head Wo Contrast  Result Date: 03/24/2017 CLINICAL DATA:  Urinary frequency.  Ataxia. EXAM: CT HEAD WITHOUT CONTRAST TECHNIQUE: Contiguous axial images were obtained from the base of the skull through the vertex without intravenous contrast. COMPARISON:  02/04/2015 FINDINGS: Brain: No evidence of acute infarction, hemorrhage, hydrocephalus, extra-axial collection or mass lesion/mass effect. Generalized cerebral atrophy advanced for age. Mild periventricular white matter low attenuation as can be seen with microvascular disease. Vascular: No hyperdense vessel or unexpected calcification. Skull: No osseous abnormality. Sinuses/Orbits: Visualized paranasal sinuses are clear. Visualized mastoid sinuses are clear. Visualized orbits demonstrate no focal abnormality. Other: None IMPRESSION: 1. No acute intracranial pathology. Electronically Signed   By: Kathreen Devoid   On: 03/24/2017 10:52    Procedures Procedures (including critical care time)  Medications Ordered in ED Medications - No data to  display   Initial Impression / Assessment and Plan / ED Course  I have reviewed the triage vital signs and the nursing notes.  Pertinent labs & imaging results that were available during my care of the patient were reviewed by me and considered in my medical decision making (see chart for details).  Clinical Course as of Mar 24 1321  Fri Mar 24, 2017  2094 We will check CT scan to rule out any acute CNS abnormality.  I will also on lithium and valproic acid levels as it is possible that he could be having medication toxicity issues.  [JK]    Clinical Course User Index [JK] Dorie Rank, MD    Patient presented with urinary frequency and some new balance issues.  On exam he has some mild tremor but no definite focal neurologic deficits.  I suspect his symptoms could be related to his medications.  His Depakote and lithium levels however not acutely elevated.  I think the patient can safely follow-up with his primary care doctor for further evaluation.  Urinary issues could be related to his prostate.  Right now there is no evidence of any infection discussed outpatient follow-up with primary care doctor.  Final Clinical Impressions(s) / ED Diagnoses   Final diagnoses:  Urinary frequency  Balance problem    ED Discharge Orders    None       Dorie Rank, MD 03/24/17 1322

## 2017-03-27 ENCOUNTER — Encounter (HOSPITAL_COMMUNITY): Payer: Self-pay

## 2017-03-27 ENCOUNTER — Inpatient Hospital Stay (HOSPITAL_COMMUNITY)
Admission: EM | Admit: 2017-03-27 | Discharge: 2017-04-13 | DRG: 871 | Disposition: A | Discharge status: Skilled Nursing Facility | Payer: Medicare Other | Attending: Internal Medicine | Admitting: Internal Medicine

## 2017-03-27 ENCOUNTER — Emergency Department (HOSPITAL_COMMUNITY): Payer: Medicare Other

## 2017-03-27 ENCOUNTER — Other Ambulatory Visit: Payer: Self-pay

## 2017-03-27 DIAGNOSIS — R627 Adult failure to thrive: Secondary | ICD-10-CM | POA: Diagnosis not present

## 2017-03-27 DIAGNOSIS — E11649 Type 2 diabetes mellitus with hypoglycemia without coma: Secondary | ICD-10-CM | POA: Diagnosis present

## 2017-03-27 DIAGNOSIS — E875 Hyperkalemia: Secondary | ICD-10-CM | POA: Diagnosis not present

## 2017-03-27 DIAGNOSIS — I959 Hypotension, unspecified: Secondary | ICD-10-CM | POA: Diagnosis not present

## 2017-03-27 DIAGNOSIS — T502X5A Adverse effect of carbonic-anhydrase inhibitors, benzothiadiazides and other diuretics, initial encounter: Secondary | ICD-10-CM | POA: Diagnosis present

## 2017-03-27 DIAGNOSIS — M6281 Muscle weakness (generalized): Secondary | ICD-10-CM | POA: Diagnosis not present

## 2017-03-27 DIAGNOSIS — Z79899 Other long term (current) drug therapy: Secondary | ICD-10-CM

## 2017-03-27 DIAGNOSIS — F259 Schizoaffective disorder, unspecified: Secondary | ICD-10-CM | POA: Diagnosis not present

## 2017-03-27 DIAGNOSIS — K5909 Other constipation: Secondary | ICD-10-CM | POA: Diagnosis present

## 2017-03-27 DIAGNOSIS — I481 Persistent atrial fibrillation: Secondary | ICD-10-CM | POA: Diagnosis not present

## 2017-03-27 DIAGNOSIS — J45909 Unspecified asthma, uncomplicated: Secondary | ICD-10-CM | POA: Diagnosis not present

## 2017-03-27 DIAGNOSIS — A419 Sepsis, unspecified organism: Secondary | ICD-10-CM | POA: Diagnosis not present

## 2017-03-27 DIAGNOSIS — N179 Acute kidney failure, unspecified: Secondary | ICD-10-CM | POA: Diagnosis not present

## 2017-03-27 DIAGNOSIS — D696 Thrombocytopenia, unspecified: Secondary | ICD-10-CM | POA: Diagnosis present

## 2017-03-27 DIAGNOSIS — R000 Tachycardia, unspecified: Secondary | ICD-10-CM | POA: Diagnosis present

## 2017-03-27 DIAGNOSIS — E869 Volume depletion, unspecified: Secondary | ICD-10-CM | POA: Diagnosis present

## 2017-03-27 DIAGNOSIS — I10 Essential (primary) hypertension: Secondary | ICD-10-CM | POA: Diagnosis not present

## 2017-03-27 DIAGNOSIS — I480 Paroxysmal atrial fibrillation: Secondary | ICD-10-CM | POA: Diagnosis present

## 2017-03-27 DIAGNOSIS — R6520 Severe sepsis without septic shock: Secondary | ICD-10-CM | POA: Diagnosis not present

## 2017-03-27 DIAGNOSIS — I371 Nonrheumatic pulmonary valve insufficiency: Secondary | ICD-10-CM | POA: Diagnosis not present

## 2017-03-27 DIAGNOSIS — E870 Hyperosmolality and hypernatremia: Secondary | ICD-10-CM | POA: Diagnosis not present

## 2017-03-27 DIAGNOSIS — N390 Urinary tract infection, site not specified: Secondary | ICD-10-CM | POA: Diagnosis not present

## 2017-03-27 DIAGNOSIS — R680 Hypothermia, not associated with low environmental temperature: Secondary | ICD-10-CM | POA: Diagnosis present

## 2017-03-27 DIAGNOSIS — E1122 Type 2 diabetes mellitus with diabetic chronic kidney disease: Secondary | ICD-10-CM | POA: Diagnosis present

## 2017-03-27 DIAGNOSIS — T68XXXA Hypothermia, initial encounter: Secondary | ICD-10-CM | POA: Diagnosis not present

## 2017-03-27 DIAGNOSIS — T68XXXD Hypothermia, subsequent encounter: Secondary | ICD-10-CM | POA: Diagnosis not present

## 2017-03-27 DIAGNOSIS — N182 Chronic kidney disease, stage 2 (mild): Secondary | ICD-10-CM | POA: Diagnosis present

## 2017-03-27 DIAGNOSIS — R031 Nonspecific low blood-pressure reading: Secondary | ICD-10-CM | POA: Diagnosis not present

## 2017-03-27 DIAGNOSIS — F338 Other recurrent depressive disorders: Secondary | ICD-10-CM | POA: Diagnosis not present

## 2017-03-27 DIAGNOSIS — I4892 Unspecified atrial flutter: Secondary | ICD-10-CM | POA: Diagnosis not present

## 2017-03-27 DIAGNOSIS — R4182 Altered mental status, unspecified: Secondary | ICD-10-CM | POA: Diagnosis not present

## 2017-03-27 DIAGNOSIS — Z8546 Personal history of malignant neoplasm of prostate: Secondary | ICD-10-CM

## 2017-03-27 DIAGNOSIS — J690 Pneumonitis due to inhalation of food and vomit: Secondary | ICD-10-CM

## 2017-03-27 DIAGNOSIS — I9589 Other hypotension: Secondary | ICD-10-CM | POA: Diagnosis not present

## 2017-03-27 DIAGNOSIS — J189 Pneumonia, unspecified organism: Secondary | ICD-10-CM | POA: Diagnosis not present

## 2017-03-27 DIAGNOSIS — F251 Schizoaffective disorder, depressive type: Secondary | ICD-10-CM | POA: Diagnosis not present

## 2017-03-27 DIAGNOSIS — G9341 Metabolic encephalopathy: Secondary | ICD-10-CM | POA: Diagnosis present

## 2017-03-27 DIAGNOSIS — R1311 Dysphagia, oral phase: Secondary | ICD-10-CM | POA: Diagnosis present

## 2017-03-27 DIAGNOSIS — E785 Hyperlipidemia, unspecified: Secondary | ICD-10-CM | POA: Diagnosis present

## 2017-03-27 DIAGNOSIS — F419 Anxiety disorder, unspecified: Secondary | ICD-10-CM | POA: Diagnosis not present

## 2017-03-27 DIAGNOSIS — I129 Hypertensive chronic kidney disease with stage 1 through stage 4 chronic kidney disease, or unspecified chronic kidney disease: Secondary | ICD-10-CM | POA: Diagnosis present

## 2017-03-27 DIAGNOSIS — R2681 Unsteadiness on feet: Secondary | ICD-10-CM | POA: Diagnosis present

## 2017-03-27 DIAGNOSIS — N251 Nephrogenic diabetes insipidus: Secondary | ICD-10-CM | POA: Diagnosis not present

## 2017-03-27 DIAGNOSIS — R350 Frequency of micturition: Secondary | ICD-10-CM | POA: Diagnosis present

## 2017-03-27 DIAGNOSIS — E0590 Thyrotoxicosis, unspecified without thyrotoxic crisis or storm: Secondary | ICD-10-CM | POA: Diagnosis present

## 2017-03-27 DIAGNOSIS — F73 Profound intellectual disabilities: Secondary | ICD-10-CM | POA: Diagnosis not present

## 2017-03-27 DIAGNOSIS — R1312 Dysphagia, oropharyngeal phase: Secondary | ICD-10-CM | POA: Diagnosis not present

## 2017-03-27 DIAGNOSIS — Z882 Allergy status to sulfonamides status: Secondary | ICD-10-CM

## 2017-03-27 DIAGNOSIS — Z6822 Body mass index (BMI) 22.0-22.9, adult: Secondary | ICD-10-CM

## 2017-03-27 DIAGNOSIS — F909 Attention-deficit hyperactivity disorder, unspecified type: Secondary | ICD-10-CM | POA: Diagnosis not present

## 2017-03-27 DIAGNOSIS — R040 Epistaxis: Secondary | ICD-10-CM | POA: Diagnosis not present

## 2017-03-27 LAB — TROPONIN I
Troponin I: <0.03 ng/mL (ref <0.03)
Troponin I: <0.03 ng/mL (ref <0.03)

## 2017-03-27 LAB — COMPREHENSIVE METABOLIC PANEL
ALBUMIN: 2.9 g/dL — AB (ref 3.5–5.0)
ALT: 15 U/L — AB (ref 17–63)
AST: 20 U/L (ref 15–41)
Alkaline Phosphatase: 21 U/L — ABNORMAL LOW (ref 38–126)
Anion gap: 4 — ABNORMAL LOW (ref 5–15)
BUN: 17 mg/dL (ref 6–20)
CHLORIDE: 115 mmol/L — AB (ref 101–111)
CO2: 26 mmol/L (ref 22–32)
CREATININE: 1.21 mg/dL (ref 0.61–1.24)
Calcium: 9.4 mg/dL (ref 8.9–10.3)
GFR calc Af Amer: >60 mL/min (ref >60)
GLUCOSE: 65 mg/dL (ref 65–99)
POTASSIUM: 4.1 mmol/L (ref 3.5–5.1)
Sodium: 145 mmol/L (ref 135–145)
Total Bilirubin: 0.5 mg/dL (ref 0.3–1.2)
Total Protein: 6 g/dL — ABNORMAL LOW (ref 6.5–8.1)

## 2017-03-27 LAB — VALPROIC ACID LEVEL: Valproic Acid Lvl: 38 ug/mL — ABNORMAL LOW (ref 50.0–100.0)

## 2017-03-27 LAB — CBC WITH DIFFERENTIAL/PLATELET
Basophils Absolute: 0 10*3/uL (ref 0.0–0.1)
Basophils Relative: 0 %
EOS PCT: 1 %
Eosinophils Absolute: 0 10*3/uL (ref 0.0–0.7)
HEMATOCRIT: 43.5 % (ref 39.0–52.0)
Hemoglobin: 13.8 g/dL (ref 13.0–17.0)
LYMPHS ABS: 0.6 10*3/uL — AB (ref 0.7–4.0)
LYMPHS PCT: 13 %
MCH: 30.3 pg (ref 26.0–34.0)
MCHC: 31.7 g/dL (ref 30.0–36.0)
MCV: 95.6 fL (ref 78.0–100.0)
MONO ABS: 0.7 10*3/uL (ref 0.1–1.0)
MONOS PCT: 17 %
NEUTROS ABS: 3 10*3/uL (ref 1.7–7.7)
Neutrophils Relative %: 70 %
PLATELETS: 74 10*3/uL — AB (ref 150–400)
RBC: 4.55 MIL/uL (ref 4.22–5.81)
RDW: 16.4 % — AB (ref 11.5–15.5)
WBC: 4.3 10*3/uL (ref 4.0–10.5)

## 2017-03-27 LAB — RAPID URINE DRUG SCREEN, HOSP PERFORMED
AMPHETAMINES: NOT DETECTED
BARBITURATES: NOT DETECTED
Benzodiazepines: POSITIVE — AB
Cocaine: NOT DETECTED
Opiates: NOT DETECTED
TETRAHYDROCANNABINOL: NOT DETECTED

## 2017-03-27 LAB — URINALYSIS, ROUTINE W REFLEX MICROSCOPIC
BILIRUBIN URINE: NEGATIVE
GLUCOSE, UA: NEGATIVE mg/dL
HGB URINE DIPSTICK: NEGATIVE
Ketones, ur: NEGATIVE mg/dL
LEUKOCYTES UA: NEGATIVE
Nitrite: NEGATIVE
PROTEIN: NEGATIVE mg/dL
Specific Gravity, Urine: 1.011 (ref 1.005–1.030)
pH: 5 (ref 5.0–8.0)

## 2017-03-27 LAB — CREATININE, SERUM: CREATININE: 1.05 mg/dL (ref 0.61–1.24)

## 2017-03-27 LAB — I-STAT CG4 LACTIC ACID, ED
Lactic Acid, Venous: 1.35 mmol/L (ref 0.5–1.9)
Lactic Acid, Venous: 1.04 mmol/L (ref 0.5–1.9)

## 2017-03-27 LAB — LACTIC ACID, PLASMA: Lactic Acid, Venous: 1.1 mmol/L (ref 0.5–1.9)

## 2017-03-27 LAB — MAGNESIUM: MAGNESIUM: 2.1 mg/dL (ref 1.7–2.4)

## 2017-03-27 LAB — PROTIME-INR
INR: 1.16
Prothrombin Time: 14.7 seconds (ref 11.4–15.2)

## 2017-03-27 LAB — PROCALCITONIN: Procalcitonin: 1.27 ng/mL

## 2017-03-27 LAB — APTT: APTT: 48 s — AB (ref 24–36)

## 2017-03-27 MED ORDER — VANCOMYCIN HCL IN DEXTROSE 1-5 GM/200ML-% IV SOLN
1000.0000 mg | Freq: Once | INTRAVENOUS | Status: AC
  Administered 2017-03-27: 1000 mg via INTRAVENOUS
  Filled 2017-03-27: qty 200

## 2017-03-27 MED ORDER — DEXTROSE-NACL 5-0.9 % IV SOLN
INTRAVENOUS | Status: DC
  Administered 2017-03-27 – 2017-03-28 (×2): via INTRAVENOUS

## 2017-03-27 MED ORDER — SODIUM CHLORIDE 0.9 % IV BOLUS (SEPSIS)
1000.0000 mL | Freq: Once | INTRAVENOUS | Status: AC
  Administered 2017-03-27: 1000 mL via INTRAVENOUS

## 2017-03-27 MED ORDER — SODIUM CHLORIDE 0.9 % IV SOLN
Freq: Once | INTRAVENOUS | Status: AC
  Administered 2017-03-27: 20:00:00 via INTRAVENOUS

## 2017-03-27 MED ORDER — DIVALPROEX SODIUM 500 MG PO DR TAB
1000.0000 mg | DELAYED_RELEASE_TABLET | Freq: Every day | ORAL | Status: DC
  Administered 2017-03-27 – 2017-03-30 (×4): 1000 mg via ORAL
  Filled 2017-03-27: qty 2
  Filled 2017-03-27 (×2): qty 4
  Filled 2017-03-27 (×2): qty 2

## 2017-03-27 MED ORDER — HYDROCORTISONE NA SUCCINATE PF 100 MG IJ SOLR
100.0000 mg | Freq: Three times a day (TID) | INTRAMUSCULAR | Status: DC
  Administered 2017-03-27 – 2017-03-29 (×5): 100 mg via INTRAVENOUS
  Filled 2017-03-27 (×5): qty 2

## 2017-03-27 MED ORDER — HEPARIN SODIUM (PORCINE) 5000 UNIT/ML IJ SOLN
5000.0000 [IU] | Freq: Three times a day (TID) | INTRAMUSCULAR | Status: DC
  Administered 2017-03-27 – 2017-04-13 (×42): 5000 [IU] via SUBCUTANEOUS
  Filled 2017-03-27 (×46): qty 1

## 2017-03-27 MED ORDER — ONDANSETRON HCL 4 MG/2ML IJ SOLN: 4.0000 mg | Freq: Four times a day (QID) | INTRAMUSCULAR | Status: DC | PRN

## 2017-03-27 MED ORDER — LITHIUM CARBONATE 300 MG PO CAPS
600.0000 mg | ORAL_CAPSULE | Freq: Every day | ORAL | Status: DC
  Administered 2017-03-28 – 2017-03-30 (×4): 600 mg via ORAL
  Filled 2017-03-27 (×5): qty 2

## 2017-03-27 MED ORDER — ATORVASTATIN CALCIUM 20 MG PO TABS
20.0000 mg | ORAL_TABLET | Freq: Every day | ORAL | Status: DC
  Administered 2017-03-28 – 2017-03-30 (×2): 20 mg via ORAL
  Filled 2017-03-27 (×2): qty 1

## 2017-03-27 MED ORDER — SODIUM CHLORIDE 0.9 % IV BOLUS (SEPSIS)
30.0000 mL/kg | Freq: Once | INTRAVENOUS | Status: AC
  Administered 2017-03-27: 2040 mL via INTRAVENOUS

## 2017-03-27 MED ORDER — PANTOPRAZOLE SODIUM 40 MG PO TBEC
40.0000 mg | DELAYED_RELEASE_TABLET | Freq: Every day | ORAL | Status: DC
  Administered 2017-03-29 – 2017-03-30 (×2): 40 mg via ORAL
  Filled 2017-03-27 (×2): qty 1

## 2017-03-27 MED ORDER — ACETAMINOPHEN 325 MG PO TABS
650.0000 mg | ORAL_TABLET | Freq: Four times a day (QID) | ORAL | Status: DC | PRN
  Administered 2017-03-29: 650 mg via ORAL
  Filled 2017-03-27: qty 2

## 2017-03-27 MED ORDER — MONTELUKAST SODIUM 10 MG PO TABS
10.0000 mg | ORAL_TABLET | Freq: Every day | ORAL | Status: DC
  Administered 2017-03-28 – 2017-04-12 (×17): 10 mg via ORAL
  Filled 2017-03-27 (×18): qty 1

## 2017-03-27 MED ORDER — LORAZEPAM 1 MG PO TABS
2.0000 mg | ORAL_TABLET | Freq: Two times a day (BID) | ORAL | Status: DC
  Administered 2017-03-27 – 2017-03-30 (×6): 2 mg via ORAL
  Filled 2017-03-27 (×6): qty 2

## 2017-03-27 MED ORDER — SODIUM CHLORIDE 0.9 % IV BOLUS (SEPSIS)
500.0000 mL | Freq: Once | INTRAVENOUS | Status: AC
  Administered 2017-03-27: 500 mL via INTRAVENOUS

## 2017-03-27 MED ORDER — ACETAMINOPHEN 650 MG RE SUPP: 650.0000 mg | Freq: Four times a day (QID) | RECTAL | Status: DC | PRN

## 2017-03-27 MED ORDER — TEMAZEPAM 15 MG PO CAPS
30.0000 mg | ORAL_CAPSULE | Freq: Every day | ORAL | Status: DC
  Administered 2017-03-28 – 2017-03-30 (×4): 30 mg via ORAL
  Filled 2017-03-27 (×5): qty 2

## 2017-03-27 MED ORDER — VANCOMYCIN HCL IN DEXTROSE 750-5 MG/150ML-% IV SOLN
750.0000 mg | Freq: Two times a day (BID) | INTRAVENOUS | Status: DC
  Administered 2017-03-28 – 2017-03-30 (×5): 750 mg via INTRAVENOUS
  Filled 2017-03-27 (×8): qty 150

## 2017-03-27 MED ORDER — ONDANSETRON HCL 4 MG PO TABS: 4.0000 mg | ORAL_TABLET | Freq: Four times a day (QID) | ORAL | Status: DC | PRN

## 2017-03-27 MED ORDER — LEVALBUTEROL HCL 0.63 MG/3ML IN NEBU
0.6300 mg | INHALATION_SOLUTION | Freq: Four times a day (QID) | RESPIRATORY_TRACT | Status: DC | PRN
  Administered 2017-04-09: 0.63 mg via RESPIRATORY_TRACT
  Filled 2017-03-27: qty 3

## 2017-03-27 MED ORDER — PIPERACILLIN-TAZOBACTAM 3.375 G IVPB 30 MIN
3.3750 g | Freq: Once | INTRAVENOUS | Status: AC
  Administered 2017-03-27: 3.375 g via INTRAVENOUS
  Filled 2017-03-27: qty 50

## 2017-03-27 MED ORDER — PIPERACILLIN-TAZOBACTAM 3.375 G IVPB
3.3750 g | Freq: Three times a day (TID) | INTRAVENOUS | Status: DC
  Administered 2017-03-27 – 2017-03-30 (×7): 3.375 g via INTRAVENOUS
  Filled 2017-03-27 (×9): qty 50

## 2017-03-27 MED ORDER — HYDROCORTISONE NA SUCCINATE PF 100 MG IJ SOLR: 50.0000 mg | Freq: Three times a day (TID) | INTRAMUSCULAR | Status: DC

## 2017-03-27 NOTE — ED Notes (Signed)
Warm fluids initiated per MD Director of group home bedside

## 2017-03-27 NOTE — ED Notes (Signed)
Shane Gentry (home IT sales professional) cell [phone] Pt's legal guardian is [employer]. Contact: [phone] (Cassandra or Erica) Left by Shane Gentry just in case "something really bad happens"

## 2017-03-27 NOTE — ED Triage Notes (Signed)
Pt arrives to ED [facility] urgent care via [facility] with complaints of altered metnal status with lethargy since x4 days. Pt states he was seen in the ED last week for "altered gait" but was sent home. Pt placed in position of comfort with bed locked and lowered, call bell in reach. Per urgent care, pt's core temp read 28F. Pt is oriented to self and situation, which is his baseline. Pt not oriented to time.

## 2017-03-27 NOTE — Progress Notes (Addendum)
Pharmacy Antibiotic Note  Shane Gentry is a 41 y.o. male admitted on 03/27/2017 with pneumonia.  Pharmacy has been consulted for vancomycin and Zosyn dosing. WBC 4.3, Temp 89.2, BP 76/63, CrCl 73ml/min  Plan: Vancomycin 1000mg  IV x1 in ED then  750mg  IV every 12 hours.  Goal trough 15-20 mcg/mL. Zosyn 3.375g IV q8h (4 hour infusion).  Height: 5\' 9"  (175.3 cm) Weight: 150 lb (68 kg) IBW/kg (Calculated) : 70.7  Temp (24hrs), Avg:88.6 F (31.4 C), Min:88 F (31.1 C), Max:89.2 F (31.8 C)  Recent Labs  Lab 03/24/17 1017 03/27/17 1241 03/27/17 1310 03/27/17 1508  WBC 7.5 4.3  --   --   CREATININE 0.92 1.21  --   --   LATICACIDVEN  --   --  1.35 1.04    Estimated Creatinine Clearance: 75.7 mL/min (by C-G formula based on SCr of 1.21 mg/dL).    Allergies  Allergen Reactions  . Sulfa Antibiotics Hives   Thank you for allowing pharmacy to be a part of this patient's care.  Jodean Lima Chalsey Leeth 03/27/2017 4:29 PM

## 2017-03-27 NOTE — H&P (Signed)
[facility] History and Physical  Pacen Watford STM:[id number] DOB: [birth year] DOA: 03/27/2017  Referring physician:  PCP: [name], MD   Chief Complaint: Altered mental status, lethargy 4 days   HPI:  41 year old male with a history of ADD, anxiety, asthma, depression, schizoaffective disorder from a group home who presented to the ED on 11/16 for evaluation of urinary frequency and being off balance. Because of his underlying psychiatric issues, he is unable to have any extensive conversations. The history is provided by the patient's group home director who is by the bedside. Patient was noted to have increased urinary frequency and ruled out for an acute stroke. Clinically on exam he did not have any focal neurologic deficits. His Depakote and lithium levels were not acutely elevated. UA was negative. CT head did not show any acute abnormality..Patient was deemed stable for discharge. At that time patient's blood pressure was 97/75, he appeared nontoxic Today the patient was brought in  because of altered mental status and lethargy. On arrival his core temperature was 58F. Warm fluids were initiated. Chest x-ray revealed bibasilar infiltrates suggestive of pneumonia. Blood glucose was found to be 60. Patient initiated on warming blankets. Blood pressure was in the ED systolic after receiving 8-9/2 L of normal saline boluses. Aspiration pneumonia was considered patient is admitted to step down for sepsis/pneumonia      Review of Systems: negative for the following  Constitutional: Positive for hypothermia, chills, diaphoresis, appetite change and fatigue.  HEENT: Denies photophobia, eye pain, redness, hearing loss, ear pain, congestion, sore throat, rhinorrhea, sneezing, mouth sores, trouble swallowing, neck pain, neck stiffness and tinnitus.  Respiratory: Denies SOB, DOE, cough, chest tightness, and wheezing.  Cardiovascular: Denies chest pain, palpitations and leg  swelling.  Gastrointestinal: Denies nausea, vomiting, abdominal pain, diarrhea, constipation, blood in stool and abdominal distention.  Genitourinary: Denies dysuria, urgency, frequency, hematuria, flank pain and difficulty urinating.  Musculoskeletal: Denies myalgias, back pain, joint swelling, arthralgias and gait problem.  Skin: Denies pallor, rash and wound.  Neurological: Positive for dizziness, seizures, syncope, weakness, light-headedness, numbness and headaches.  Hematological: Denies adenopathy. Easy bruising, personal or family bleeding history  Psychiatric/Behavioral: Denies suicidal ideation, mood changes, confusion, nervousness, sleep disturbance and agitation       Past Medical History:  Diagnosis Date  . ADD (attention deficit disorder)   . Anxiety   . Asthma   . Depression   . HLD (hyperlipidemia)   . Prostate cancer ([place])   . Schizoaffective disorder ([place])   . Seasonal allergies      Past Surgical History:  Procedure Laterality Date  . ESOPHAGOGASTRODUODENOSCOPY (EGD) WITH PROPOFOL N/A 04/16/2015   Performed by Josefine Class, MD at [facility]  . NO PAST SURGERIES        Social History:  reports that  has never smoked. he has never used smokeless tobacco. He reports that he does not drink alcohol or use drugs.    Allergies  Allergen Reactions  . Sulfa Antibiotics Hives        FAMILY HISTORY  When questioned  Directly-patient reports  No family history of HTN, CVA ,DIABETES, TB, Cancer CAD, Bleeding Disorders, Sickle Cell, diabetes, anemia, asthma,   Prior to Admission medications   Medication Sig Start Date End Date Taking? Authorizing Provider  atorvastatin (LIPITOR) 20 MG tablet Take 1 tablet (20 mg total) by mouth daily at 6 PM. 02/24/15  Yes Pucilowska, Jolanta B, MD  divalproex (DEPAKOTE) 500 MG DR tablet Take 2 tablets (  1,000 mg total) by mouth at bedtime. 02/24/15  Yes Pucilowska, Jolanta B, MD  docusate sodium (COLACE) 100 MG  capsule Take 1 capsule (100 mg total) by mouth 2 (two) times daily. 02/24/15  Yes Pucilowska, Jolanta B, MD  lithium 600 MG capsule Take 1 capsule (600 mg total) by mouth at bedtime. 02/24/15  Yes Pucilowska, Jolanta B, MD  LORazepam (ATIVAN) 2 MG tablet Take 2 mg by mouth 2 (two) times daily.   Yes [provider]  metoprolol succinate (TOPROL-XL) 25 MG 24 hr tablet Take 1 tablet (25 mg total) by mouth daily. 02/24/15  Yes Pucilowska, Jolanta B, MD  montelukast (SINGULAIR) 10 MG tablet Take 1 tablet (10 mg total) by mouth at bedtime. 02/24/15  Yes Pucilowska, Jolanta B, MD  omeprazole (PRILOSEC) 40 MG capsule Take 40 mg by mouth daily.   Yes [provider]  polyethylene glycol (MIRALAX / GLYCOLAX) packet Take 17 g by mouth daily. Patient taking differently: Take 17 g by mouth daily. Mix with 8 oz liquid and drink 02/24/15  Yes Pucilowska, Jolanta B, MD  risperiDONE microspheres (RISPERDAL CONSTA) 50 MG injection Inject 2 mLs (50 mg total) into the muscle every 14 (fourteen) days. Patient taking differently: Inject 50 mg into the muscle every 14 (fourteen) days. Every other Thursday 02/24/15  Yes Pucilowska, Jolanta B, MD  temazepam (RESTORIL) 30 MG capsule Take 1 capsule (30 mg total) by mouth at bedtime. 02/24/15  Yes Pucilowska, Jolanta B, MD  benzonatate (TESSALON) 100 MG capsule Take 1 capsule (100 mg total) by mouth every 8 (eight) hours. Patient not taking: Reported on 03/24/2017 11/09/16   Jeannett Senior, PA-C  guaiFENesin-dextromethorphan (ROBITUSSIN DM) 100-10 MG/5ML syrup Take 5 mLs by mouth every 4 (four) hours as needed for cough. Patient not taking: Reported on 03/24/2017 11/09/16   Jeannett Senior, PA-C     Physical Exam: Vitals:   03/27/17 1430 03/27/17 1500 03/27/17 1530 03/27/17 1600  BP: (!) 85/59 (!) 89/60 (!) 76/63 (!) 81/64  Pulse: (!) 58 60 (!) 59 61  Resp: (!) 36 (!) 29 (!) 25 (!) 27  Temp:   (!) 89.2 F (31.8 C)   TempSrc:      SpO2: 95% 97%  96% 96%  Weight:      Height:            Vitals:   03/27/17 1430 03/27/17 1500 03/27/17 1530 03/27/17 1600  BP: (!) 85/59 (!) 89/60 (!) 76/63 (!) 81/64  Pulse: (!) 58 60 (!) 59 61  Resp: (!) 36 (!) 29 (!) 25 (!) 27  Temp:   (!) 89.2 F (31.8 C)   TempSrc:      SpO2: 95% 97% 96% 96%  Weight:      Height:       Constitutional: Patient under warming blankets, minimally conversive, awake and oriented to self Eyes: PERRL, lids and conjunctivae normal ENMT: Mucous membranes are moist. Posterior pharynx clear of any exudate or lesions.Normal dentition.  Neck: normal, supple, no masses, no thyromegaly Respiratory: clear to auscultation bilaterally, no wheezing, no crackles. Normal respiratory effort. No accessory muscle use.  Cardiovascular: Regular rate and rhythm, no murmurs / rubs / gallops. No extremity edema. 2+ pedal pulses. No carotid bruits.  Abdomen: no tenderness, no masses palpated. No hepatosplenomegaly. Bowel sounds positive.  Musculoskeletal: no clubbing / cyanosis. No joint deformity upper and lower extremities. Good ROM, no contractures. Normal muscle tone.  Skin: no rashes, lesions, ulcers. No induration Neurologic: CN 2-12 grossly intact. Sensation  intact, DTR normal. Strength 5/5 in all 4.  Psychiatric: Normal judgment and insight. Alert . Normal mood.     Labs on Admission: I have personally reviewed following labs and imaging studies  CBC: Recent Labs  Lab 03/24/17 1017 03/27/17 1241  WBC 7.5 4.3  NEUTROABS  --  3.0  HGB 13.6 13.8  HCT 42.2 43.5  MCV 93.4 95.6  PLT 96* 74*    Basic Metabolic Panel: Recent Labs  Lab 03/24/17 1017 03/27/17 1241  NA 145 145  K 4.2 4.1  CL 112* 115*  CO2 29 26  GLUCOSE 98 65  BUN 15 17  CREATININE 0.92 1.21  CALCIUM 10.3 9.4    GFR: Estimated Creatinine Clearance: 75.7 mL/min (by C-G formula based on SCr of 1.21 mg/dL).  Liver Function Tests: Recent Labs  Lab 03/27/17 1241  AST 20  ALT 15*  ALKPHOS 21*   BILITOT 0.5  PROT 6.0*  ALBUMIN 2.9*   No results for input(s): LIPASE, AMYLASE in the last 168 hours. No results for input(s): AMMONIA in the last 168 hours.  Coagulation Profile: No results for input(s): INR, PROTIME in the last 168 hours. No results for input(s): DDIMER in the last 72 hours.  Cardiac Enzymes: No results for input(s): CKTOTAL, CKMB, CKMBINDEX, TROPONINI in the last 168 hours.  BNP (last 3 results) No results for input(s): PROBNP in the last 8760 hours.  HbA1C: No results for input(s): HGBA1C in the last 72 hours. No results found for: HGBA1C   CBG: No results for input(s): GLUCAP in the last 168 hours.  Lipid Profile: No results for input(s): CHOL, HDL, LDLCALC, TRIG, CHOLHDL, LDLDIRECT in the last 72 hours.  Thyroid Function Tests: No results for input(s): TSH, T4TOTAL, FREET4, T3FREE, THYROIDAB in the last 72 hours.  Anemia Panel: No results for input(s): VITAMINB12, FOLATE, FERRITIN, TIBC, IRON, RETICCTPCT in the last 72 hours.  Urine analysis:    Component Value Date/Time   COLORURINE STRAW (A) 03/24/2017 0953   APPEARANCEUR CLEAR 03/24/2017 0953   APPEARANCEUR Clear 02/04/2015 0940   LABSPEC 1.004 (L) 03/24/2017 0953   PHURINE 7.0 03/24/2017 0953   GLUCOSEU NEGATIVE 03/24/2017 0953   HGBUR NEGATIVE 03/24/2017 0953   BILIRUBINUR NEGATIVE 03/24/2017 0953   BILIRUBINUR Negative 02/04/2015 0940   KETONESUR NEGATIVE 03/24/2017 0953   PROTEINUR NEGATIVE 03/24/2017 0953   UROBILINOGEN 1.0 02/04/2015 1631   NITRITE NEGATIVE 03/24/2017 0953   LEUKOCYTESUR NEGATIVE 03/24/2017 0953   LEUKOCYTESUR Negative 02/04/2015 0940    Sepsis Labs: _0 (procalcitonin:4,lacticidven:4) )No results found for this or any previous visit (from the past 240 hour(s)).       Radiological Exams on Admission: Dg Chest 2 View  Result Date: 03/27/2017 CLINICAL DATA:  Possible sepsis. EXAM: CHEST  2 VIEW COMPARISON:  11/09/2016. FINDINGS: Trachea is  midline. Heart size normal. There is patchy airspace consolidation at the lung bases. No pleural fluid. IMPRESSION: Patchy bibasilar airspace opacification is worrisome for pneumonia or aspiration. Electronically Signed   By: Lorin Picket M.D.   On: 03/27/2017 15:14   Dg Chest 2 View  Result Date: 03/27/2017 CLINICAL DATA:  Possible sepsis. EXAM: CHEST  2 VIEW COMPARISON:  11/09/2016. FINDINGS: Trachea is midline. Heart size normal. There is patchy airspace consolidation at the lung bases. No pleural fluid. IMPRESSION: Patchy bibasilar airspace opacification is worrisome for pneumonia or aspiration. Electronically Signed   By: Lorin Picket M.D.   On: 03/27/2017 15:14   Ct Head Wo Contrast  Result Date: 03/24/2017 CLINICAL  DATA:  Urinary frequency.  Ataxia. EXAM: CT HEAD WITHOUT CONTRAST TECHNIQUE: Contiguous axial images were obtained from the base of the skull through the vertex without intravenous contrast. COMPARISON:  02/04/2015 FINDINGS: Brain: No evidence of acute infarction, hemorrhage, hydrocephalus, extra-axial collection or mass lesion/mass effect. Generalized cerebral atrophy advanced for age. Mild periventricular white matter low attenuation as can be seen with microvascular disease. Vascular: No hyperdense vessel or unexpected calcification. Skull: No osseous abnormality. Sinuses/Orbits: Visualized paranasal sinuses are clear. Visualized mastoid sinuses are clear. Visualized orbits demonstrate no focal abnormality. Other: None IMPRESSION: 1. No acute intracranial pathology. Electronically Signed   By: Kathreen Devoid   On: 03/24/2017 10:52      EKG: Independently reviewed.   Assessment/Plan Principal Problem:   Sepsis secondary to aspiration pneumonia Patient met sepsis criteria on admission Chest x-ray consistent with bibasal opacities worrisome for pneumonia Patient found to be hypoglycemic, hypothermic, hypotensive and tachycardic Lactic acid 1.04 Will check  pro-calcitonin Blood culture 2 Urine culture 2 Started on broad-spectrum antibiotics namely vancomycin and Zosyn Given hypothermia hypoglycemia, adrenal insufficiency was considered Will check cortisol, TSH Will start patient on stress dose steroids SLP eval, nothing by mouth pending improvement in his mental status  Acute encephalopathy likely secondary to underlying infectious process Recent CT was negative Will need PT OT evaluation prior to discharge No history of drug use of polysubstance abuse Will check UDS      Schizoaffective disorder, depressive type (HCC) Continue lithium, valproic acid, recent levels were within normal limits     DVT prophylaxis:  Heparin     Code Status Orders full code   (From admission, onward)      consults called: none   Family Communication: Admission, patients condition and plan of care including tests being ordered have been discussed with the patient  who indicates understanding and agree with the plan and Code Status  Admission status: inpatient    Disposition plan: Further plan will depend as patient's clinical course evolves and further radiologic and laboratory data become available. Likely home when stable   At the time of admission, it appears that the appropriate admission status for this patient is INPATIENT .Thisis judged to be reasonable and necessary in order to provide the required intensity of service to ensure the patient's safetygiven thepresenting symptoms, physical exam findings, and initial radiographic and laboratory data in the context of their chronic comorbidities.   Reyne Dumas MD [facility] Pager [phone]  If 7PM-7AM, please contact night-coverage [email or web address] Password [email or web address]  03/27/2017, 4:30 PM

## 2017-03-27 NOTE — ED Notes (Signed)
Spoke with hospitalist regarding pt's low BP and decreased mental status. Per MD, 1L fluid bolus initiated. Will continue to monitor and call back if pt does not improve. Bair hugger remains on pt.

## 2017-03-27 NOTE — ED Provider Notes (Signed)
[facility] EMERGENCY DEPARTMENT Provider Note   CSN: [id number] Arrival date & time: 03/27/17  1242     History   Chief Complaint Chief Complaint  Patient presents with  . Altered Mental Status    HPI Chang Tiggs is a 41 y.o. male.  HPI Patient is a 41 year old male with developmental delay who presents to the emergency department from back care home after a possible aspiration event today.  He was eating breakfast and began coughing after eating an egg.  Staff members were concerned about the possibility of aspiration and thus he was taken to an urgent care where he was found to have a core temperature of 88 degrees.  It is reported that he has had some confusion and decreased energy over the past several days.  He was seen in the emergency department Sept 16 2018 and underwent labs and a head CT which demonstrated no significant abnormality.  Rare bacteria noted on UA during Sept 16 visit. No urine culture obtained.  Lithium and valproic levels at that time were within normal limits.   Past Medical History:  Diagnosis Date  . ADD (attention deficit disorder)   . Anxiety   . Asthma   . Depression   . HLD (hyperlipidemia)   . Prostate cancer ([place])   . Schizoaffective disorder ([place])   . Seasonal allergies     Patient Active Problem List   Diagnosis Date Noted  . SBO (small bowel obstruction) ([place]) 09/24/2015  . Hyperthyroidism 02/24/2015  . Failure to thrive in adult 02/19/2015  . Schizoaffective disorder, depressive type ([place]) 02/18/2015  . Elevated lithium level 02/18/2015  . Constipation 02/18/2015  . Absence of bladder continence 02/06/2015  . 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 ESOPHAGOGASTRODUODENOSCOPY (EGD) WITH PROPOFOL N/A 04/16/2015   Performed by Josefine Class, MD at [facility]  . NO PAST SURGERIES         Home Medications    Prior to Admission medications     Medication Sig Start Date End Date Taking? Authorizing Provider  atorvastatin (LIPITOR) 20 MG tablet Take 1 tablet (20 mg total) by mouth daily at 6 PM. 02/24/15  Yes Pucilowska, Jolanta B, MD  divalproex (DEPAKOTE) 500 MG DR tablet Take 2 tablets (1,000 mg total) by mouth at bedtime. 02/24/15  Yes Pucilowska, Jolanta B, MD  docusate sodium (COLACE) 100 MG capsule Take 1 capsule (100 mg total) by mouth 2 (two) times daily. 02/24/15  Yes Pucilowska, Jolanta B, MD  lithium 600 MG capsule Take 1 capsule (600 mg total) by mouth at bedtime. 02/24/15  Yes Pucilowska, Jolanta B, MD  LORazepam (ATIVAN) 2 MG tablet Take 2 mg by mouth 2 (two) times daily.   Yes [provider]  metoprolol succinate (TOPROL-XL) 25 MG 24 hr tablet Take 1 tablet (25 mg total) by mouth daily. 02/24/15  Yes Pucilowska, Jolanta B, MD  montelukast (SINGULAIR) 10 MG tablet Take 1 tablet (10 mg total) by mouth at bedtime. 02/24/15  Yes Pucilowska, Jolanta B, MD  omeprazole (PRILOSEC) 40 MG capsule Take 40 mg by mouth daily.   Yes [provider]  polyethylene glycol (MIRALAX / GLYCOLAX) packet Take 17 g by mouth daily. Patient taking differently: Take 17 g by mouth daily. Mix with 8 oz liquid and drink 02/24/15  Yes Pucilowska, Jolanta B, MD  risperiDONE microspheres (RISPERDAL CONSTA) 50 MG injection Inject 2 mLs (50 mg total) into the muscle every 14 (  fourteen) days. Patient taking differently: Inject 50 mg into the muscle every 14 (fourteen) days. Every other Thursday 02/24/15  Yes Pucilowska, Jolanta B, MD  temazepam (RESTORIL) 30 MG capsule Take 1 capsule (30 mg total) by mouth at bedtime. 02/24/15  Yes Pucilowska, Jolanta B, MD  benzonatate (TESSALON) 100 MG capsule Take 1 capsule (100 mg total) by mouth every 8 (eight) hours. Patient not taking: Reported on 03/24/2017 11/09/16   Jeannett Senior, PA-C  guaiFENesin-dextromethorphan (ROBITUSSIN DM) 100-10 MG/5ML syrup Take 5 mLs by mouth every 4 (four) hours  as needed for cough. Patient not taking: Reported on 03/24/2017 11/09/16   Jeannett Senior, PA-C    Family History History reviewed. No pertinent family history.  Social History Social History   Tobacco Use  . Smoking status: Never Smoker  . Smokeless tobacco: Never Used  Substance Use Topics  . Alcohol use: No    Alcohol/week: 0.0 oz  . Drug use: No     Allergies   Sulfa antibiotics   Review of Systems Review of Systems   Physical Exam Updated Vital Signs BP (!) 85/59   Pulse (!) 58   Temp (!) 88 F (31.1 C) (Oral)   Resp (!) 36   Ht 5\' 9"  (1.753 m)   Wt 68 kg (150 lb)   SpO2 95%   BMI 22.15 kg/m   Physical Exam   ED Treatments / Results  Labs (all labs ordered are listed, but only abnormal results are displayed) Labs Reviewed  COMPREHENSIVE METABOLIC PANEL - Abnormal; Notable for the following components:      Result Value   Chloride 115 (*)    Total Protein 6.0 (*)    Albumin 2.9 (*)    ALT 15 (*)    Alkaline Phosphatase 21 (*)    Anion gap 4 (*)    All other components within normal limits  CBC WITH DIFFERENTIAL/PLATELET - Abnormal; Notable for the following components:   RDW 16.4 (*)    Platelets 74 (*)    Lymphs Abs 0.6 (*)    All other components within normal limits  CULTURE, BLOOD (ROUTINE X 2)  CULTURE, BLOOD (ROUTINE X 2)  URINE CULTURE  URINALYSIS, ROUTINE W REFLEX MICROSCOPIC  I-STAT CG4 LACTIC ACID, ED  I-STAT CG4 LACTIC ACID, ED    EKG  EKG Interpretation None       Radiology Dg Chest 2 View  Result Date: 03/27/2017 CLINICAL DATA:  Possible sepsis. EXAM: CHEST  2 VIEW COMPARISON:  11/09/2016. FINDINGS: Trachea is midline. Heart size normal. There is patchy airspace consolidation at the lung bases. No pleural fluid. IMPRESSION: Patchy bibasilar airspace opacification is worrisome for pneumonia or aspiration. Electronically Signed   By: Lorin Picket M.D.   On: 03/27/2017 15:14    Procedures .Critical  Care Performed by: Jola Schmidt, MD Authorized by: Jola Schmidt, MD     CRITICAL CARE Performed by: Jola Schmidt Total critical care time: 35 minutes Critical care time was exclusive of separately billable procedures and treating other patients. Critical care was necessary to treat or prevent imminent or life-threatening deterioration. Critical care was time spent personally by me on the following activities: development of treatment plan with patient and/or surrogate as well as nursing, discussions with consultants, evaluation of patient's response to treatment, examination of patient, obtaining history from patient or surrogate, ordering and performing treatments and interventions, ordering and review of laboratory studies, ordering and review of radiographic studies, pulse oximetry and re-evaluation of patient's condition.  Medications Ordered in ED Medications  sodium chloride 0.9 % bolus 1,000 mL (not administered)  vancomycin (VANCOCIN) IVPB 1000 mg/200 mL premix (not administered)  piperacillin-tazobactam (ZOSYN) IVPB 3.375 g (not administered)  sodium chloride 0.9 % bolus 2,040 mL (2,040 mLs Intravenous New Bag/Given 03/27/17 1310)     Initial Impression / Assessment and Plan / ED Course  I have reviewed the triage vital signs and the nursing notes.  Pertinent labs & imaging results that were available during my care of the patient were reviewed by me and considered in my medical decision making (see chart for details).     Possible bibasilar pneumonia.  Hypothermic on arrival.  Hypoglycemic.  Patient be started on D5 at this time.  Warming measures in place.  Broad-spectrum antibiotics now.  Blood cultures obtained.  30 cc/kg fluid bolus given.  Blood pressure improved to 85/59.  He will receive another 1 L now.  Lactate is reassuring.  Will recheck lactate.  Final Clinical Impressions(s) / ED Diagnoses   Final diagnoses:  Aspiration pneumonitis ([place])  Hypothermia,  initial encounter  Hypotension, unspecified hypotension type    ED Discharge Orders    None       Jola Schmidt, MD 03/27/17 1600

## 2017-03-28 ENCOUNTER — Inpatient Hospital Stay (HOSPITAL_COMMUNITY): Payer: Medicare Other

## 2017-03-28 LAB — CBC
HEMATOCRIT: 37 % — AB (ref 39.0–52.0)
HEMOGLOBIN: 11.5 g/dL — AB (ref 13.0–17.0)
MCH: 29.3 pg (ref 26.0–34.0)
MCHC: 31.1 g/dL (ref 30.0–36.0)
MCV: 94.1 fL (ref 78.0–100.0)
Platelets: 73 10*3/uL — ABNORMAL LOW (ref 150–400)
RBC: 3.93 MIL/uL — ABNORMAL LOW (ref 4.22–5.81)
RDW: 16.8 % — ABNORMAL HIGH (ref 11.5–15.5)
WBC: 7.4 10*3/uL (ref 4.0–10.5)

## 2017-03-28 LAB — GLUCOSE, CAPILLARY
GLUCOSE-CAPILLARY: 113 mg/dL — AB (ref 65–99)
GLUCOSE-CAPILLARY: 174 mg/dL — AB (ref 65–99)
GLUCOSE-CAPILLARY: 72 mg/dL (ref 65–99)
GLUCOSE-CAPILLARY: 81 mg/dL (ref 65–99)
Glucose-Capillary: 80 mg/dL (ref 65–99)

## 2017-03-28 LAB — TROPONIN I: Troponin I: <0.03 ng/mL (ref <0.03)

## 2017-03-28 LAB — COMPREHENSIVE METABOLIC PANEL
ALBUMIN: 2.3 g/dL — AB (ref 3.5–5.0)
ALK PHOS: 17 U/L — AB (ref 38–126)
ALT: 11 U/L — AB (ref 17–63)
AST: 15 U/L (ref 15–41)
Anion gap: 6 (ref 5–15)
BILIRUBIN TOTAL: 0.4 mg/dL (ref 0.3–1.2)
BUN: 14 mg/dL (ref 6–20)
CALCIUM: 8 mg/dL — AB (ref 8.9–10.3)
CO2: 20 mmol/L — ABNORMAL LOW (ref 22–32)
CREATININE: 1.27 mg/dL — AB (ref 0.61–1.24)
Chloride: 121 mmol/L — ABNORMAL HIGH (ref 101–111)
GFR calc Af Amer: >60 mL/min (ref >60)
GFR calc non Af Amer: >60 mL/min (ref >60)
GLUCOSE: 73 mg/dL (ref 65–99)
Potassium: 4.1 mmol/L (ref 3.5–5.1)
Sodium: 147 mmol/L — ABNORMAL HIGH (ref 135–145)
TOTAL PROTEIN: 5.1 g/dL — AB (ref 6.5–8.1)

## 2017-03-28 LAB — TSH: TSH: 7.792 u[IU]/mL — ABNORMAL HIGH (ref 0.350–4.500)

## 2017-03-28 LAB — MRSA PCR SCREENING: MRSA BY PCR: NEGATIVE

## 2017-03-28 LAB — CBG MONITORING, ED: GLUCOSE-CAPILLARY: 99 mg/dL (ref 65–99)

## 2017-03-28 LAB — CORTISOL: Cortisol, Plasma: 20.5 ug/dL

## 2017-03-28 LAB — HIV ANTIBODY (ROUTINE TESTING W REFLEX): HIV Screen 4th Generation wRfx: NONREACTIVE

## 2017-03-28 LAB — T4, FREE: FREE T4: 0.73 ng/dL (ref 0.61–1.12)

## 2017-03-28 LAB — LITHIUM LEVEL: LITHIUM LVL: 1.08 mmol/L (ref 0.60–1.20)

## 2017-03-28 NOTE — Progress Notes (Signed)
Notified by CCMD that patients rhythm changed from NSR to A-fib at approximately 1442. 12 lead ECG obtained and primary MD notified. Vital signs stable. Will continue to monitor.

## 2017-03-28 NOTE — Progress Notes (Signed)
Spoke with Group home director--[facility]--He has been with them for 10 yrs. Usually at baseline is able to go to day program and is walking and has some slurred speech and his "words run together" Was coughing when eating on day of admission--hypothermic and lethargic Estimated Iq is low  Verneita Griffes, MD [facility] [phone]

## 2017-03-28 NOTE — ED Notes (Addendum)
Paged admitting to let him now of pressures and MD said to watch he was not wanting to place him on pressures at the moment. He was alert and much better than when he arrived.

## 2017-03-28 NOTE — Evaluation (Signed)
Clinical/Bedside Swallow Evaluation Patient Details  Name: Shane Gentry MRN: [id number] Date of Birth: 07-Feb-1976  Today's Date: 03/28/2017 Time: SLP Start Time (ACUTE ONLY): 8416 SLP Stop Time (ACUTE ONLY): 0842 SLP Time Calculation (min) (ACUTE ONLY): 9 min  Past Medical History:  Past Medical History:  Diagnosis Date  . ADD (attention deficit disorder)   . Anxiety   . Asthma   . Depression   . HLD (hyperlipidemia)   . Prostate cancer ([place])   . Schizoaffective disorder ([place])   . Seasonal allergies    Past Surgical History:  Past Surgical History:  Procedure Laterality Date  . ESOPHAGOGASTRODUODENOSCOPY (EGD) WITH PROPOFOL N/A 04/16/2015   Performed by Josefine Class, MD at [facility]  . NO PAST SURGERIES     HPI:  41 year old male living in a group home with a history of ADD, anxiety, asthma, depression, schizoaffective disorder from a group home who presented to the ED with AMS and lethargy. ED visit on 11/16 for evaluation of urinary frequency and being off balance.CT no acute abnormality and pt discharged. Chest x-ray revealed patchy bibasilar airspace opacification is worrisome for pneumonia.    Assessment / Plan / Recommendation Clinical Impression  Pt mildly lethargic; generalized oral-motor and bilateral hand weakness. Suspected pharyngeal dysphagia indicated by consistent and immediate cough following thin water. CXR revealed airspace opicification worrisome for pna therefore MBS scheduled today at 1330. Recommend NPO until MBS except for ice chips if pt requests (following oral care).  SLP Visit Diagnosis: Dysphagia, unspecified (R13.10)    Aspiration Risk  Moderate aspiration risk    Diet Recommendation NPO(ice chips allowed)        Other  Recommendations Oral Care Recommendations: Oral care QID   Follow up Recommendations 24 hour supervision/assistance      Frequency and Duration            Prognosis        Swallow Study   General  HPI: 41 year old male living in a group home with a history of ADD, anxiety, asthma, depression, schizoaffective disorder from a group home who presented to the ED with AMS and lethargy. ED visit on 11/16 for evaluation of urinary frequency and being off balance.CT no acute abnormality and pt discharged. Chest x-ray revealed patchy bibasilar airspace opacification is worrisome for pneumonia.  Type of Study: Bedside Swallow Evaluation Previous Swallow Assessment: (none found) Diet Prior to this Study: Regular;Thin liquids Temperature Spikes Noted: No Respiratory Status: Room air History of Recent Intubation: No Behavior/Cognition: Lethargic/Drowsy;Cooperative;Pleasant mood;Requires cueing Oral Cavity Assessment: Within Functional Limits Oral Care Completed by SLP: No Oral Cavity - Dentition: Adequate natural dentition Vision: Functional for self-feeding Self-Feeding Abilities: Able to feed self;Needs set up;Needs assist Patient Positioning: Upright in bed Baseline Vocal Quality: Normal Volitional Cough: Cognitively unable to elicit    Oral/Motor/Sensory Function Overall Oral Motor/Sensory Function: Generalized oral weakness   Ice Chips Ice chips: Not tested   Thin Liquid Thin Liquid: Impaired Presentation: Cup Pharyngeal  Phase Impairments: Cough - Immediate    Nectar Thick Nectar Thick Liquid: Not tested   Honey Thick Honey Thick Liquid: Not tested   Puree Puree: Within functional limits   Solid   GO   Solid: Not tested        [name] 03/28/2017,8:54 AM  [name] [employer] [phone]

## 2017-03-28 NOTE — Progress Notes (Signed)
Modified Barium Swallow Progress Note  Patient Details  Name: Shane Gentry MRN: [id number] Date of Birth: 09/28/1975  Today's Date: 03/28/2017  Modified Barium Swallow completed.  Full report located under Chart Review in the Imaging Section.  Brief recommendations include the following:  Clinical Impression  Pt demonstrated moderate oral dysphagia marked by decreased labial seal and closure (during mastication) resulting in prolonged mastication, manipulation and delayed transit and labial residue. Pharyngeal phase was overall functional with min-mild vallecular and pyriform sinus residue. Trace amount of barium on anterior wall of laryngeal vestibule noted at end of study. Recommend Dys 2 texture, thin liquids, straws allowed, check oral cavity for potential pocketing and meds whole in puree.     Swallow Evaluation Recommendations       SLP Diet Recommendations: Dysphagia 2 (Fine chop) solids;Thin liquid   Liquid Administration via: Straw;Cup   Medication Administration: Crushed with puree   Supervision: Full supervision/cueing for compensatory strategies;Staff to assist with self feeding   Compensations: Slow rate;Small sips/bites;Lingual sweep for clearance of pocketing;Minimize environmental distractions   Postural Changes: Seated upright at 90 degrees   Oral Care Recommendations: Oral care BID        [name] 03/28/2017,4:57 PM   [name] [place].Ed [employer] [phone]

## 2017-03-28 NOTE — Progress Notes (Signed)
PROGRESS NOTE    Shane Gentry  EHU:[id number] DOB: 10-Jun-1975 DOA: 03/27/2017 PCP: [name], MD   Specialists:     Brief Narrative:  41 year old male, previously from a group home Prior SBO 09/25/2015 hospitalized for the same Failure to thrive Hypothyroidism Schizoaffective disorder + MR Prior dysphagia and weight loss probably secondary to severe chronic constipation  Presented from group home with a one-week history of altered mental status Had actually come to the ED 1116 if being off balance On arrival found to have a core temperature of 88 patient was given warm fluids then chest x-ray revealed bilateral infiltrates suggestive of pneumonia glucose was 60 patient received 2-1/2 L normal saline for presumed aspiration pneumonia Labs on admission showed sodium 147 BUN/creatinine 14/1.2 LFTs normal Platelets 73     Assessment & Plan:   Principal Problem:   Aspiration pneumonitis (HCC) Active Problems:   Schizoaffective disorder, depressive type ([place])   Failure to thrive in adult   Hyperthyroidism   Sepsis secondary to UTI ([place])   Sepsis ([place])   Probable aspiration pneumonia ?  Secondary to his dysphagia and psychiatric condition Continue IV vancomycin, Zosyn and de-escalate rapidly-continue normal saline with D5 75 cc/H Calcitonin was 1.2 follow creatinine Keep in stepdown unit today SLP input appreciated-going for MBs later today re: diet feasibility Continue Solu-Cortef 100 mg every 8, Tylenol for fever  Hypothyroidism TSH slightly elevated at 7.7-this is a reported illness but I do not see any meds for the same will monitor and adjust if needed  Affective disorder, MR reported Resume Risperdal Consta 50 Q 14, temazepam 30 at bedtime, Depakote 1000 at bedtime, lithium 1 capsule at bedtime, lorazepam 2 mg twice daily  HTN Continue metoprolol XL 25 daily  Hyperlipidemia continue atorvastatin 20 every afternoon  Chronic severe constipation  continue MiraLAX 17 g daily ensure proper bowel regimen   TCP unclear cause Could be meds-monitor labs in am  No family presumed full code SDU inpatient  Consultants:   None  Procedures:   None  Antimicrobials:   Vancomycin, Zosyn 11/19   Subjective:  rousable but unintelligble and promptly falls back to sleep  Objective: Vitals:   03/28/17 0646 03/28/17 0647 03/28/17 0648 03/28/17 0700  BP: (!) 89/55 (!) 86/54  (!) 81/59  Pulse:   86 82  Resp:   (!) 40 (!) 28  Temp: 97.6 F (36.4 C)     TempSrc: Axillary     SpO2:   94% 95%  Weight:      Height:        Intake/Output Summary (Last 24 hours) at 03/28/2017 0733 Last data filed at 03/28/2017 0646 Gross per 24 hour  Intake 1500 ml  Output 1250 ml  Net 250 ml   Filed Weights   03/27/17 1222  Weight: 68 kg (150 lb)    Examination:  rousable but not really doing much Interacted more with SLP per RN cta b, No added sound abd soft nt nd no rebound no guard Poor dentition  Data Reviewed: I have personally reviewed following labs and imaging studies  CBC: Recent Labs  Lab 03/24/17 1017 03/27/17 1241 03/28/17 0320  WBC 7.5 4.3 7.4  NEUTROABS  --  3.0  --   HGB 13.6 13.8 11.5*  HCT 42.2 43.5 37.0*  MCV 93.4 95.6 94.1  PLT 96* 74* 73*   Basic Metabolic Panel: Recent Labs  Lab 03/24/17 1017 03/27/17 1241 03/27/17 1622 03/28/17 0320  NA 145 145  --  147*  K 4.2 4.1  --  4.1  CL 112* 115*  --  121*  CO2 29 26  --  20*  GLUCOSE 98 65  --  73  BUN 15 17  --  14  CREATININE 0.92 1.21 1.05 1.27*  CALCIUM 10.3 9.4  --  8.0*  MG  --   --  2.1  --    GFR: Estimated Creatinine Clearance: 72.1 mL/min (A) (by C-G formula based on SCr of 1.27 mg/dL (H)). Liver Function Tests: Recent Labs  Lab 03/27/17 1241 03/28/17 0320  AST 20 15  ALT 15* 11*  ALKPHOS 21* 17*  BILITOT 0.5 0.4  PROT 6.0* 5.1*  ALBUMIN 2.9* 2.3*   No results for input(s): LIPASE, AMYLASE in the last 168 hours. No results  for input(s): AMMONIA in the last 168 hours. Coagulation Profile: Recent Labs  Lab 03/27/17 1622  INR 1.16   Cardiac Enzymes: Recent Labs  Lab 03/27/17 1622 03/27/17 2118 03/28/17 0320  TROPONINI <0.03 <0.03 <0.03   BNP (last 3 results) No results for input(s): PROBNP in the last 8760 hours. HbA1C: No results for input(s): HGBA1C in the last 72 hours. CBG: Recent Labs  Lab 03/28/17 0603  GLUCAP 99   Lipid Profile: No results for input(s): CHOL, HDL, LDLCALC, TRIG, CHOLHDL, LDLDIRECT in the last 72 hours. Thyroid Function Tests: Recent Labs    03/27/17 1622  TSH 7.792*  FREET4 0.73   Anemia Panel: No results for input(s): VITAMINB12, FOLATE, FERRITIN, TIBC, IRON, RETICCTPCT in the last 72 hours. Urine analysis:    Component Value Date/Time   COLORURINE YELLOW 03/27/2017 2226   APPEARANCEUR CLEAR 03/27/2017 2226   APPEARANCEUR Clear 02/04/2015 0940   LABSPEC 1.011 03/27/2017 2226   PHURINE 5.0 03/27/2017 2226   GLUCOSEU NEGATIVE 03/27/2017 2226   HGBUR NEGATIVE 03/27/2017 2226   BILIRUBINUR NEGATIVE 03/27/2017 2226   BILIRUBINUR Negative 02/04/2015 0940   KETONESUR NEGATIVE 03/27/2017 2226   PROTEINUR NEGATIVE 03/27/2017 2226   UROBILINOGEN 1.0 02/04/2015 1631   NITRITE NEGATIVE 03/27/2017 2226   LEUKOCYTESUR NEGATIVE 03/27/2017 2226   LEUKOCYTESUR Negative 02/04/2015 0940     Radiology Studies: Reviewed images personally in health database    Scheduled Meds: . atorvastatin  20 mg Oral q1800  . divalproex  1,000 mg Oral QHS  . heparin  5,000 Units Subcutaneous Q8H  . hydrocortisone sod succinate (SOLU-CORTEF) inj  100 mg Intravenous Q8H  . lithium  600 mg Oral QHS  . LORazepam  2 mg Oral BID  . montelukast  10 mg Oral QHS  . pantoprazole  40 mg Oral Daily  . temazepam  30 mg Oral QHS   Continuous Infusions: . dextrose 5 % and 0.9% NaCl 75 mL/hr at 03/28/17 0603  . piperacillin-tazobactam (ZOSYN)  IV 3.375 g (03/27/17 2143)  . vancomycin Stopped  (03/28/17 0118)     LOS: 1 day    Time spent: [place], MD [facility]   If 7PM-7AM, please contact night-coverage [email or web address] Password [facility] 03/28/2017, 7:33 AM

## 2017-03-29 LAB — GLUCOSE, CAPILLARY
GLUCOSE-CAPILLARY: 91 mg/dL (ref 65–99)
GLUCOSE-CAPILLARY: 104 mg/dL — AB (ref 65–99)
Glucose-Capillary: 109 mg/dL — ABNORMAL HIGH (ref 65–99)
Glucose-Capillary: 158 mg/dL — ABNORMAL HIGH (ref 65–99)
Glucose-Capillary: 53 mg/dL — ABNORMAL LOW (ref 65–99)
Glucose-Capillary: 99 mg/dL (ref 65–99)

## 2017-03-29 LAB — CBC WITH DIFFERENTIAL/PLATELET
BASOS ABS: 0 10*3/uL (ref 0.0–0.1)
Basophils Relative: 0 %
Eosinophils Absolute: 0 10*3/uL (ref 0.0–0.7)
Eosinophils Relative: 0 %
HCT: 37.6 % — ABNORMAL LOW (ref 39.0–52.0)
Hemoglobin: 11.8 g/dL — ABNORMAL LOW (ref 13.0–17.0)
LYMPHS ABS: 1.3 10*3/uL (ref 0.7–4.0)
Lymphocytes Relative: 12 %
MCH: 29.4 pg (ref 26.0–34.0)
MCHC: 31.4 g/dL (ref 30.0–36.0)
MCV: 93.8 fL (ref 78.0–100.0)
MONO ABS: 1.2 10*3/uL — AB (ref 0.1–1.0)
Monocytes Relative: 11 %
NEUTROS ABS: 8.6 10*3/uL — AB (ref 1.7–7.7)
Neutrophils Relative %: 77 %
PLATELETS: 77 10*3/uL — AB (ref 150–400)
RBC: 4.01 MIL/uL — AB (ref 4.22–5.81)
RDW: 17.2 % — AB (ref 11.5–15.5)
WBC: 11.1 10*3/uL — AB (ref 4.0–10.5)

## 2017-03-29 LAB — RENAL FUNCTION PANEL
ALBUMIN: 2.3 g/dL — AB (ref 3.5–5.0)
Anion gap: 5 (ref 5–15)
BUN: 14 mg/dL (ref 6–20)
CO2: 20 mmol/L — ABNORMAL LOW (ref 22–32)
CREATININE: 1.31 mg/dL — AB (ref 0.61–1.24)
Calcium: 9 mg/dL (ref 8.9–10.3)
Chloride: 120 mmol/L — ABNORMAL HIGH (ref 101–111)
GFR calc Af Amer: >60 mL/min (ref >60)
Glucose, Bld: 112 mg/dL — ABNORMAL HIGH (ref 65–99)
PHOSPHORUS: 2.5 mg/dL (ref 2.5–4.6)
Potassium: 3.7 mmol/L (ref 3.5–5.1)
SODIUM: 145 mmol/L (ref 135–145)

## 2017-03-29 LAB — URINE CULTURE: Culture: NO GROWTH

## 2017-03-29 LAB — VANCOMYCIN, TROUGH: Vancomycin Tr: 18 ug/mL (ref 15–20)

## 2017-03-29 MED ORDER — BENZONATATE 100 MG PO CAPS
100.0000 mg | ORAL_CAPSULE | Freq: Three times a day (TID) | ORAL | Status: DC
  Administered 2017-03-29 – 2017-04-13 (×42): 100 mg via ORAL
  Filled 2017-03-29 (×41): qty 1

## 2017-03-29 MED ORDER — DEXTROSE-NACL 5-0.45 % IV SOLN
INTRAVENOUS | Status: DC
  Administered 2017-03-29: 09:00:00 via INTRAVENOUS

## 2017-03-29 MED ORDER — HYDROCORTISONE NA SUCCINATE PF 100 MG IJ SOLR
50.0000 mg | Freq: Two times a day (BID) | INTRAMUSCULAR | Status: DC
  Administered 2017-03-29 – 2017-03-30 (×2): 50 mg via INTRAVENOUS
  Filled 2017-03-29 (×2): qty 2

## 2017-03-29 NOTE — Progress Notes (Signed)
PROGRESS NOTE    Numan Zylstra  NFA:[id number] DOB: 1975/11/27 DOA: 03/27/2017 PCP: [name], MD   Specialists:     Brief Narrative:  41 year old long-term resident of group home Prior SBO 09/25/2015 hospitalized for the same Failure to thrive Hypothyroidism Schizoaffective disorder + MR Prior dysphagia and weight loss probably secondary to severe chronic constipation  Presented from group home with altered mental status Coughing day of admission On arrival 11/19 to emergency room core temperature of 88 patient was given warm fluids then chest x-ray revealed bilateral infiltrates suggestive of pneumonia glucose was 60 patient received 2-1/2 L normal saline for presumed aspiration pneumonia Labs on admission showed sodium 147 BUN/creatinine 14/1.2 LFTs normal Platelets 73  Assessment & Plan:   Principal Problem:   Aspiration pneumonitis (HCC) Active Problems:   Schizoaffective disorder, depressive type ([place])   Failure to thrive in adult   Hyperthyroidism   Sepsis secondary to UTI ([place])   Sepsis ([place])   Probable aspiration pneumonia ?  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 Resume Risperdal Consta 50 Q 14, temazepam 30 at bedtime, Depakote 1000 at bedtime, lithium 1 capsule at bedtime, lorazepam 2 mg twice daily  HTN Continue metoprolol XL 25 when able as still hypotensive  Tonic kidney disease stage II-III baseline creatinine anywhere from 1.0-1.2  currently 1.31 continue saline as above  Hyperlipidemia continue atorvastatin 20 every afternoon  Chronic severe constipation continue MiraLAX 17 g daily ensure proper bowel regimen  TCP unclear cause Appears to have been seen at [facility] oncology in 2010 but no information currently available at this time Could be meds-monitor labs in am  presumed full code SDU Inpatient   Consultants:   None  Procedures:   None  Antimicrobials:   Vancomycin, Zosyn 11/19   Subjective:  Much more awake alert asking to eat repetitively No distress No chest pain No shortness of breath Coughing a little bit but nursing does not confirm  Objective: Vitals:   03/29/17 0338 03/29/17 0400 03/29/17 0600 03/29/17 0648  BP: 92/61 110/62 (!) 97/59   Pulse: (!) 30 81 81   Resp:      Temp: (!) 96.6 F (35.9 C)   98.1 F (36.7 C)  TempSrc: Axillary   Axillary  SpO2: 94% 95% 94%   Weight:      Height:        Intake/Output Summary (Last 24 hours) at 03/29/2017 4696 Last data filed at 03/29/2017 0400 Gross per 24 hour  Intake 2315 ml  Output 1400 ml  Net 915 ml   Filed Weights   03/27/17 1222  Weight: 68 kg (150 lb)    Examination: EOMI NCAT chest is clinically clear Protruding tongue Hypothyroid-like bases S1-S2 no murmur rub or gallop Abdomen soft nontender nondistended no Smile symmetric Moving all 4 limbs equally no family present today  Data Reviewed: I have personally reviewed following labs and imaging studies  CBC: Recent Labs  Lab 03/24/17 1017 03/27/17 1241 03/28/17 0320 03/29/17 0317  WBC 7.5 4.3 7.4 11.1*  NEUTROABS  --  3.0  --  8.6*  HGB 13.6 13.8 11.5* 11.8*  HCT 42.2 43.5 37.0* 37.6*  MCV 93.4 95.6 94.1 93.8  PLT 96* 74* 73* 77*   Basic Metabolic Panel: Recent Labs  Lab 03/24/17 1017 03/27/17 1241 03/27/17 1622 03/28/17 0320 03/29/17 0317  NA 145 145  --  147* 145  K 4.2 4.1  --  4.1 3.7  CL 112* 115*  --  121* 120*  CO2 29 26  --  20* 20*    GLUCOSE 98 65  --  73 112*  BUN 15 17  --  14 14  CREATININE 0.92 1.21 1.05 1.27* 1.31*  CALCIUM 10.3 9.4  --  8.0* 9.0  MG  --   --  2.1  --   --   PHOS  --   --   --   --  2.5   GFR: Estimated Creatinine Clearance: 69.9 mL/min (A) (by C-G formula based on SCr of 1.31 mg/dL (H)). Liver Function Tests: Recent Labs  Lab 03/27/17 1241 03/28/17 0320 03/29/17 0317  AST 20 15  --   ALT 15* 11*  --   ALKPHOS 21* 17*  --   BILITOT 0.5 0.4  --   PROT 6.0* 5.1*  --   ALBUMIN 2.9* 2.3* 2.3*   No results for input(s): LIPASE, AMYLASE in the last 168 hours. No results for input(s): AMMONIA in the last 168 hours. Coagulation Profile: Recent Labs  Lab 03/27/17 1622  INR 1.16   Cardiac Enzymes: Recent Labs  Lab 03/27/17 1622 03/27/17 2118 03/28/17 0320  TROPONINI <0.03 <0.03 <0.03   BNP (last 3 results) No results for input(s): PROBNP in the last 8760 hours. HbA1C: No results for input(s): HGBA1C in the last 72 hours. CBG: Recent Labs  Lab 03/28/17 1207 03/28/17 1645 03/28/17 1944 03/28/17 2333 03/29/17 0343  GLUCAP 81 174* 72 113* 109*   Lipid Profile: No results for input(s): CHOL, HDL, LDLCALC, TRIG, CHOLHDL, LDLDIRECT in the last 72 hours. Thyroid Function Tests: Recent Labs    03/27/17 1622  TSH 7.792*  FREET4 0.73   Anemia Panel: No results for input(s): VITAMINB12, FOLATE, FERRITIN, TIBC, IRON, RETICCTPCT in the last 72 hours. Urine analysis:    Component Value Date/Time   COLORURINE YELLOW 03/27/2017 2226   APPEARANCEUR CLEAR 03/27/2017 2226   APPEARANCEUR Clear 02/04/2015 0940   LABSPEC 1.011 03/27/2017 2226   PHURINE 5.0 03/27/2017 2226   GLUCOSEU NEGATIVE 03/27/2017 2226   HGBUR NEGATIVE 03/27/2017 2226   BILIRUBINUR NEGATIVE 03/27/2017 2226   BILIRUBINUR Negative 02/04/2015 0940   KETONESUR NEGATIVE 03/27/2017 2226   PROTEINUR NEGATIVE 03/27/2017 2226   UROBILINOGEN 1.0 02/04/2015 1631   NITRITE NEGATIVE 03/27/2017 2226   LEUKOCYTESUR  NEGATIVE 03/27/2017 2226   LEUKOCYTESUR Negative 02/04/2015 0940     Radiology Studies: Reviewed images personally in health database    Scheduled Meds: . atorvastatin  20 mg Oral q1800  . divalproex  1,000 mg Oral QHS  . heparin  5,000 Units Subcutaneous Q8H  . hydrocortisone sod succinate (SOLU-CORTEF) inj  100 mg Intravenous Q8H  . lithium  600 mg Oral QHS  . LORazepam  2 mg Oral BID  . montelukast  10 mg Oral QHS  . pantoprazole  40 mg Oral Daily  . temazepam  30 mg Oral QHS   Continuous Infusions: . dextrose 5 % and 0.9% NaCl 75 mL/hr at 03/28/17 2115  . piperacillin-tazobactam (ZOSYN)  IV 3.375 g (03/29/17 1308)  . vancomycin Stopped (03/28/17  2217)     LOS: 2 days    Time spent: [place], MD [facility]   If 7PM-7AM, please contact night-coverage [email or web address] Password [facility] 03/29/2017, 7:21 AM

## 2017-03-29 NOTE — Clinical Social Work Note (Signed)
Clinical Social Work Assessment  Patient Details  Name: Shane Gentry MRN: [id number] Date of Birth: [birth year]  Date of referral:  03/29/17               Reason for consult:  Discharge Planning                Permission sought to share information with:  Facility Sport and exercise psychologist, Guardian Permission granted to share information::  Yes, Verbal Permission Granted  Name::     Cassandra/Erica Fears, [place]::  [employer]ians[employer], [place]  Relationship::  Legal guardian, Group home IT trainer Information:  Cassandra/Erica: [phone]: [phone]  Housing/Transportation Living arrangements for the past 2 months:  [place] of Information:  Medical Team, [place] Patient Interpreter Needed:  None Criminal Activity/Legal Involvement Pertinent to Current Situation/Hospitalization:  No - Comment as needed Significant Relationships:  Other(Comment)(Group home staff) Lives with:  Facility Resident Do you feel safe going back to the place where you live?  Yes Need for family participation in patient care:  Yes (Comment)  Care giving concerns:  Patient is a resident at [place].   Social Worker assessment / plan:  Patient not fully oriented. CSW called one of his legal guardians, Cassandra. She confirmed that patient is a resident at [facility] and the plan is for him to return once stable for discharge. CSW confirmed with group home director. They will transport him back to the facility. No further concerns. CSW encouraged patient's guardian and group home director to contact CSW as needed. CSW will continue to follow patient for support and facilitate discharge back to group home once medically stable.  Employment status:  Disabled (Comment on whether or not currently receiving Disability) Insurance information:  Medicare PT Recommendations:  Not assessed at this time Information /  Referral to community resources:  Other (Comment Required)(Plan for return to group home.)  Patient/Family's Response to care:  Patient not fully oriented. Patient's guardian agreeable to return to group home. Patient's group home staff supportive and involved in patient's care. Patient's guardian and group home director appreciated social work intervention.  Patient/Family's Understanding of and Emotional Response to Diagnosis, Current Treatment, and Prognosis:  Patient not fully oriented. Patient's guardian and group home director have a good understanding of the reason for admission and need to return to group home once stable for discharge. Patient's guardian and group home director appear happy with hospital care.  Emotional Assessment Appearance:  Appears stated age Attitude/Demeanor/Rapport:  Unable to Assess Affect (typically observed):  Unable to Assess Orientation:  Oriented to Self, Oriented to Place Alcohol / Substance use:  Never Used Psych involvement (Current and /or in the community):  No (Comment)  Discharge Needs  Concerns to be addressed:  Care Coordination Readmission within the last 30 days:  No Current discharge risk:  None Barriers to Discharge:  Continued Medical Work up   Candie Chroman, LCSW 03/29/2017, 12:49 PM

## 2017-03-29 NOTE — Progress Notes (Addendum)
Pharmacy Antibiotic Note  Shane Gentry is a 41 y.o. male admitted on 03/27/2017 with pneumonia.  Pharmacy has been consulted for vancomycin and Zosyn dosing.   SCr has been rising over the past several days- up to 1.3 today. Vancomycin trough was drawn this evening and was in range at 54mcg/mL.  Plan: Continue Vancomycin 750mg  IV q12h.  Goal trough 15-20 mcg/mL. Zosyn 3.375g IV q8h (4 hour infusion). Follow c/s, clinical progression, LOT, renal function, repeat trough PRN  Height: 5\' 9"  (175.3 cm) Weight: 154 lb 5.2 oz (70 kg) IBW/kg (Calculated) : 70.7  Temp (24hrs), Avg:97.1 F (36.2 C), Min:94.8 F (34.9 C), Max:98.2 F (36.8 C)  Recent Labs  Lab 03/24/17 1017 03/27/17 1241 03/27/17 1310 03/27/17 1508 03/27/17 1622 03/27/17 2118 03/28/17 0320 03/29/17 0317 03/29/17 2053  WBC 7.5 4.3  --   --   --   --  7.4 11.1*  --   CREATININE 0.92 1.21  --   --  1.05  --  1.27* 1.31*  --   LATICACIDVEN  --   --  1.35 1.04  --  1.1  --   --   --   VANCOTROUGH  --   --   --   --   --   --   --   --  18    Estimated Creatinine Clearance: 72 mL/min (A) (by C-G formula based on SCr of 1.31 mg/dL (H)).    Allergies  Allergen Reactions  . Sulfa Antibiotics Hives    Vanc 11/19>> 11/21 VT = 18 on 750 q12 > cont Zosyn 11/19>>   11/19 BCx: ngtd 11/19 UCx: negative 11/20 MRSA PCR: negative   Thank you for allowing pharmacy to be a part of this patient's care.  Analiya Porco D. Manpreet Kemmer, PharmD, BCPS Clinical Pharmacist [phone] 03/29/2017 9:57 PM

## 2017-03-30 LAB — GLUCOSE, CAPILLARY
GLUCOSE-CAPILLARY: 74 mg/dL (ref 65–99)
GLUCOSE-CAPILLARY: 107 mg/dL — AB (ref 65–99)
Glucose-Capillary: 68 mg/dL (ref 65–99)
Glucose-Capillary: 75 mg/dL (ref 65–99)
Glucose-Capillary: 84 mg/dL (ref 65–99)
Glucose-Capillary: 99 mg/dL (ref 65–99)

## 2017-03-30 LAB — CBC
HEMATOCRIT: 40.2 % (ref 39.0–52.0)
Hemoglobin: 12.8 g/dL — ABNORMAL LOW (ref 13.0–17.0)
MCH: 29.7 pg (ref 26.0–34.0)
MCHC: 31.8 g/dL (ref 30.0–36.0)
MCV: 93.3 fL (ref 78.0–100.0)
Platelets: 60 10*3/uL — ABNORMAL LOW (ref 150–400)
RBC: 4.31 MIL/uL (ref 4.22–5.81)
RDW: 17.1 % — AB (ref 11.5–15.5)
WBC: 13.3 10*3/uL — ABNORMAL HIGH (ref 4.0–10.5)

## 2017-03-30 LAB — RENAL FUNCTION PANEL
ALBUMIN: 2.6 g/dL — AB (ref 3.5–5.0)
Anion gap: 6 (ref 5–15)
BUN: 15 mg/dL (ref 6–20)
CHLORIDE: 119 mmol/L — AB (ref 101–111)
CO2: 21 mmol/L — ABNORMAL LOW (ref 22–32)
Calcium: 9.3 mg/dL (ref 8.9–10.3)
Creatinine, Ser: 1.25 mg/dL — ABNORMAL HIGH (ref 0.61–1.24)
GFR calc Af Amer: >60 mL/min (ref >60)
GFR calc non Af Amer: >60 mL/min (ref >60)
GLUCOSE: 82 mg/dL (ref 65–99)
PHOSPHORUS: 3.2 mg/dL (ref 2.5–4.6)
POTASSIUM: 3.5 mmol/L (ref 3.5–5.1)
Sodium: 146 mmol/L — ABNORMAL HIGH (ref 135–145)

## 2017-03-30 MED ORDER — LEVOFLOXACIN 500 MG PO TABS
500.0000 mg | ORAL_TABLET | Freq: Every day | ORAL | Status: DC
  Administered 2017-03-30: 500 mg via ORAL
  Filled 2017-03-30 (×2): qty 1

## 2017-03-30 MED ORDER — RISPERIDONE MICROSPHERES 50 MG IM SUSR: 50.0000 mg | INTRAMUSCULAR | Status: DC

## 2017-03-30 NOTE — Progress Notes (Signed)
PROGRESS NOTE    Shane Gentry  BSJ:[id number] DOB: 1975/06/12 DOA: 03/27/2017 PCP: [name], MD   Specialists:     Brief Narrative:   41 year old long-term resident of group home Prior SBO 09/25/2015 hospitalized for the same Failure to thrive Hypothyroidism Schizoaffective disorder + MR Prior dysphagia and weight loss probably secondary to severe chronic constipation  Presented from group home with altered mental status Coughing day of admission On arrival 11/19 to emergency room core temperature of 88 patient was given warm fluids then chest x-ray revealed bilateral infiltrates suggestive of pneumonia glucose was 60 patient received 2-1/2 L normal saline for presumed aspiration pneumonia Labs on admission showed sodium 147 BUN/creatinine 14/1.2 LFTs normal Platelets 73  Assessment & Plan:   Principal Problem:   Aspiration pneumonitis (HCC) Active Problems:   Schizoaffective disorder, depressive type (HCC)   Failure to thrive in adult   Hyperthyroidism   Sepsis secondary to UTI ([place])   Sepsis ([place])   Probable aspiration pneumonia SLP has eva speech therapy evaluated recommend DYS 2 thin liquid Continue IV vancomycin, Zosyn and de-escalate rapidly to PO levaquin 11/22 as I feel probably had some element of aspiration Still mildly hypotensive so continuing D5 75 cc/H-changing to D5 half-normal Continue Solu-Cortef 100 mg every 8-->every 12 hourly change dose to 50, stopped on 11/22, Tylenol for fever  Hypothyroidism TSH slightly elevated at 7.7-this is a reported illness but I do not see any meds for the same will monitor and adjust if needed  Affective disorder, MR reported Resume Risperdal Consta 50 Q 14, temazepam 30 at bedtime, Depakote 1000 at bedtime, lithium 1 capsule at bedtime, lorazepam 2 mg twice daily Nursing to infomr if cannot get Risperdal consta  HTN holding metoprolol XL 25 as still hypotensive  chr kidney disease stage II-III baseline  creatinine anywhere from 1.0-1.2 currently 1.31 continue saline as above  Hyperlipidemia continue atorvastatin 20 every afternoon  Chronic severe constipation continue MiraLAX 17 g daily ensure proper bowel regimen  TCP unclear cause Appears to have been seen at [facility] oncology in 2010 but no information currently available at this time ge tplt smear and re-eval Hold all heparins  presumed full code SDU Inpatient   Consultants:   None  Procedures:   None  Antimicrobials:   Vancomycin, Zosyn 11/19   Subjective:  More agitated today  Pulling at equipment Large stool in bed Unable to completely interview patient given cognition  Objective: Vitals:   03/29/17 1750 03/29/17 1807 03/29/17 2013 03/30/17 0431  BP: (!) 194/170 118/70 109/67 100/63  Pulse: 65  76 (!) 54  Resp: 20  19 18   Temp:   98.2 F (36.8 C) 98.2 F (36.8 C)  TempSrc:   Axillary Oral  SpO2: 98%  97% 97%  Weight:   70 kg (154 lb 5.2 oz)   Height:        Intake/Output Summary (Last 24 hours) at 03/30/2017 1010 Last data filed at 03/30/2017 0845 Gross per 24 hour  Intake 1365 ml  Output 1150 ml  Net 215 ml   Filed Weights   03/27/17 1222 03/29/17 2013  Weight: 68 kg (150 lb) 70 kg (154 lb 5.2 oz)    Examination: EOMI NCAT chest is clinically clear Protruding tongue Hypothyroid-like facies S1-S2 no murmur rub or gallop Abdomen soft nontender nondistended    Data Reviewed: I have personally reviewed following labs and imaging studies  CBC: Recent Labs  Lab 03/24/17 1017 03/27/17 1241 03/28/17 0320 03/29/17 0317 03/30/17  0852  WBC 7.5 4.3 7.4 11.1* 13.3*  NEUTROABS  --  3.0  --  8.6*  --   HGB 13.6 13.8 11.5* 11.8* 12.8*  HCT 42.2 43.5 37.0* 37.6* 40.2  MCV 93.4 95.6 94.1 93.8 93.3  PLT 96* 74* 73* 77* 60*   Basic Metabolic Panel: Recent Labs  Lab 03/24/17 1017 03/27/17 1241 03/27/17 1622 03/28/17 0320 03/29/17 0317 03/30/17 0852  NA 145 145  --  147* 145 146*  K 4.2  4.1  --  4.1 3.7 3.5  CL 112* 115*  --  121* 120* 119*  CO2 29 26  --  20* 20* 21*  GLUCOSE 98 65  --  73 112* 82  BUN 15 17  --  14 14 15   CREATININE 0.92 1.21 1.05 1.27* 1.31* 1.25*  CALCIUM 10.3 9.4  --  8.0* 9.0 9.3  MG  --   --  2.1  --   --   --   PHOS  --   --   --   --  2.5 3.2   GFR: Estimated Creatinine Clearance: 75.4 mL/min (A) (by C-G formula based on SCr of 1.25 mg/dL (H)). Liver Function Tests: Recent Labs  Lab 03/27/17 1241 03/28/17 0320 03/29/17 0317 03/30/17 0852  AST 20 15  --   --   ALT 15* 11*  --   --   ALKPHOS 21* 17*  --   --   BILITOT 0.5 0.4  --   --   PROT 6.0* 5.1*  --   --   ALBUMIN 2.9* 2.3* 2.3* 2.6*   No results for input(s): LIPASE, AMYLASE in the last 168 hours. No results for input(s): AMMONIA in the last 168 hours. Coagulation Profile: Recent Labs  Lab 03/27/17 1622  INR 1.16   Cardiac Enzymes: Recent Labs  Lab 03/27/17 1622 03/27/17 2118 03/28/17 0320  TROPONINI <0.03 <0.03 <0.03   BNP (last 3 results) No results for input(s): PROBNP in the last 8760 hours. HbA1C: No results for input(s): HGBA1C in the last 72 hours. CBG: Recent Labs  Lab 03/29/17 1706 03/29/17 2011 03/29/17 2355 03/30/17 0424 03/30/17 0825  GLUCAP 104* 99 99 74 68   Lipid Profile: No results for input(s): CHOL, HDL, LDLCALC, TRIG, CHOLHDL, LDLDIRECT in the last 72 hours. Thyroid Function Tests: Recent Labs    03/27/17 1622  TSH 7.792*  FREET4 0.73   Anemia Panel: No results for input(s): VITAMINB12, FOLATE, FERRITIN, TIBC, IRON, RETICCTPCT in the last 72 hours. Urine analysis:    Component Value Date/Time   COLORURINE YELLOW 03/27/2017 2226   APPEARANCEUR CLEAR 03/27/2017 2226   APPEARANCEUR Clear 02/04/2015 0940   LABSPEC 1.011 03/27/2017 2226   PHURINE 5.0 03/27/2017 2226   GLUCOSEU NEGATIVE 03/27/2017 2226   HGBUR NEGATIVE 03/27/2017 2226   BILIRUBINUR NEGATIVE 03/27/2017 2226   BILIRUBINUR Negative 02/04/2015 0940   KETONESUR  NEGATIVE 03/27/2017 2226   PROTEINUR NEGATIVE 03/27/2017 2226   UROBILINOGEN 1.0 02/04/2015 1631   NITRITE NEGATIVE 03/27/2017 2226   LEUKOCYTESUR NEGATIVE 03/27/2017 2226   LEUKOCYTESUR Negative 02/04/2015 0940     Radiology Studies: Reviewed images personally in health database    Scheduled Meds: . atorvastatin  20 mg Oral q1800  . benzonatate  100 mg Oral Q8H  . divalproex  1,000 mg Oral QHS  . heparin  5,000 Units Subcutaneous Q8H  . hydrocortisone sod succinate (SOLU-CORTEF) inj  50 mg Intravenous Q12H  . lithium  600 mg Oral QHS  . LORazepam  2 mg Oral BID  . montelukast  10 mg Oral QHS  . pantoprazole  40 mg Oral Daily  . temazepam  30 mg Oral QHS   Continuous Infusions: . dextrose 5 % and 0.45% NaCl 75 mL/hr at 03/29/17 0848  . piperacillin-tazobactam (ZOSYN)  IV 3.375 g (03/30/17 0831)  . vancomycin Stopped (03/30/17 0317)     LOS: 3 days    Time spent: Lawrence, MD [facility]   If 7PM-7AM, please contact night-coverage [email or web address] Password [email or web address] 03/30/2017, 10:10 AM

## 2017-03-31 ENCOUNTER — Inpatient Hospital Stay (HOSPITAL_COMMUNITY): Payer: Medicare Other

## 2017-03-31 LAB — RENAL FUNCTION PANEL
ANION GAP: 5 (ref 5–15)
Albumin: 2.2 g/dL — ABNORMAL LOW (ref 3.5–5.0)
BUN: 13 mg/dL (ref 6–20)
CHLORIDE: 117 mmol/L — AB (ref 101–111)
CO2: 24 mmol/L (ref 22–32)
Calcium: 9 mg/dL (ref 8.9–10.3)
Creatinine, Ser: 1.21 mg/dL (ref 0.61–1.24)
GFR calc Af Amer: >60 mL/min (ref >60)
GFR calc non Af Amer: >60 mL/min (ref >60)
GLUCOSE: 102 mg/dL — AB (ref 65–99)
POTASSIUM: 2.9 mmol/L — AB (ref 3.5–5.1)
Phosphorus: 2.9 mg/dL (ref 2.5–4.6)
Sodium: 146 mmol/L — ABNORMAL HIGH (ref 135–145)

## 2017-03-31 LAB — CBC WITH DIFFERENTIAL/PLATELET
Basophils Absolute: 0 10*3/uL (ref 0.0–0.1)
Basophils Relative: 0 %
EOS PCT: 2 %
Eosinophils Absolute: 0.2 10*3/uL (ref 0.0–0.7)
HEMATOCRIT: 38.6 % — AB (ref 39.0–52.0)
Hemoglobin: 12.1 g/dL — ABNORMAL LOW (ref 13.0–17.0)
LYMPHS ABS: 2.3 10*3/uL (ref 0.7–4.0)
LYMPHS PCT: 26 %
MCH: 29.2 pg (ref 26.0–34.0)
MCHC: 31.3 g/dL (ref 30.0–36.0)
MCV: 93.2 fL (ref 78.0–100.0)
Monocytes Absolute: 0.6 10*3/uL (ref 0.1–1.0)
Monocytes Relative: 7 %
NEUTROS ABS: 5.6 10*3/uL (ref 1.7–7.7)
Neutrophils Relative %: 64 %
PLATELETS: 56 10*3/uL — AB (ref 150–400)
RBC: 4.14 MIL/uL — AB (ref 4.22–5.81)
RDW: 17 % — ABNORMAL HIGH (ref 11.5–15.5)
WBC: 8.6 10*3/uL (ref 4.0–10.5)

## 2017-03-31 LAB — GLUCOSE, CAPILLARY
GLUCOSE-CAPILLARY: 157 mg/dL — AB (ref 65–99)
GLUCOSE-CAPILLARY: 93 mg/dL (ref 65–99)
GLUCOSE-CAPILLARY: 78 mg/dL (ref 65–99)
GLUCOSE-CAPILLARY: 68 mg/dL (ref 65–99)
GLUCOSE-CAPILLARY: 128 mg/dL — AB (ref 65–99)
Glucose-Capillary: 49 mg/dL — ABNORMAL LOW (ref 65–99)
Glucose-Capillary: 59 mg/dL — ABNORMAL LOW (ref 65–99)
Glucose-Capillary: 75 mg/dL (ref 65–99)
Glucose-Capillary: 77 mg/dL (ref 65–99)
Glucose-Capillary: 82 mg/dL (ref 65–99)
Glucose-Capillary: 86 mg/dL (ref 65–99)

## 2017-03-31 LAB — DIC (DISSEMINATED INTRAVASCULAR COAGULATION)PANEL
Fibrinogen: 474 mg/dL (ref 210–475)
INR: 0.97
Prothrombin Time: 12.8 seconds (ref 11.4–15.2)
aPTT: 40 seconds — ABNORMAL HIGH (ref 24–36)

## 2017-03-31 LAB — PROCALCITONIN: Procalcitonin: 0.43 ng/mL

## 2017-03-31 LAB — DIC (DISSEMINATED INTRAVASCULAR COAGULATION) PANEL
D DIMER QUANT: 0.54 ug{FEU}/mL — AB (ref 0.00–0.50)
PLATELETS: 58 10*3/uL — AB (ref 150–400)
SMEAR REVIEW: NONE SEEN

## 2017-03-31 LAB — LACTIC ACID, PLASMA
LACTIC ACID, VENOUS: 1.6 mmol/L (ref 0.5–1.9)
LACTIC ACID, VENOUS: 1.3 mmol/L (ref 0.5–1.9)

## 2017-03-31 MED ORDER — POTASSIUM CHLORIDE 2 MEQ/ML IV SOLN
INTRAVENOUS | Status: DC
  Filled 2017-03-31: qty 1000

## 2017-03-31 MED ORDER — SODIUM CHLORIDE 0.9 % IV BOLUS (SEPSIS)
500.0000 mL | Freq: Once | INTRAVENOUS | Status: AC
  Administered 2017-03-31: 500 mL via INTRAVENOUS

## 2017-03-31 MED ORDER — RESOURCE THICKENUP CLEAR PO POWD
ORAL | Status: DC | PRN
  Filled 2017-03-31 (×3): qty 125

## 2017-03-31 MED ORDER — DEXTROSE 50 % IV SOLN
50.0000 mL | Freq: Once | INTRAVENOUS | Status: AC
Start: 2017-03-31 — End: 2017-03-31
  Administered 2017-03-31: 50 mL via INTRAVENOUS

## 2017-03-31 MED ORDER — GLUCOSE 40 % PO GEL
ORAL | Status: AC
  Filled 2017-03-31: qty 1

## 2017-03-31 MED ORDER — PIPERACILLIN-TAZOBACTAM 3.375 G IVPB
3.3750 g | Freq: Three times a day (TID) | INTRAVENOUS | Status: DC
  Administered 2017-03-31 – 2017-04-02 (×6): 3.375 g via INTRAVENOUS
  Filled 2017-03-31 (×7): qty 50

## 2017-03-31 MED ORDER — DEXTROSE 5 % IV SOLN
INTRAVENOUS | Status: DC
  Administered 2017-03-31: 05:00:00 via INTRAVENOUS

## 2017-03-31 MED ORDER — POTASSIUM CHLORIDE 2 MEQ/ML IV SOLN
INTRAVENOUS | Status: DC
  Administered 2017-03-31 – 2017-04-01 (×3): via INTRAVENOUS
  Filled 2017-03-31 (×6): qty 1000

## 2017-03-31 MED ORDER — HYDROCORTISONE NA SUCCINATE PF 100 MG IJ SOLR
100.0000 mg | Freq: Three times a day (TID) | INTRAMUSCULAR | Status: DC
  Administered 2017-03-31 – 2017-04-02 (×6): 100 mg via INTRAVENOUS
  Filled 2017-03-31 (×6): qty 2

## 2017-03-31 MED ORDER — VANCOMYCIN HCL IN DEXTROSE 750-5 MG/150ML-% IV SOLN
750.0000 mg | Freq: Two times a day (BID) | INTRAVENOUS | Status: DC
  Administered 2017-03-31 – 2017-04-01 (×4): 750 mg via INTRAVENOUS
  Filled 2017-03-31 (×5): qty 150

## 2017-03-31 MED ORDER — DEXTROSE 50 % IV SOLN
INTRAVENOUS | Status: AC
  Administered 2017-03-31: 50 mL via INTRAVENOUS
  Filled 2017-03-31: qty 50

## 2017-03-31 NOTE — Progress Notes (Signed)
RN called at [phone] for pt with an unreadable rectal temp since 2100, placed warm blankets on pt. RN reports CBG low during the night, treated with amp D50 with improvement. Advised RN to page MD to make them aware about the events during the night. No RRT interventions.

## 2017-03-31 NOTE — Progress Notes (Signed)
Unable to obtain rectal temp again; pt remains on bair hugger. Will pass along to day shift.  Sherlie Ban, RN

## 2017-03-31 NOTE — Progress Notes (Signed)
Pharmacy Antibiotic Note  Shane Gentry is a 41 y.o. male admitted on 03/27/2017 with pneumonia.  Pharmacy has been consulted for vancomycin and Zosyn dosing. Pt transitioned to levofloxacin PO yesterday but now thought to have aspiration event so returned to Zosyn/vancomycin. SCr remains stable and previous VT therapeutic at 18 mcg/ml.  Plan: -Vancomycin 750mg  IV q12h -Zosyn 3.375g IV q8h EI -Monitor cultures, LOT, renal funx  Height: 5\' 9"  (175.3 cm) Weight: 154 lb 5.2 oz (70 kg) IBW/kg (Calculated) : 70.7  Temp (24hrs), Avg:96 F (35.6 C), Min:91.8 F (33.2 C), Max:98.3 F (36.8 C)  Recent Labs  Lab 03/27/17 1241 03/27/17 1310 03/27/17 1508 03/27/17 1622 03/27/17 2118 03/28/17 0320 03/29/17 0317 03/29/17 2053 03/30/17 0852 03/31/17 0618 03/31/17 0619  WBC 4.3  --   --   --   --  7.4 11.1*  --  13.3*  --  8.6  CREATININE 1.21  --   --  1.05  --  1.27* 1.31*  --  1.25* 1.21  --   LATICACIDVEN  --  1.35 1.04  --  1.1  --   --   --   --   --   --   VANCOTROUGH  --   --   --   --   --   --   --  18  --   --   --     Estimated Creatinine Clearance: 77.9 mL/min (by C-G formula based on SCr of 1.21 mg/dL).    Allergies  Allergen Reactions  . Sulfa Antibiotics Hives    Vanc 11/19>>11/22; 11/23 >> 11/21 VT = 18 on 750 q12 > cont Zosyn 11/19>>11/22; 11/23 >> Levofloxacin 11/22>11/23   11/19 BCx: ngtd 11/19 UCx: negative 11/20 MRSA PCR: negative   Thank you for allowing pharmacy to be a part of this patient's care.  Arrie Senate, PharmD, BCPS PGY-2 Cardiology Pharmacy Resident Pager: [phone] 03/31/2017

## 2017-03-31 NOTE — Progress Notes (Signed)
Taran, NT informed me of patient's CBG of 59 and he was unable to obtain a oral or axillary temperature of the patient. Gave patient a box of boost breeze. Patient's skin feels cool to touch. Then attempted to obtain patient's temperature rectally, but the thermometer would not read. Notified rapid response. Notified Jeannette Corpus, NP.

## 2017-03-31 NOTE — Progress Notes (Signed)
Pt sats 90 on RA, lethargic with response to sternal rub, bladder scan done 875 no9ted to be in bladder, Pt BPs 80s/50s. MD called with up new orders received.

## 2017-03-31 NOTE — Progress Notes (Signed)
Rechecked patient's CBG and it went down to 49. Gave patient an amp of D50. Placed warm blankets on patient.

## 2017-03-31 NOTE — Progress Notes (Addendum)
Pts CBG 68, magic cup given, will plan for recheck, CBG 77

## 2017-03-31 NOTE — Progress Notes (Signed)
Unable to obtain rectal temp on pt, bair hugger applied per order.  Sherlie Ban, RN

## 2017-03-31 NOTE — Progress Notes (Addendum)
   03/31/17 1215  Vitals  Temp 99.7 F (37.6 C)  Pulse Rate 98  ECG Heart Rate 100  Resp (!) 29  Oxygen Therapy  SpO2 90 %   Bare Hugger turned off.  Pt is alert answering questions. Pt ate Dys2 diet with thin liquids, pt was supervised and HOB >45 degrees after eating pt OBS to have rhonchus cough. Speech Theray at [facility] and was updated.

## 2017-03-31 NOTE — Progress Notes (Signed)
  Speech Language Pathology Treatment: Dysphagia  Patient Details Name: Dantrell Schertzer MRN: [id number] DOB: 09/03/1975 Today's Date: 03/31/2017 Time: 6237-6283 SLP Time Calculation (min) (ACUTE ONLY): 21 min  Assessment / Plan / Recommendation Clinical Impression  RN reported pt was suspected to possibly aspirated yesterday and transferred to ICU and reported very wet and congested when assisting with lunch today. SLP found pt's vocal quality at baseline to be wet and cleared with verbal prompts for clear throat/cough. No significant difference between nectar and honey thick re: wet vocal quality however  liquid downgrade to honey and likely recommend repeat MBS prior to discharge. Continue Dys 2.    HPI HPI: 41 year old male living in a group home with a history of ADD, anxiety, asthma, depression, schizoaffective disorder from a group home who presented to the ED with AMS and lethargy. ED visit on 11/16 for evaluation of urinary frequency and being off balance.CT no acute abnormality and pt discharged. Chest x-ray revealed patchy bibasilar airspace opacification is worrisome for pneumonia.       SLP Plan  Continue with current plan of care       Recommendations  Diet recommendations: Dysphagia 2 (fine chop);Honey-thick liquid Liquids provided via: Cup;No straw Medication Administration: Crushed with puree Supervision: Patient able to self feed;Full supervision/cueing for compensatory strategies Compensations: Minimize environmental distractions;Slow rate;Small sips/bites                Oral Care Recommendations: Oral care BID Follow up Recommendations: 24 hour supervision/assistance SLP Visit Diagnosis: Dysphagia, oral phase (R13.11) Plan: Continue with current plan of care       GO                [name] 03/31/2017, 2:32 PM   [name] [employer] [phone]

## 2017-03-31 NOTE — Progress Notes (Signed)
PROGRESS NOTE    Shane Gentry  WRU:[id number] DOB: 02-23-1976 DOA: 03/27/2017 PCP: [name], MD   Specialists:     Brief Narrative:   41 year old long-term resident of group home Prior SBO 09/25/2015 hospitalized for the same Failure to thrive Hypothyroidism Schizoaffective disorder + MR Prior dysphagia and weight loss probably secondary to severe chronic constipation  Presented from group home with altered mental status Coughing day of admission On arrival 11/19 to emergency room core temperature of 88 patient was given warm fluids then chest x-ray revealed bilateral infiltrates suggestive of pneumonia glucose was 60 patient received 2-1/2 L normal saline for presumed aspiration pneumonia Labs on admission showed sodium 147 BUN/creatinine 14/1.2 LFTs normal Platelets 73  Assessment & Plan:   Principal Problem:   Aspiration pneumonitis (HCC) Active Problems:   Schizoaffective disorder, depressive type (HCC)   Failure to thrive in adult   Hyperthyroidism   Sepsis secondary to UTI ([place])   Sepsis ([place])   Probable aspiration pneumonia SLP has eva speech therapy evaluated recommend DYS 2 thin liquid---he probably aspirated agin evening 11/22 Was de-escalated to po levaquin 11/22 Restart broad vanc zosyn Bc x 2, pct, lacti9c and stat P CXR Resume solu cortef 100 q8 Will ask CCM to eval if no improvement later today Start on IVF with D5  Met encephalopathy 2/2 to sepsis vs hypoglycemia Place on d5 saline with 60 of k Labs again in am--recheck sugars q 2 for 6 episodes and reassess  Hypothyroidism TSH slightly elevated at 7.7-this is a reported illness but I do not see any meds for the same will monitor and adjust if needed  Affective disorder, MR reported Holding all meds  Risperdal Consta 50 Q 14,  temazepam 30 at bedtime,  Depakote 1000 at bedtime , lithium 1 capsule at bedtime,  lorazepam 2 mg twice daily  HTN holding metoprolol XL 25 as still  hypotensive  chr kidney disease stage II-III baseline creatinine anywhere from 1.0-1.2 currently 1.31 continue saline as above  Hyperlipidemia continue atorvastatin 20 every afternoon  Chronic severe constipation continue MiraLAX 17 g daily ensure proper bowel regimen  TCP unclear cause Appears to have been seen at [facility] oncology in 2010 but no information currently available at this time ge tplt smear and re-eval Hold all heparins Check DIC panel  presumed full code SDU Inpatient D/w [facility]   Consultants:   None  Procedures:   None  Antimicrobials:   Vancomycin, Zosyn 11/19   Subjective: Events overnight noted sent to SDU 2/2 low bs, hypothermia as well as altered mental status Unresponsive but slowly opens eyes if agitated H/o obtained mainly from RN    Objective: Vitals:   03/31/17 0600 03/31/17 0700 03/31/17 0703 03/31/17 0741  BP: 104/79  (!) 86/54   Pulse: (!) 53 (!) 58 (!) 49 67  Resp: (!) 28 (!) 22 (!) 26 20  TempSrc: Rectal Rectal    SpO2: 93% 93% 90% 94%  Weight:      Height:        Intake/Output Summary (Last 24 hours) at 03/31/2017 0806 Last data filed at 03/31/2017 0600 Gross per 24 hour  Intake 1360.17 ml  Output 575 ml  Net 785.17 ml   Filed Weights   03/27/17 1222 03/29/17 2013  Weight: 68 kg (150 lb) 70 kg (154 lb 5.2 oz)    Examination: Lethargic gcs ~ 11 Hypothyroid-like facies S1-S2 no murmur rub or gallop Chest clear in axialla and ant Abdomen soft  nontender nondistended Not foll commands  Data Reviewed: I have personally reviewed following labs and imaging studies  CBC: Recent Labs  Lab 03/27/17 1241 03/28/17 0320 03/29/17 0317 03/30/17 0852 03/31/17 0619  WBC 4.3 7.4 11.1* 13.3* 8.6  NEUTROABS 3.0  --  8.6*  --  5.6  HGB 13.8 11.5* 11.8* 12.8* 12.1*  HCT 43.5 37.0* 37.6* 40.2 38.6*  MCV 95.6 94.1 93.8 93.3 93.2  PLT 74* 73* 77* 60* 56*   Basic Metabolic Panel: Recent Labs  Lab  03/27/17 1241 03/27/17 1622 03/28/17 0320 03/29/17 0317 03/30/17 0852 03/31/17 0618  NA 145  --  147* 145 146* 146*  K 4.1  --  4.1 3.7 3.5 2.9*  CL 115*  --  121* 120* 119* 117*  CO2 26  --  20* 20* 21* 24  GLUCOSE 65  --  73 112* 82 102*  BUN 17  --  _0 CREATININE 1.21 1.05 1.27* 1.31* 1.25* 1.21  CALCIUM 9.4  --  8.0* 9.0 9.3 9.0  MG  --  2.1  --   --   --   --   PHOS  --   --   --  2.5 3.2 2.9   GFR: Estimated Creatinine Clearance: 77.9 mL/min (by C-G formula based on SCr of 1.21 mg/dL). Liver Function Tests: Recent Labs  Lab 03/27/17 1241 03/28/17 0320 03/29/17 0317 03/30/17 0852 03/31/17 0618  AST 20 15  --   --   --   ALT 15* 11*  --   --   --   ALKPHOS 21* 17*  --   --   --   BILITOT 0.5 0.4  --   --   --   PROT 6.0* 5.1*  --   --   --   ALBUMIN 2.9* 2.3* 2.3* 2.6* 2.2*   No results for input(s): LIPASE, AMYLASE in the last 168 hours. No results for input(s): AMMONIA in the last 168 hours. Coagulation Profile: Recent Labs  Lab 03/27/17 1622  INR 1.16   Cardiac Enzymes: Recent Labs  Lab 03/27/17 1622 03/27/17 2118 03/28/17 0320  TROPONINI <0.03 <0.03 <0.03   BNP (last 3 results) No results for input(s): PROBNP in the last 8760 hours. HbA1C: No results for input(s): HGBA1C in the last 72 hours. CBG: Recent Labs  Lab 03/31/17 0003 03/31/17 0412 03/31/17 0457 03/31/17 0523 03/31/17 0748  GLUCAP 75 59* 49* 157* 93   Lipid Profile: No results for input(s): CHOL, HDL, LDLCALC, TRIG, CHOLHDL, LDLDIRECT in the last 72 hours. Thyroid Function Tests: No results for input(s): TSH, T4TOTAL, FREET4, T3FREE, THYROIDAB in the last 72 hours. Anemia Panel: No results for input(s): VITAMINB12, FOLATE, FERRITIN, TIBC, IRON, RETICCTPCT in the last 72 hours. Urine analysis:    Component Value Date/Time   COLORURINE YELLOW 03/27/2017 2226   APPEARANCEUR CLEAR 03/27/2017 2226   APPEARANCEUR Clear 02/04/2015 0940   LABSPEC 1.011 03/27/2017 2226    PHURINE 5.0 03/27/2017 2226   GLUCOSEU NEGATIVE 03/27/2017 2226   HGBUR NEGATIVE 03/27/2017 2226   BILIRUBINUR NEGATIVE 03/27/2017 2226   BILIRUBINUR Negative 02/04/2015 0940   KETONESUR NEGATIVE 03/27/2017 2226   PROTEINUR NEGATIVE 03/27/2017 2226   UROBILINOGEN 1.0 02/04/2015 1631   NITRITE NEGATIVE 03/27/2017 2226   LEUKOCYTESUR NEGATIVE 03/27/2017 2226   LEUKOCYTESUR Negative 02/04/2015 0940     Radiology Studies: Reviewed images personally in health database    Scheduled Meds: . atorvastatin  20 mg Oral q1800  . benzonatate  100  mg Oral Q8H  . divalproex  1,000 mg Oral QHS  . heparin  5,000 Units Subcutaneous Q8H  . levofloxacin  500 mg Oral Daily  . lithium  600 mg Oral QHS  . LORazepam  2 mg Oral BID  . montelukast  10 mg Oral QHS  . pantoprazole  40 mg Oral Daily  . [START ON 04/06/2017] risperiDONE microspheres  50 mg Intramuscular Q14 Days  . temazepam  30 mg Oral QHS   Continuous Infusions: . dextrose 50 mL/hr at 03/31/17 0600  . dextrose 5 % and 0.45% NaCl 75 mL/hr at 03/29/17 0848     LOS: 4 days    Time spent: [place]enzie, MD [facility] ([facility]   If 7PM-7AM, please contact night-coverage [email or web address] Password [email or web address] 03/31/2017, 8:06 AM

## 2017-03-31 NOTE — Progress Notes (Signed)
CBG is now 157. Started D5 at 26ml/hr and place patient on telemetry per order. Attempted to check rectal temperature again and thermometer still not reading.

## 2017-04-01 LAB — CBC WITH DIFFERENTIAL/PLATELET
Basophils Absolute: 0 10*3/uL (ref 0.0–0.1)
Basophils Relative: 0 %
Eosinophils Absolute: 0 10*3/uL (ref 0.0–0.7)
Eosinophils Relative: 0 %
HCT: 40 % (ref 39.0–52.0)
Hemoglobin: 12.9 g/dL — ABNORMAL LOW (ref 13.0–17.0)
Lymphocytes Relative: 7 %
Lymphs Abs: 0.9 10*3/uL (ref 0.7–4.0)
MCH: 29.5 pg (ref 26.0–34.0)
MCHC: 32.3 g/dL (ref 30.0–36.0)
MCV: 91.5 fL (ref 78.0–100.0)
Monocytes Absolute: 0.7 10*3/uL (ref 0.1–1.0)
Monocytes Relative: 6 %
Neutro Abs: 11.3 10*3/uL — ABNORMAL HIGH (ref 1.7–7.7)
Neutrophils Relative %: 87 %
Platelets: 69 10*3/uL — ABNORMAL LOW (ref 150–400)
RBC: 4.37 MIL/uL (ref 4.22–5.81)
RDW: 16.8 % — ABNORMAL HIGH (ref 11.5–15.5)
WBC: 13 10*3/uL — ABNORMAL HIGH (ref 4.0–10.5)

## 2017-04-01 LAB — CULTURE, BLOOD (ROUTINE X 2)
CULTURE: NO GROWTH
Culture: NO GROWTH
Special Requests: ADEQUATE
Special Requests: ADEQUATE

## 2017-04-01 LAB — GLUCOSE, CAPILLARY
GLUCOSE-CAPILLARY: 124 mg/dL — AB (ref 65–99)
Glucose-Capillary: 104 mg/dL — ABNORMAL HIGH (ref 65–99)
Glucose-Capillary: 112 mg/dL — ABNORMAL HIGH (ref 65–99)
Glucose-Capillary: 125 mg/dL — ABNORMAL HIGH (ref 65–99)
Glucose-Capillary: 86 mg/dL (ref 65–99)

## 2017-04-01 LAB — COMPREHENSIVE METABOLIC PANEL
ALT: 24 U/L (ref 17–63)
AST: 21 U/L (ref 15–41)
Albumin: 2.3 g/dL — ABNORMAL LOW (ref 3.5–5.0)
Alkaline Phosphatase: 27 U/L — ABNORMAL LOW (ref 38–126)
Anion gap: 4 — ABNORMAL LOW (ref 5–15)
BUN: 13 mg/dL (ref 6–20)
CO2: 25 mmol/L (ref 22–32)
Calcium: 9.4 mg/dL (ref 8.9–10.3)
Chloride: 118 mmol/L — ABNORMAL HIGH (ref 101–111)
Creatinine, Ser: 1.45 mg/dL — ABNORMAL HIGH (ref 0.61–1.24)
GFR calc Af Amer: >60 mL/min (ref >60)
GFR calc non Af Amer: 58 mL/min — ABNORMAL LOW (ref >60)
Glucose, Bld: 107 mg/dL — ABNORMAL HIGH (ref 65–99)
Potassium: 4.1 mmol/L (ref 3.5–5.1)
Sodium: 147 mmol/L — ABNORMAL HIGH (ref 135–145)
Total Bilirubin: 0.9 mg/dL (ref 0.3–1.2)
Total Protein: 5.4 g/dL — ABNORMAL LOW (ref 6.5–8.1)

## 2017-04-01 LAB — PROCALCITONIN: PROCALCITONIN: 0.2 ng/mL

## 2017-04-01 MED ORDER — DEXTROSE-NACL 5-0.9 % IV SOLN
INTRAVENOUS | Status: DC
  Administered 2017-04-01 – 2017-04-02 (×2): via INTRAVENOUS

## 2017-04-01 MED ORDER — LITHIUM CARBONATE 300 MG PO CAPS
600.0000 mg | ORAL_CAPSULE | Freq: Every day | ORAL | Status: DC
  Administered 2017-04-01 – 2017-04-02 (×2): 600 mg via ORAL
  Filled 2017-04-01 (×2): qty 2

## 2017-04-01 MED ORDER — TEMAZEPAM 15 MG PO CAPS
30.0000 mg | ORAL_CAPSULE | Freq: Every day | ORAL | Status: DC
  Administered 2017-04-01 – 2017-04-12 (×12): 30 mg via ORAL
  Filled 2017-04-01 (×12): qty 2

## 2017-04-01 MED ORDER — LORAZEPAM 1 MG PO TABS
2.0000 mg | ORAL_TABLET | Freq: Two times a day (BID) | ORAL | Status: DC
  Administered 2017-04-01 – 2017-04-13 (×25): 2 mg via ORAL
  Filled 2017-04-01 (×25): qty 2

## 2017-04-01 MED ORDER — DIVALPROEX SODIUM 250 MG PO DR TAB
1000.0000 mg | DELAYED_RELEASE_TABLET | Freq: Every day | ORAL | Status: DC
  Administered 2017-04-01 – 2017-04-05 (×5): 1000 mg via ORAL
  Filled 2017-04-01 (×6): qty 2
  Filled 2017-04-01: qty 4

## 2017-04-01 NOTE — Progress Notes (Signed)
PROGRESS NOTE    Shane Gentry  TKW:[id number] DOB: February 13, 1976 DOA: 03/27/2017 PCP: [name], MD   Specialists:     Brief Narrative:   41 year old long-term resident of group home Prior SBO 09/25/2015 hospitalized for the same Failure to thrive Hypothyroidism Schizoaffective disorder + MR Prior dysphagia and weight loss probably secondary to severe chronic constipation  Presented from group home with altered mental status Coughing day of admission On arrival 11/19 to emergency room core temperature of 88 patient was given warm fluids then chest x-ray revealed bilateral infiltrates suggestive of pneumonia glucose was 60 patient received 2-1/2 L normal saline for presumed aspiration pneumonia Labs on admission showed sodium 147 BUN/creatinine 14/1.2 LFTs normal Platelets 31  Was admitted but had recurrent aspiration and had to go to the ICU on 11/23 however has improved But currently now back on IV antibiotics despite trying to take him off and is still hypo-glycemic so needing further management  Assessment & Plan:   Principal Problem:   Aspiration pneumonitis ([place]) Active Problems:   Schizoaffective disorder, depressive type ([place])   Failure to thrive in adult   Hyperthyroidism   Sepsis secondary to UTI ([place])   Sepsis ([place])   Recurrent aspiration pneumonia SLP has eva speech therapy evaluated recommend DYS 2 thin liquid---he probably aspirated agin evening 11/22 Was de-escalated to po levaquin 11/22 Restart broad vanc zosyn 11/23 given events of 11/22 where he went hypothermic hypoglycemic and looked like he had worsening sepsis and aspiration Bc x 2, pct, is pending Resume solu cortef 100 q8--> need regular prednisone low-dose to keep sugars up?  Met encephalopathy 2/2 to sepsis vs hypoglycemia Still hypoglycemic in the 60 range this morning but now up to 112 Changing fluids from D5 with K to D5 without K 100 cc/h  Hypothyroidism TSH slightly elevated at  7.7-this is a reported illness but I do not see any meds for the same will monitor and adjust if needed  Affective disorder, MR reported Resuming home meds 11/24 Risperdal Consta 50 Q 14,  temazepam 30 at bedtime, Depakote 1000 at bedtime lithium 1 capsule at bedtime,  lorazepam 2 mg twice daily  HTN holding metoprolol XL 25 as still hypotensive  chr kidney disease stage II-III baseline creatinine anywhere from 1.0-1.2 currently 1.45 Changing fluids as above 11/24  Hyperlipidemia continue atorvastatin 20 every afternoon  Chronic severe constipation continue MiraLAX 17 g daily ensure proper bowel regimen  TCP unclear cause Appears to have been seen at [facility] oncology in 2010 but no information currently available at this time DIC panel is negative think this is probably just medication related  presumed full code SDU Inpatient Discussed with Cassandra healthcare power of attorney regarding overall goals and she agrees may need to have this addressed sooner rather than later   Consultants:   None  Procedures:   None  Antimicrobials:   Vancomycin, Zosyn 11/19   Subjective:  Overall looks much better eating and drinking at bedside asking for chocolate ice cream no chest pain No nausea No vomiting Sugar was low this morning however in the 60s and is come up to 112 No blurred or double vision Overall much improved    Objective: Vitals:   04/01/17 0302 04/01/17 0400 04/01/17 0500 04/01/17 0701  BP: 112/89 114/78 111/73 116/73  Pulse: 88 77 77 77  Resp: (!) 31 (!) 22 (!) 26 (!) 24  Temp: 98.4 F (36.9 C) 98.4 F (36.9 C) 98.4 F (36.9 C) 97.9 F (36.6 C)  TempSrc:      SpO2: 93% 96% 96% 96%  Weight:      Height:        Intake/Output Summary (Last 24 hours) at 04/01/2017 0855 Last data filed at 04/01/2017 0700 Gross per 24 hour  Intake 2903 ml  Output 2700 ml  Net 203 ml   Filed Weights   03/27/17 1222 03/29/17 2013  Weight: 68 kg (150 lb) 70 kg  (154 lb 5.2 oz)    Examination:  Much more awake oriented Hypothyroid-like facies S1-S2 no murmur rub or gallop Chest clear in axialla and ant Abdomen soft nontender nondistended Following commands intermittently  Data Reviewed: I have personally reviewed following labs and imaging studies  CBC: Recent Labs  Lab 03/27/17 1241 03/28/17 0320 03/29/17 0317 03/30/17 0852 03/31/17 0619 03/31/17 0858 04/01/17 0137  WBC 4.3 7.4 11.1* 13.3* 8.6  --  13.0*  NEUTROABS 3.0  --  8.6*  --  5.6  --  11.3*  HGB 13.8 11.5* 11.8* 12.8* 12.1*  --  12.9*  HCT 43.5 37.0* 37.6* 40.2 38.6*  --  40.0  MCV 95.6 94.1 93.8 93.3 93.2  --  91.5  PLT 74* 73* 77* 60* 56* 58* 69*   Basic Metabolic Panel: Recent Labs  Lab 03/27/17 1622 03/28/17 0320 03/29/17 0317 03/30/17 0852 03/31/17 0618 04/01/17 0137  NA  --  147* 145 146* 146* 147*  K  --  4.1 3.7 3.5 2.9* 4.1  CL  --  121* 120* 119* 117* 118*  CO2  --  20* 20* 21* 24 25  GLUCOSE  --  73 112* 82 102* 107*  BUN  --  14 14 15 13 13   CREATININE 1.05 1.27* 1.31* 1.25* 1.21 1.45*  CALCIUM  --  8.0* 9.0 9.3 9.0 9.4  MG 2.1  --   --   --   --   --   PHOS  --   --  2.5 3.2 2.9  --    GFR: Estimated Creatinine Clearance: 65 mL/min (A) (by C-G formula based on SCr of 1.45 mg/dL (H)). Liver Function Tests: Recent Labs  Lab 03/27/17 1241 03/28/17 0320 03/29/17 0317 03/30/17 0852 03/31/17 0618 04/01/17 0137  AST 20 15  --   --   --  21  ALT 15* 11*  --   --   --  24  ALKPHOS 21* 17*  --   --   --  27*  BILITOT 0.5 0.4  --   --   --  0.9  PROT 6.0* 5.1*  --   --   --  5.4*  ALBUMIN 2.9* 2.3* 2.3* 2.6* 2.2* 2.3*   No results for input(s): LIPASE, AMYLASE in the last 168 hours. No results for input(s): AMMONIA in the last 168 hours. Coagulation Profile: Recent Labs  Lab 03/27/17 1622 03/31/17 0858  INR 1.16 0.97   Cardiac Enzymes: Recent Labs  Lab 03/27/17 1622 03/27/17 2118 03/28/17 0320  TROPONINI <0.03 <0.03 <0.03   BNP  (last 3 results) No results for input(s): PROBNP in the last 8760 hours. HbA1C: No results for input(s): HGBA1C in the last 72 hours. CBG: Recent Labs  Lab 03/31/17 1931 03/31/17 2103 04/01/17 0014 04/01/17 0347 04/01/17 0758  GLUCAP 77 128* 104* 86 112*   Lipid Profile: No results for input(s): CHOL, HDL, LDLCALC, TRIG, CHOLHDL, LDLDIRECT in the last 72 hours. Thyroid Function Tests: No results for input(s): TSH, T4TOTAL, FREET4, T3FREE, THYROIDAB in the last 72 hours. Anemia Panel:  No results for input(s): VITAMINB12, FOLATE, FERRITIN, TIBC, IRON, RETICCTPCT in the last 72 hours. Urine analysis:    Component Value Date/Time   COLORURINE YELLOW 03/27/2017 2226   APPEARANCEUR CLEAR 03/27/2017 2226   APPEARANCEUR Clear 02/04/2015 0940   LABSPEC 1.011 03/27/2017 2226   PHURINE 5.0 03/27/2017 2226   GLUCOSEU NEGATIVE 03/27/2017 2226   HGBUR NEGATIVE 03/27/2017 2226   BILIRUBINUR NEGATIVE 03/27/2017 2226   BILIRUBINUR Negative 02/04/2015 0940   KETONESUR NEGATIVE 03/27/2017 2226   PROTEINUR NEGATIVE 03/27/2017 2226   UROBILINOGEN 1.0 02/04/2015 1631   NITRITE NEGATIVE 03/27/2017 2226   LEUKOCYTESUR NEGATIVE 03/27/2017 2226   LEUKOCYTESUR Negative 02/04/2015 0940     Radiology Studies: Reviewed images personally in health database    Scheduled Meds: . benzonatate  100 mg Oral Q8H  . divalproex  1,000 mg Oral QHS  . heparin  5,000 Units Subcutaneous Q8H  . hydrocortisone sod succinate (SOLU-CORTEF) inj  100 mg Intravenous Q8H  . lithium  600 mg Oral QHS  . LORazepam  2 mg Oral BID  . montelukast  10 mg Oral QHS  . temazepam  30 mg Oral QHS   Continuous Infusions: . dextrose 5 % with kcl 100 mL/hr at 04/01/17 0608  . piperacillin-tazobactam (ZOSYN)  IV 3.375 g (04/01/17 0847)  . vancomycin 750 mg (04/01/17 0846)     LOS: 5 days    Time spent: [place], MD [facility]   If 7PM-7AM, please contact  night-coverage [email or web address] Password [email or web address] 04/01/2017, 8:55 AM

## 2017-04-02 ENCOUNTER — Inpatient Hospital Stay (HOSPITAL_COMMUNITY): Payer: Medicare Other

## 2017-04-02 ENCOUNTER — Other Ambulatory Visit: Payer: Self-pay

## 2017-04-02 LAB — RENAL FUNCTION PANEL
ALBUMIN: 2.2 g/dL — AB (ref 3.5–5.0)
Anion gap: 5 (ref 5–15)
BUN: 13 mg/dL (ref 6–20)
CHLORIDE: 119 mmol/L — AB (ref 101–111)
CO2: 24 mmol/L (ref 22–32)
CREATININE: 1.28 mg/dL — AB (ref 0.61–1.24)
Calcium: 9.1 mg/dL (ref 8.9–10.3)
GFR calc Af Amer: >60 mL/min (ref >60)
GLUCOSE: 100 mg/dL — AB (ref 65–99)
PHOSPHORUS: 3.2 mg/dL (ref 2.5–4.6)
POTASSIUM: 4 mmol/L (ref 3.5–5.1)
Sodium: 148 mmol/L — ABNORMAL HIGH (ref 135–145)

## 2017-04-02 LAB — CBC WITH DIFFERENTIAL/PLATELET
Basophils Absolute: 0 10*3/uL (ref 0.0–0.1)
Basophils Relative: 0 %
EOS ABS: 0 10*3/uL (ref 0.0–0.7)
EOS PCT: 0 %
HCT: 36.8 % — ABNORMAL LOW (ref 39.0–52.0)
Hemoglobin: 11.6 g/dL — ABNORMAL LOW (ref 13.0–17.0)
LYMPHS ABS: 1.3 10*3/uL (ref 0.7–4.0)
LYMPHS PCT: 7 %
MCH: 29.1 pg (ref 26.0–34.0)
MCHC: 31.5 g/dL (ref 30.0–36.0)
MCV: 92.2 fL (ref 78.0–100.0)
MONO ABS: 1.1 10*3/uL — AB (ref 0.1–1.0)
Monocytes Relative: 6 %
Neutro Abs: 16.6 10*3/uL — ABNORMAL HIGH (ref 1.7–7.7)
Neutrophils Relative %: 87 %
PLATELETS: 62 10*3/uL — AB (ref 150–400)
RBC: 3.99 MIL/uL — AB (ref 4.22–5.81)
RDW: 17 % — AB (ref 11.5–15.5)
WBC: 19 10*3/uL — AB (ref 4.0–10.5)

## 2017-04-02 LAB — GLUCOSE, CAPILLARY
GLUCOSE-CAPILLARY: 120 mg/dL — AB (ref 65–99)
GLUCOSE-CAPILLARY: 125 mg/dL — AB (ref 65–99)
Glucose-Capillary: 110 mg/dL — ABNORMAL HIGH (ref 65–99)
Glucose-Capillary: 96 mg/dL (ref 65–99)
Glucose-Capillary: 98 mg/dL (ref 65–99)
Glucose-Capillary: 93 mg/dL (ref 65–99)

## 2017-04-02 LAB — PROCALCITONIN

## 2017-04-02 LAB — MAGNESIUM: MAGNESIUM: 2.3 mg/dL (ref 1.7–2.4)

## 2017-04-02 MED ORDER — DEXTROSE 5 % IV SOLN
INTRAVENOUS | Status: DC
  Administered 2017-04-02 – 2017-04-03 (×2): via INTRAVENOUS
  Administered 2017-04-03: 50 mL via INTRAVENOUS
  Administered 2017-04-04 – 2017-04-11 (×17): via INTRAVENOUS

## 2017-04-02 MED ORDER — METOPROLOL SUCCINATE ER 25 MG PO TB24
12.5000 mg | ORAL_TABLET | Freq: Every day | ORAL | Status: DC
  Administered 2017-04-02 – 2017-04-03 (×2): 12.5 mg via ORAL
  Filled 2017-04-02 (×2): qty 1

## 2017-04-02 MED ORDER — HYDROCORTISONE NA SUCCINATE PF 100 MG IJ SOLR
50.0000 mg | Freq: Two times a day (BID) | INTRAMUSCULAR | Status: DC
  Administered 2017-04-02 – 2017-04-06 (×9): 50 mg via INTRAVENOUS
  Filled 2017-04-02 (×9): qty 2

## 2017-04-02 MED ORDER — AMOXICILLIN-POT CLAVULANATE 875-125 MG PO TABS
1.0000 | ORAL_TABLET | Freq: Two times a day (BID) | ORAL | Status: DC
  Administered 2017-04-02 – 2017-04-06 (×10): 1 via ORAL
  Filled 2017-04-02 (×12): qty 1

## 2017-04-02 NOTE — Progress Notes (Signed)
PROGRESS NOTE    Shane Gentry  EWY:[id number] DOB: 22-Mar-1976 DOA: 03/27/2017 PCP: [name], MD   Specialists:     Brief Narrative:   41 year old long-term resident of group home Prior SBO 09/25/2015 hospitalized for the same Failure to thrive Hypothyroidism Schizoaffective disorder + MR Prior dysphagia and weight loss probably secondary to severe chronic constipation  Presented from group home with altered mental status Coughing day of admission On arrival 11/19 to emergency room core temperature of 88 patient was given warm fluids then chest x-ray revealed bilateral infiltrates suggestive of pneumonia glucose was 60 patient received 2-1/2 L normal saline for presumed aspiration pneumonia Labs on admission showed sodium 147 BUN/creatinine 14/1.2 LFTs normal Platelets 62  Was admitted but had recurrent aspiration and had to go to the ICU on 11/23 however has improved But currently now back on IV antibiotics despite trying to take him off and is still hypo-glycemic so needing further management  Assessment & Plan:   Principal Problem:   Aspiration pneumonitis ([place]) Active Problems:   Schizoaffective disorder, depressive type ([place])   Failure to thrive in adult   Hyperthyroidism   Sepsis secondary to UTI ([place])   Sepsis ([place])   Recurrent aspiration pneumonia SLP has eva speech therapy evaluated recommend DYS 2 thin liquid---he probably aspirated agin evening 11/22 Was de-escalated to po levaquin 11/22 d vanc zosyn 11/23-tapered to Augmentin 11/25 again and monitor effect Bc x 2 NGTD, pct lower Resume solu cortef 100 q8-->50 q12 11/25  Hypoxic resp failure Probably 2/2 pna Keep on nasal oxygen and sats above 88% CXr shows fluid s infection--d/c IVF 11/25 if stabilizing  Met encephalopathy 2/2 to sepsis vs hypoglycemia Still hypoglycemic in the 60 range this morning but now up to 112 Fluid now just d5, remove K Monitor sugars  Hypothyroidism TSH  slightly elevated at 7.7-this is a reported illness but I do not see any meds for the same will monitor and adjust if needed  Affective disorder, MR reported Resuming home meds 11/24 Risperdal Consta 50 Q 14,  temazepam 30 at bedtime, Depakote 1000 at bedtime lithium 1 capsule at bedtime,  lorazepam 2 mg twice daily  HTN holding metoprolol XL 25 as still hypotensive  chr kidney disease stage II-III baseline creatinine anywhere from 1.0-1.2 currently 1.45 Changing fluids as above 11/24  Hyperlipidemia continue atorvastatin 20 every afternoon  Chronic severe constipation continue MiraLAX 17 g daily ensure proper bowel regimen  TCP unclear cause Appears to have been seen at [facility] oncology in 2010 but no information currently available at this time DIC panel is negative think this is probably just medication related  presumed full code SDU Inpatient Discussed with Cassandra healthcare power of attorney regarding overall goals and she agrees may need to have this addressed sooner rather than later   Consultants:   None  Procedures:   None  Antimicrobials:   Vancomycin, Zosyn 11/19   Subjective:  Interactive eating and drinking very well No other issue tol diet Nursing reports hypoxia    Objective: Vitals:   04/02/17 0800 04/02/17 0900 04/02/17 0932 04/02/17 1000  BP: 127/80 124/79  124/75  Pulse: 77 93 (!) 104 (!) 105  Resp: (!) 27 (!) 24 (!) 45 (!) 35  Temp: 97.9 F (36.6 C) 97.9 F (36.6 C) 97.7 F (36.5 C) 97.7 F (36.5 C)  TempSrc:      SpO2: 96% 93% (!) 88% 92%  Weight:      Height:  Intake/Output Summary (Last 24 hours) at 04/02/2017 1134 Last data filed at 04/02/2017 0800 Gross per 24 hour  Intake 1334.17 ml  Output 3070 ml  Net -1735.83 ml   Filed Weights   03/27/17 1222 03/29/17 2013  Weight: 68 kg (150 lb) 70 kg (154 lb 5.2 oz)    Examination:  oriented Poor dentition S1-S2 no murmur rub or gallop Chest clear in axilla    Abdomen soft nontender nondistended Following commands   Data Reviewed: I have personally reviewed following labs and imaging studies  CBC: Recent Labs  Lab 03/27/17 1241  03/29/17 0317 03/30/17 0852 03/31/17 0619 03/31/17 0858 04/01/17 0137 04/02/17 0240  WBC 4.3   < > 11.1* 13.3* 8.6  --  13.0* 19.0*  NEUTROABS 3.0  --  8.6*  --  5.6  --  11.3* 16.6*  HGB 13.8   < > 11.8* 12.8* 12.1*  --  12.9* 11.6*  HCT 43.5   < > 37.6* 40.2 38.6*  --  40.0 36.8*  MCV 95.6   < > 93.8 93.3 93.2  --  91.5 92.2  PLT 74*   < > 77* 60* 56* 58* 69* 62*   < > = values in this interval not displayed.   Basic Metabolic Panel: Recent Labs  Lab 03/27/17 1622  03/29/17 0317 03/30/17 0852 03/31/17 0618 04/01/17 0137 04/02/17 0240  NA  --    < > 145 146* 146* 147* 148*  K  --    < > 3.7 3.5 2.9* 4.1 4.0  CL  --    < > 120* 119* 117* 118* 119*  CO2  --    < > 20* 21* _0 GLUCOSE  --    < > 112* 82 102* 107* 100*  BUN  --    < > _1 CREATININE 1.05   < > 1.31* 1.25* 1.21 1.45* 1.28*  CALCIUM  --    < > 9.0 9.3 9.0 9.4 9.1  MG 2.1  --   --   --   --   --   --   PHOS  --   --  2.5 3.2 2.9  --  3.2   < > = values in this interval not displayed.   GFR: Estimated Creatinine Clearance: 73.7 mL/min (A) (by C-G formula based on SCr of 1.28 mg/dL (H)). Liver Function Tests: Recent Labs  Lab 03/27/17 1241 03/28/17 0320 03/29/17 0317 03/30/17 0852 03/31/17 0618 04/01/17 0137 04/02/17 0240  AST 20 15  --   --   --  21  --   ALT 15* 11*  --   --   --  24  --   ALKPHOS 21* 17*  --   --   --  27*  --   BILITOT 0.5 0.4  --   --   --  0.9  --   PROT 6.0* 5.1*  --   --   --  5.4*  --   ALBUMIN 2.9* 2.3* 2.3* 2.6* 2.2* 2.3* 2.2*   No results for input(s): LIPASE, AMYLASE in the last 168 hours. No results for input(s): AMMONIA in the last 168 hours. Coagulation Profile: Recent Labs  Lab 03/27/17 1622 03/31/17 0858  INR 1.16 0.97   Cardiac Enzymes: Recent Labs  Lab  03/27/17 1622 03/27/17 2118 03/28/17 0320  TROPONINI <0.03 <0.03 <0.03   BNP (last 3 results) No results for input(s): PROBNP in the last 8760 hours. HbA1C:  No results for input(s): HGBA1C in the last 72 hours. CBG: Recent Labs  Lab 04/01/17 1156 04/01/17 1934 04/01/17 2356 04/02/17 0309 04/02/17 0839  GLUCAP 124* 125* 125* 110* 96   Lipid Profile: No results for input(s): CHOL, HDL, LDLCALC, TRIG, CHOLHDL, LDLDIRECT in the last 72 hours. Thyroid Function Tests: No results for input(s): TSH, T4TOTAL, FREET4, T3FREE, THYROIDAB in the last 72 hours. Anemia Panel: No results for input(s): VITAMINB12, FOLATE, FERRITIN, TIBC, IRON, RETICCTPCT in the last 72 hours. Urine analysis:    Component Value Date/Time   COLORURINE YELLOW 03/27/2017 2226   APPEARANCEUR CLEAR 03/27/2017 2226   APPEARANCEUR Clear 02/04/2015 0940   LABSPEC 1.011 03/27/2017 2226   PHURINE 5.0 03/27/2017 2226   GLUCOSEU NEGATIVE 03/27/2017 2226   HGBUR NEGATIVE 03/27/2017 2226   BILIRUBINUR NEGATIVE 03/27/2017 2226   BILIRUBINUR Negative 02/04/2015 0940   KETONESUR NEGATIVE 03/27/2017 2226   PROTEINUR NEGATIVE 03/27/2017 2226   UROBILINOGEN 1.0 02/04/2015 1631   NITRITE NEGATIVE 03/27/2017 2226   LEUKOCYTESUR NEGATIVE 03/27/2017 2226   LEUKOCYTESUR Negative 02/04/2015 0940     Radiology Studies: Reviewed images personally in health database    Scheduled Meds: . amoxicillin-clavulanate  1 tablet Oral Q12H  . benzonatate  100 mg Oral Q8H  . divalproex  1,000 mg Oral QHS  . heparin  5,000 Units Subcutaneous Q8H  . hydrocortisone sod succinate (SOLU-CORTEF) inj  50 mg Intravenous Q12H  . lithium  600 mg Oral QHS  . LORazepam  2 mg Oral BID  . montelukast  10 mg Oral QHS  . temazepam  30 mg Oral QHS   Continuous Infusions: . dextrose 50 mL/hr at 04/02/17 0902     LOS: 6 days    Time spent: [place], MD [facility]   If 7PM-7AM, please contact  night-coverage [email or web address] Password [facility] 04/02/2017, 11:34 AM

## 2017-04-02 NOTE — Progress Notes (Signed)
Chest radiograph had to changed to portable this morning.  Shane Gentry was afraid to leave the unit while it was dark outside.  He would agree to a portable film.

## 2017-04-02 NOTE — Progress Notes (Signed)
Noted 18 beats run of Vtach. Dr Verlon Au made aware. See new orders

## 2017-04-03 ENCOUNTER — Inpatient Hospital Stay (HOSPITAL_COMMUNITY): Payer: Medicare Other

## 2017-04-03 DIAGNOSIS — I481 Persistent atrial fibrillation: Secondary | ICD-10-CM

## 2017-04-03 LAB — MAGNESIUM: Magnesium: 2.1 mg/dL (ref 1.7–2.4)

## 2017-04-03 LAB — GLUCOSE, CAPILLARY
GLUCOSE-CAPILLARY: 113 mg/dL — AB (ref 65–99)
GLUCOSE-CAPILLARY: 87 mg/dL (ref 65–99)
GLUCOSE-CAPILLARY: 89 mg/dL (ref 65–99)
GLUCOSE-CAPILLARY: 92 mg/dL (ref 65–99)
Glucose-Capillary: 110 mg/dL — ABNORMAL HIGH (ref 65–99)
Glucose-Capillary: 62 mg/dL — ABNORMAL LOW (ref 65–99)
Glucose-Capillary: 107 mg/dL — ABNORMAL HIGH (ref 65–99)
Glucose-Capillary: 167 mg/dL — ABNORMAL HIGH (ref 65–99)

## 2017-04-03 LAB — BASIC METABOLIC PANEL
Anion gap: 3 — ABNORMAL LOW (ref 5–15)
BUN: 9 mg/dL (ref 6–20)
CHLORIDE: 119 mmol/L — AB (ref 101–111)
CO2: 30 mmol/L (ref 22–32)
CREATININE: 1.21 mg/dL (ref 0.61–1.24)
Calcium: 9.7 mg/dL (ref 8.9–10.3)
GFR calc Af Amer: >60 mL/min (ref >60)
GFR calc non Af Amer: >60 mL/min (ref >60)
Glucose, Bld: 99 mg/dL (ref 65–99)
Potassium: 3.4 mmol/L — ABNORMAL LOW (ref 3.5–5.1)
SODIUM: 152 mmol/L — AB (ref 135–145)

## 2017-04-03 LAB — CBC WITH DIFFERENTIAL/PLATELET
BASOS ABS: 0 10*3/uL (ref 0.0–0.1)
Basophils Relative: 0 %
EOS ABS: 0.2 10*3/uL (ref 0.0–0.7)
Eosinophils Relative: 1 %
HCT: 41.2 % (ref 39.0–52.0)
Hemoglobin: 12.8 g/dL — ABNORMAL LOW (ref 13.0–17.0)
LYMPHS PCT: 9 %
Lymphs Abs: 1.8 10*3/uL (ref 0.7–4.0)
MCH: 29.4 pg (ref 26.0–34.0)
MCHC: 31.1 g/dL (ref 30.0–36.0)
MCV: 94.7 fL (ref 78.0–100.0)
Monocytes Absolute: 2.1 10*3/uL — ABNORMAL HIGH (ref 0.1–1.0)
Monocytes Relative: 11 %
Neutro Abs: 15.5 10*3/uL — ABNORMAL HIGH (ref 1.7–7.7)
Neutrophils Relative %: 79 %
Platelets: 57 10*3/uL — ABNORMAL LOW (ref 150–400)
RBC: 4.35 MIL/uL (ref 4.22–5.81)
RDW: 17.4 % — ABNORMAL HIGH (ref 11.5–15.5)
WBC: 19.5 10*3/uL — AB (ref 4.0–10.5)

## 2017-04-03 LAB — COMPREHENSIVE METABOLIC PANEL
ALT: 20 U/L (ref 17–63)
AST: 16 U/L (ref 15–41)
Albumin: 2.6 g/dL — ABNORMAL LOW (ref 3.5–5.0)
Alkaline Phosphatase: 28 U/L — ABNORMAL LOW (ref 38–126)
Anion gap: 5 (ref 5–15)
BUN: 10 mg/dL (ref 6–20)
CHLORIDE: 119 mmol/L — AB (ref 101–111)
CO2: 31 mmol/L (ref 22–32)
CREATININE: 1.18 mg/dL (ref 0.61–1.24)
Calcium: 9.8 mg/dL (ref 8.9–10.3)
GFR calc Af Amer: >60 mL/min (ref >60)
GFR calc non Af Amer: >60 mL/min (ref >60)
Glucose, Bld: 78 mg/dL (ref 65–99)
Potassium: 3.6 mmol/L (ref 3.5–5.1)
SODIUM: 155 mmol/L — AB (ref 135–145)
Total Bilirubin: 0.4 mg/dL (ref 0.3–1.2)
Total Protein: 5.7 g/dL — ABNORMAL LOW (ref 6.5–8.1)

## 2017-04-03 LAB — BETA-HYDROXYBUTYRIC ACID: BETA-HYDROXYBUTYRIC ACID: 0.06 mmol/L (ref 0.05–0.27)

## 2017-04-03 LAB — LITHIUM LEVEL: LITHIUM LVL: 0.57 mmol/L — AB (ref 0.60–1.20)

## 2017-04-03 MED ORDER — DILTIAZEM HCL 100 MG IV SOLR
5.0000 mg/h | INTRAVENOUS | Status: DC
  Administered 2017-04-03 – 2017-04-04 (×2): 5 mg/h via INTRAVENOUS
  Filled 2017-04-03 (×2): qty 100

## 2017-04-03 MED ORDER — FREE WATER
200.0000 mL | Freq: Three times a day (TID) | Status: DC
  Administered 2017-04-03 – 2017-04-11 (×20): 200 mL via ORAL

## 2017-04-03 MED ORDER — METOPROLOL TARTRATE 5 MG/5ML IV SOLN
2.5000 mg | Freq: Four times a day (QID) | INTRAVENOUS | Status: DC | PRN
  Administered 2017-04-03: 2.5 mg via INTRAVENOUS
  Filled 2017-04-03: qty 5

## 2017-04-03 MED ORDER — METOPROLOL TARTRATE 5 MG/5ML IV SOLN: 2.5000 mg | Freq: Four times a day (QID) | INTRAVENOUS | Status: DC

## 2017-04-03 NOTE — Progress Notes (Signed)
Pt HR in the 120s-140s sustaining. 2.5mg  IV metoprolol given and HR still elevated. Verlon Au, MD notified about patient status. RN will continue to monitor and administer PRNs as ordered.

## 2017-04-03 NOTE — Progress Notes (Addendum)
PROGRESS NOTE    Jamonta Goerner  WJX:[id number] DOB: [birth year] DOA: 03/27/2017 PCP: [name], MD   Specialists:     Brief Narrative:   41 year old long-term resident of group home Prior SBO 09/25/2015 hospitalized for the same Failure to thrive Hypothyroidism Schizoaffective disorder + MR Prior dysphagia and weight loss probably secondary to severe chronic constipation  Presented from group home with altered mental status Coughing day of admission On arrival 11/19 to emergency room core temperature of 88 patient was given warm fluids then chest x-ray revealed bilateral infiltrates suggestive of pneumonia glucose was 60 patient received 2-1/2 L normal saline for presumed aspiration pneumonia Labs on admission showed sodium 147 BUN/creatinine 14/1.2 LFTs normal Platelets 75  Was admitted but had recurrent aspiration and had to go to the ICU on 11/23 however has improved But currently now back on IV antibiotics despite trying to take him off and is still hypo-glycemic so needing further management  Assessment & Plan:   Principal Problem:   Aspiration pneumonitis ([place]) Active Problems:   Schizoaffective disorder, depressive type ([place])   Failure to thrive in adult   Hyperthyroidism   Sepsis secondary to UTI ([place])   Sepsis ([place])   Recurrent aspiration pneumonia SLP has eva speech therapy evaluated recommend DYS 2 thin liquid---he probably aspirated agin evening 11/22 Was de-escalated to po levaquin 11/22 d vanc zosyn 11/23-tapered to Augmentin 11/25 again and monitor effect Bc x 2 NGTD, pct lower Resume solu cortef 100 q8-->50 q12 11/25   Continued hypoglycemia Etiology is unclear Because of his persistent hypoglycemia we will rule out rare causes such as insulinoma nursing order has been placed for insulin level, C-peptide, beta hydroxybutyrate and proinsulin levels  Hypoxic resp failure Probably 2/2 pna--slightly exacerbated and now requiring nasal oxygen and  sats above 88% CXR 11/25 shows fluid infection Repeat chest x-ray 1 view 11/26 and follow may need to adjust D5   Met encephalopathy 2/2 to sepsis vs hypoglycemia Still hypoglycemic in the 60 range this morning but now up to 112 Fluid now just d5-see above discussion  Hypothyroidism TSH slightly elevated at 7.7-this is a reported illness but I do not see any meds for the same will monitor and adjust if needed  Affective disorder, MR reported Resuming home meds 11/24 Risperdal Consta 50 Q 14,  temazepam 30 at bedtime, Depakote 1000 at bedtime lithium 1 capsule at bedtime,  lorazepam 2 mg twice daily  HTN holding metoprolol XL 25 as still hypotensive  chr kidney disease stage II-III baseline creatinine anywhere from 1.0-1.2 currently 1.45 ?nephrogenic induced Lithium DI? Changing fluids as above 11/24 BUN/creatinine better at 10/1.18 however now hypernatremic Will give free water D5 125 cc/h x 56 hours and rpt bmet Hold lithium today Will ask nephrology to see if any worse of pm labs Force oral fluids  Hyperlipidemia continue atorvastatin 20 every afternoon  Chronic severe constipation continue MiraLAX 17 g daily ensure proper bowel regimen  TCP unclear cause Appears to have been seen at [facility] oncology in 2010 but no information currently available at this time DIC panel is negative think this is probably just medication related  presumed full code SDU Inpatient Discussed with [place] of attorney regarding overall goals and she agrees may need to have this addressed sooner rather than later   Consultants:   None  Procedures:   None  Antimicrobials:   Vancomycin, Zosyn 11/19   Subjective:  Interactive Nursing reports tachycardia and hypertension Patient is coughing some He  is awake and not really eating much note that blood sugars again were in the low 60s Seems to be at his regular mental baseline     Objective: Vitals:    04/02/17 2310 04/03/17 0000 04/03/17 0320 04/03/17 0751  BP: 121/75  110/74 110/75  Pulse: 88 78 74 63  Resp: (!) 34  (!) 31 (!) 25  Temp: 97.9 F (36.6 C)  98.4 F (36.9 C) 98.6 F (37 C)  TempSrc: Oral  Oral Oral  SpO2: 98% 97% 93% 95%  Weight:   79.6 kg (175 lb 7.8 oz)   Height:        Intake/Output Summary (Last 24 hours) at 04/03/2017 1011 Last data filed at 04/03/2017 0855 Gross per 24 hour  Intake 1948.33 ml  Output 3600 ml  Net -1651.67 ml   Filed Weights   03/27/17 1222 03/29/17 2013 04/03/17 0320  Weight: 68 kg (150 lb) 70 kg (154 lb 5.2 oz) 79.6 kg (175 lb 7.8 oz)    Examination:  oriented as per his usual baseline Poor dentition S1-S2 no murmur rub or gallop Chest overall clear with some transmitted upper sounds but no rales although coarse breath sounds bilaterally posteriorly Abdomen soft nontender nondistended Following commands Unable to have much meaningful interaction with him  Data Reviewed: I have personally reviewed following labs and imaging studies  CBC: Recent Labs  Lab 03/29/17 0317 03/30/17 0852 03/31/17 0619 03/31/17 0858 04/01/17 0137 04/02/17 0240 04/03/17 0816  WBC 11.1* 13.3* 8.6  --  13.0* 19.0* 19.5*  NEUTROABS 8.6*  --  5.6  --  11.3* 16.6* 15.5*  HGB 11.8* 12.8* 12.1*  --  12.9* 11.6* 12.8*  HCT 37.6* 40.2 38.6*  --  40.0 36.8* 41.2  MCV 93.8 93.3 93.2  --  91.5 92.2 94.7  PLT 77* 60* 56* 58* 69* 62* 57*   Basic Metabolic Panel: Recent Labs  Lab 03/27/17 1622  03/29/17 0317 03/30/17 0852 03/31/17 0618 04/01/17 0137 04/02/17 0240 04/02/17 1232 04/03/17 0816  NA  --    < > 145 146* 146* 147* 148*  --  155*  K  --    < > 3.7 3.5 2.9* 4.1 4.0  --  3.6  CL  --    < > 120* 119* 117* 118* 119*  --  119*  CO2  --    < > 20* 21* 24 25 24   --  31  GLUCOSE  --    < > 112* 82 102* 107* 100*  --  78  BUN  --    < > 14 15 13 13 13   --  10  CREATININE 1.05   < > 1.31* 1.25* 1.21 1.45* 1.28*  --  1.18  CALCIUM  --    < > 9.0  9.3 9.0 9.4 9.1  --  9.8  MG 2.1  --   --   --   --   --   --  2.3 2.1  PHOS  --   --  2.5 3.2 2.9  --  3.2  --   --    < > = values in this interval not displayed.   GFR: Estimated Creatinine Clearance: 80.7 mL/min (by C-G formula based on SCr of 1.18 mg/dL). Liver Function Tests: Recent Labs  Lab 03/27/17 1241 03/28/17 0320  03/30/17 3536 03/31/17 0618 04/01/17 0137 04/02/17 0240 04/03/17 0816  AST 20 15  --   --   --  21  --  16  ALT 15* 11*  --   --   --  24  --  20  ALKPHOS 21* 17*  --   --   --  27*  --  28*  BILITOT 0.5 0.4  --   --   --  0.9  --  0.4  PROT 6.0* 5.1*  --   --   --  5.4*  --  5.7*  ALBUMIN 2.9* 2.3*   < > 2.6* 2.2* 2.3* 2.2* 2.6*   < > = values in this interval not displayed.   No results for input(s): LIPASE, AMYLASE in the last 168 hours. No results for input(s): AMMONIA in the last 168 hours. Coagulation Profile: Recent Labs  Lab 03/27/17 1622 03/31/17 0858  INR 1.16 0.97   Cardiac Enzymes: Recent Labs  Lab 03/27/17 1622 03/27/17 2118 03/28/17 0320  TROPONINI <0.03 <0.03 <0.03   BNP (last 3 results) No results for input(s): PROBNP in the last 8760 hours. HbA1C: No results for input(s): HGBA1C in the last 72 hours. CBG: Recent Labs  Lab 04/02/17 1942 04/03/17 0007 04/03/17 0321 04/03/17 0749 04/03/17 0859  GLUCAP 93 89 110* 62* 113*   Lipid Profile: No results for input(s): CHOL, HDL, LDLCALC, TRIG, CHOLHDL, LDLDIRECT in the last 72 hours. Thyroid Function Tests: No results for input(s): TSH, T4TOTAL, FREET4, T3FREE, THYROIDAB in the last 72 hours. Anemia Panel: No results for input(s): VITAMINB12, FOLATE, FERRITIN, TIBC, IRON, RETICCTPCT in the last 72 hours. Urine analysis:    Component Value Date/Time   COLORURINE YELLOW 03/27/2017 2226   APPEARANCEUR CLEAR 03/27/2017 2226   APPEARANCEUR Clear 02/04/2015 0940   LABSPEC 1.011 03/27/2017 2226   PHURINE 5.0 03/27/2017 2226   GLUCOSEU NEGATIVE 03/27/2017 2226   HGBUR  NEGATIVE 03/27/2017 2226   BILIRUBINUR NEGATIVE 03/27/2017 2226   BILIRUBINUR Negative 02/04/2015 0940   KETONESUR NEGATIVE 03/27/2017 2226   PROTEINUR NEGATIVE 03/27/2017 2226   UROBILINOGEN 1.0 02/04/2015 1631   NITRITE NEGATIVE 03/27/2017 2226   LEUKOCYTESUR NEGATIVE 03/27/2017 2226   LEUKOCYTESUR Negative 02/04/2015 0940     Radiology Studies: Reviewed images personally in health database    Scheduled Meds: . amoxicillin-clavulanate  1 tablet Oral Q12H  . benzonatate  100 mg Oral Q8H  . divalproex  1,000 mg Oral QHS  . heparin  5,000 Units Subcutaneous Q8H  . hydrocortisone sod succinate (SOLU-CORTEF) inj  50 mg Intravenous Q12H  . lithium  600 mg Oral QHS  . LORazepam  2 mg Oral BID  . metoprolol succinate  12.5 mg Oral Daily  . montelukast  10 mg Oral QHS  . temazepam  30 mg Oral QHS   Continuous Infusions: . dextrose 50 mL/hr at 04/03/17 0600     LOS: 7 days    Time spent: [place], MD [facility] ([facility]   If 7PM-7AM, please contact night-coverage [email or web address] Password [email or web address] 04/03/2017, 10:11 AM

## 2017-04-03 NOTE — Progress Notes (Signed)
  Speech Language Pathology Treatment: Dysphagia  Patient Details Name: Muscab Brenneman MRN: [id number] DOB: [birth year] Today's Date: 04/03/2017 Time: 2947-6546 SLP Time Calculation (min) (ACUTE ONLY): 24 min  Assessment / Plan / Recommendation Clinical Impression  Elyon easily distracted today by discomfort of catheter; assisted in repositioning and heart rate elevated 122-147 at times. RN aware and present providing meds (for tachycardia) in pudding. Pt with increased difficulty propelling and initiating swallow with effortful/tense swallow attributed to aforementioned. Given additional time to produce repetitive swallows, he was successful at clearing moderate amount and suctioning for the remainder. Two sips honey thick caffeine free coke with similar results. Hopeful as heart rate drops and catheter discomfort eases, he will return to baseline level of oral dysphagia without need for downgrade to puree. Check oral cavity for pocketed food following meals and meds. Will continue to follow.    HPI HPI: 41 year old male living in a group home with a history of ADD, anxiety, asthma, depression, schizoaffective disorder from a group home who presented to the ED with AMS and lethargy. ED visit on 11/16 for evaluation of urinary frequency and being off balance.CT no acute abnormality and pt discharged. Chest x-ray revealed patchy bibasilar airspace opacification is worrisome for pneumonia.       SLP Plan  Continue with current plan of care       Recommendations  Diet recommendations: Dysphagia 2 (fine chop);Honey-thick liquid Liquids provided via: Cup;No straw Medication Administration: Crushed with puree Supervision: Patient able to self feed;Full supervision/cueing for compensatory strategies Compensations: Minimize environmental distractions;Slow rate;Small sips/bites;Lingual sweep for clearance of pocketing;Monitor for anterior loss Postural Changes and/or Swallow Maneuvers: Seated  upright 90 degrees                Oral Care Recommendations: Oral care BID Follow up Recommendations: 24 hour supervision/assistance SLP Visit Diagnosis: Dysphagia, oral phase (R13.11) Plan: Continue with current plan of care       GO                [name] 04/03/2017, 11:08 AM  [name] [employer] [phone]

## 2017-04-03 NOTE — Consult Note (Signed)
Cardiology Consultation:   Patient ID: Shane Gentry; [id number]; Sep 18, 1975   Admit date: 03/27/2017 Date of Consult: 04/03/2017  Primary Care Provider: [name], MD Primary Cardiologist: Shane Gentry   Patient Profile:   Shane Gentry is a 41 y.o. male with a hx of SOB 09/2015, HLD, schizoaffective disorders, anxiety, chronic constipation, failure to thrive, hypothyroidism and depression  who is being seen today for the evaluation of afib/aflutter at the request of Dr. Verlon Gentry.   No prior cardiac hx.   History of Present Illness:   Shane Gentry Presented 11/19 from group home with AMS. Noted core temp of 88 given warm fluids.  Also noted blood sugar of 60. Admitted for recurrent aspiration pneumonia. treated with abx. He continued to have hypoglycemia of unclear etiology. TSH elevated at 7. Metoprolol held/reduce due to hypotension.   Patient went into afib/aflutter rvr today. Started on Cardizem gtt 5mg /hour. Unable to titrate further due to soft low BP.   History limited due to psychiatric issue. He does feels palpitations.   Past Medical History:  Diagnosis Date  . ADD (attention deficit disorder)   . Anxiety   . Asthma   . Depression   . HLD (hyperlipidemia)   . Prostate cancer ([place])   . Schizoaffective disorder ([place])   . Seasonal allergies     Past Surgical History:  Procedure Laterality Date  . ESOPHAGOGASTRODUODENOSCOPY (EGD) WITH PROPOFOL N/A 04/16/2015   Procedure: ESOPHAGOGASTRODUODENOSCOPY (EGD) WITH PROPOFOL;  Surgeon: Shane Class, MD;  Location: [facility] END[facility]COPY;  Service: Endoscopy;  Laterality: N/A;  . NO PAST SURGERIES       Inpatient Medications: Scheduled Meds: . amoxicillin-clavulanate  1 tablet Oral Q12H  . benzonatate  100 mg Oral Q8H  . divalproex  1,000 mg Oral QHS  . free water  200 mL Oral Q8H  . heparin  5,000 Units Subcutaneous Q8H  . hydrocortisone sod succinate (SOLU-CORTEF) inj  50 mg Intravenous Q12H  .  LORazepam  2 mg Oral BID  . montelukast  10 mg Oral QHS  . temazepam  30 mg Oral QHS   Continuous Infusions: . dextrose 125 mL/hr at 04/03/17 1424  . diltiazem (CARDIZEM) infusion 5 mg/hr (04/03/17 1530)   PRN Meds: levalbuterol, [DISCONTINUED] ondansetron **OR** ondansetron (ZOFRAN) IV, RESOURCE THICKENUP CLEAR  Allergies:    Allergies  Allergen Reactions  . Sulfa Antibiotics Hives    Social History:   Social History   Socioeconomic History  . Marital status: Single    Spouse name: Not on file  . Number of children: Not on file  . Years of education: Not on file  . Highest education level: Not on file  Social Needs  . Financial resource strain: Not on file  . Food insecurity - worry: Not on file  . Food insecurity - inability: Not on file  . Transportation needs - medical: Not on file  . Transportation needs - non-medical: Not on file  Occupational History  . Not on file  Tobacco Use  . Smoking status: Never Smoker  . Smokeless tobacco: Never Used  Substance and Sexual Activity  . Alcohol use: No    Alcohol/week: 0.0 oz  . Drug use: No  . Sexual activity: Not Currently  Other Topics Concern  . Not on file  Social History Narrative  . Not on file    Family History:   History reviewed. No pertinent family history.  Unable to provide information due to psychiatric issue.   ROS:  Please  see the history of present illness.  ROS  All other ROS reviewed and negative.     Physical Exam/Data:   Vitals:   04/03/17 0751 04/03/17 1043 04/03/17 1134 04/03/17 1532  BP: 110/75 110/75 97/71 95/60   Pulse: 63 (!) 130 (!) 138 (!) 125  Resp: (!) 25  (!) 24 (!) 28  Temp: 98.6 F (37 C)  98.1 F (36.7 C) 98.5 F (36.9 C)  TempSrc: Oral  Oral Oral  SpO2: 95%  97% 95%  Weight:      Height:        Intake/Output Summary (Last 24 hours) at 04/03/2017 1551 Last data filed at 04/03/2017 1520 Gross per 24 hour  Intake 1290 ml  Output 4725 ml  Net -3435 ml   Filed  Weights   03/27/17 1222 03/29/17 2013 04/03/17 0320  Weight: 150 lb (68 kg) 154 lb 5.2 oz (70 kg) 175 lb 7.8 oz (79.6 kg)   Body mass index is 25.91 kg/m.  General:  Well nourished, well developed, in no acute distress HEENT: normal Lymph: no adenopathy Neck: no JVD Endocrine:  No thryomegaly Vascular: No carotid bruits; FA pulses 2+ bilaterally without bruits  Cardiac:  normal S1, S2; Irregularly irregular; no murmur Lungs:  Course breath sound without wheezing, rhonchi or rales  Abd: soft, nontender, no hepatomegaly  Ext: no edema Musculoskeletal:  No deformities, BUE and BLE strength normal and equal Skin: warm and dry  Neuro:  CNs 2-12 intact, no focal abnormalities noted Psych:  Not   EKG:  The EKG was personally reviewed and demonstrates:  Atrial fibrillation at rapid ventricular rate Telemetry:  Telemetry was personally reviewed and demonstrates:  Afib/aflutter at rate of 130s  Relevant CV Studies: Pending echo  Laboratory Data:  Chemistry Recent Labs  Lab 04/02/17 0240 04/03/17 0816 04/03/17 1345  NA 148* 155* 152*  K 4.0 3.6 3.4*  CL 119* 119* 119*  CO2 24 31 30   GLUCOSE 100* 78 99  BUN 13 10 9   CREATININE 1.28* 1.18 1.21  CALCIUM 9.1 9.8 9.7  GFRNONAA >60 >60 >60  GFRAA >60 >60 >60  ANIONGAP 5 5 3*    Recent Labs  Lab 03/28/17 0320  04/01/17 0137 04/02/17 0240 04/03/17 0816  PROT 5.1*  --  5.4*  --  5.7*  ALBUMIN 2.3*   < > 2.3* 2.2* 2.6*  AST 15  --  21  --  16  ALT 11*  --  24  --  20  ALKPHOS 17*  --  27*  --  28*  BILITOT 0.4  --  0.9  --  0.4   < > = values in this interval not displayed.   Hematology Recent Labs  Lab 04/01/17 0137 04/02/17 0240 04/03/17 0816  WBC 13.0* 19.0* 19.5*  RBC 4.37 3.99* 4.35  HGB 12.9* 11.6* 12.8*  HCT 40.0 36.8* 41.2  MCV 91.5 92.2 94.7  MCH 29.5 29.1 29.4  MCHC 32.3 31.5 31.1  RDW 16.8* 17.0* 17.4*  PLT 69* 62* 57*   Cardiac Enzymes Recent Labs  Lab 03/27/17 1622 03/27/17 2118 03/28/17 0320    TROPONINI <0.03 <0.03 <0.03   No results for input(s): TROPIPOC in the last 168 hours.   DDimer  Recent Labs  Lab 03/31/17 0858  DDIMER 0.54*    Radiology/Studies:  Dg Chest Port 1 View  Result Date: 04/03/2017 CLINICAL DATA:  Pneumonia, asthma EXAM: PORTABLE CHEST 1 VIEW COMPARISON:  04/02/2017 FINDINGS: Bilateral perihilar and lower lobe airspace opacities are  again noted, not significantly changed since prior study. Heart is mildly enlarged. Possible small left effusion. No acute bony abnormality. IMPRESSION: No significant change since prior study. Electronically Signed   By: Rolm Baptise M.D.   On: 04/03/2017 10:28   Dg Chest Port 1 View  Result Date: 04/02/2017 CLINICAL DATA:  Pneumonia. EXAM: PORTABLE CHEST 1 VIEW COMPARISON:  Radiograph of March 31, 2017. FINDINGS: Stable cardiomediastinal silhouette. No pneumothorax is noted. Stable bilateral perihilar and basilar opacities are noted concerning for edema or inflammation. Mild left pleural effusion is noted. Bony thorax is unremarkable. IMPRESSION: Stable bilateral perihilar and basilar opacities are noted concerning for pneumonia or edema. Mild left pleural effusion. Electronically Signed   By: Marijo Conception, M.D.   On: 04/02/2017 07:28   Dg Chest Port 1 View  Result Date: 03/31/2017 CLINICAL DATA:  Worsening sepsis, aspiration and pneumonia. EXAM: PORTABLE CHEST 1 VIEW COMPARISON:  03/27/2017 and prior chest radiographs FINDINGS: Patient is mildly rotated. Right mid and lower lung airspace opacities appear slightly increased. Left lower lung airspace disease/ atelectasis again noted. Cardiomediastinal silhouette is unchanged. No pneumothorax or large pleural effusion identified. IMPRESSION: 1. Right mid and lower lung airspace disease/ pneumonia which appears slightly increased. 2. No significant change in left lower lung airspace disease/atelectasis. Electronically Signed   By: Margarette Canada M.D.   On: 03/31/2017 08:43     Assessment and Plan:   1. Shane onset atrial fibrillation/flutter with rapid ventricular rate - Rate in 120-130s on Cardizem gtt 5mg  /hour. Unable to up-titrate due to soft blood pressure. CHADSVASC Score of 1 for HTN. Pending echo. TSH slightly elevated at 7.7 on admission. Seem sepsis and metabolic abnormality are cause of afib. Treat underlying cause with sepsis. My give fluids. Not an anticoagulation candidate. If low EF, change cardizem to metoprolol.   2. Hypertension - OFF BB due to low BP.   3. Recurrent aspiration pneumonia - Per primary team   4. Elevated TSH - Per primary team.    For questions or updates, please contact [place] Please consult [email or web address] for contact info under Cardiology/STEMI.   Jarrett Soho, [place]  04/03/2017 3:51 PM   Attending Note:   The patient was seen and examined.  Agree with assessment and plan as noted above.  Changes made to the above note as needed.  Patient seen and independently examined with Vin bhagat, PA .   We discussed all aspects of the encounter. I agree with the assessment and plan as stated above.  1,.   Atrial fib:   Likely secondary to underlying infection.   He likely has aspiration pneumonia .  HR is only moderatly elevated.   He is tolerating Dilt drip 5 mg / Hr . Echo to be done tomorrow .    I would concentrate on rate control.  Given his severe mental limitations, I would not consider him a candidate for anticoagulation.  I think that the risks are too great.  2.  Recurrent aspiration pneumonia: Plan per internal medicine team.      I have spent a total of 40 minutes with patient reviewing hospital  notes , telemetry, EKGs, labs and examining patient as well as establishing an assessment and plan that was discussed with the patient. > 50% of time was spent in direct patient care.    Thayer Headings, Brooke Bonito., MD, [facility] 04/03/2017, 5:40 PM [address],  [place] Pager [phone]

## 2017-04-03 NOTE — Progress Notes (Signed)
ekg and strips showing flutter 130-140 range D/c meteprolol, echo, cardzem gtt and will ask cardiology for opinion

## 2017-04-03 NOTE — Progress Notes (Signed)
Pt HR in the 130s-140s on cardizem gtt at 42ml/hr. RN unable to titrate drip up d/t SBP being in the 90s-100s. Verlon Au, MD notified of pt status. Waiting on cards consult. RN will continue to monitor.

## 2017-04-04 ENCOUNTER — Inpatient Hospital Stay (HOSPITAL_COMMUNITY): Payer: Medicare Other

## 2017-04-04 DIAGNOSIS — I371 Nonrheumatic pulmonary valve insufficiency: Secondary | ICD-10-CM

## 2017-04-04 LAB — GLUCOSE, CAPILLARY
GLUCOSE-CAPILLARY: 111 mg/dL — AB (ref 65–99)
GLUCOSE-CAPILLARY: 116 mg/dL — AB (ref 65–99)
Glucose-Capillary: 102 mg/dL — ABNORMAL HIGH (ref 65–99)
Glucose-Capillary: 103 mg/dL — ABNORMAL HIGH (ref 65–99)
Glucose-Capillary: 108 mg/dL — ABNORMAL HIGH (ref 65–99)
Glucose-Capillary: 140 mg/dL — ABNORMAL HIGH (ref 65–99)

## 2017-04-04 LAB — CBC WITH DIFFERENTIAL/PLATELET
BASOS ABS: 0 10*3/uL (ref 0.0–0.1)
Basophils Relative: 0 %
EOS PCT: 0 %
Eosinophils Absolute: 0 10*3/uL (ref 0.0–0.7)
HEMATOCRIT: 37.7 % — AB (ref 39.0–52.0)
HEMOGLOBIN: 11.5 g/dL — AB (ref 13.0–17.0)
LYMPHS ABS: 1.4 10*3/uL (ref 0.7–4.0)
LYMPHS PCT: 8 %
MCH: 28.8 pg (ref 26.0–34.0)
MCHC: 30.5 g/dL (ref 30.0–36.0)
MCV: 94.5 fL (ref 78.0–100.0)
Monocytes Absolute: 1.7 10*3/uL — ABNORMAL HIGH (ref 0.1–1.0)
Monocytes Relative: 9 %
NEUTROS ABS: 14.6 10*3/uL — AB (ref 1.7–7.7)
Neutrophils Relative %: 83 %
PLATELETS: 52 10*3/uL — AB (ref 150–400)
RBC: 3.99 MIL/uL — AB (ref 4.22–5.81)
RDW: 17.3 % — ABNORMAL HIGH (ref 11.5–15.5)
WBC: 17.7 10*3/uL — ABNORMAL HIGH (ref 4.0–10.5)

## 2017-04-04 LAB — ECHOCARDIOGRAM COMPLETE
CHL CUP PV REG GRAD DIAS: 7 mmHg
CHL CUP REG VEL DIAS: 133 cm/s
EERAT: 6.24
EWDT: 261 ms
FS: 51 % — AB (ref 28–44)
Height: 69 in
IVS/LV PW RATIO, ED: 1.19
LA ID, A-P, ES: 36 mm
LA diam end sys: 36 mm
LA diam index: 1.85 cm/m2
LA vol A4C: 56.3 ml
LA vol index: 28.1 mL/m2
LAVOL: 54.6 mL
LVEEAVG: 6.24
LVEEMED: 6.24
LVELAT: 14 cm/s
LVOT area: 3.8 cm2
LVOT diameter: 22 mm
MV Dec: 261
MVPG: 3 mmHg
MVPKAVEL: 104 m/s
MVPKEVEL: 87.3 m/s
PW: 7.15 mm — AB (ref 0.6–1.1)
RV LATERAL S' VELOCITY: 14.9 cm/s
RV TAPSE: 24 mm
Reg peak vel: 252 cm/s
TDI e' lateral: 14
TDI e' medial: 7.29
TRMAXVEL: 252 cm/s
Weight: 2716.07 oz

## 2017-04-04 LAB — RENAL FUNCTION PANEL
ANION GAP: 3 — AB (ref 5–15)
Albumin: 2.2 g/dL — ABNORMAL LOW (ref 3.5–5.0)
BUN: 7 mg/dL (ref 6–20)
CHLORIDE: 120 mmol/L — AB (ref 101–111)
CO2: 30 mmol/L (ref 22–32)
Calcium: 9.4 mg/dL (ref 8.9–10.3)
Creatinine, Ser: 1.09 mg/dL (ref 0.61–1.24)
Glucose, Bld: 104 mg/dL — ABNORMAL HIGH (ref 65–99)
Phosphorus: 3.6 mg/dL (ref 2.5–4.6)
Potassium: 3.4 mmol/L — ABNORMAL LOW (ref 3.5–5.1)
Sodium: 153 mmol/L — ABNORMAL HIGH (ref 135–145)

## 2017-04-04 LAB — C-PEPTIDE: C-Peptide: 5.6 ng/mL — ABNORMAL HIGH (ref 1.1–4.4)

## 2017-04-04 MED ORDER — DILTIAZEM HCL 100 MG IV SOLR: 5.0000 mg/h | INTRAVENOUS | Status: DC

## 2017-04-04 MED ORDER — DILTIAZEM HCL ER COATED BEADS 180 MG PO CP24
180.0000 mg | ORAL_CAPSULE | Freq: Every day | ORAL | Status: DC
  Administered 2017-04-04 – 2017-04-06 (×3): 180 mg via ORAL
  Filled 2017-04-04 (×4): qty 1

## 2017-04-04 MED ORDER — RISPERIDONE MICROSPHERES 50 MG IM SUSR
50.0000 mg | INTRAMUSCULAR | Status: DC
  Filled 2017-04-04: qty 2

## 2017-04-04 NOTE — Progress Notes (Signed)
  Speech Language Pathology Treatment: Dysphagia  Patient Details Name: Shane Gentry MRN: [id number] DOB: [birth year] Today's Date: 04/04/2017 Time: 3086-5784 SLP Time Calculation (min) (ACUTE ONLY): 21 min  Assessment / Plan / Recommendation Clinical Impression  Much improved affect from yesterday; not tacycardic. Pt's foley had malfunctioned and bladder scan revealed significant retention this morning. No oral pocketing present today, timely transfer. MBS 11/20 recommended thin liquids which was initiated with subsequent suspected aspiration episode and transferred to ICU. This therapist placed pt on thick liquids after wet vocal quality and significant audible congestion noted. Today, observed him with trials thin water and cherry icy without s/s aspiration. Recommend repeat MBS tomorrow for possible liquid upgrade. Continue with Dys 2 and downgrade liquid to nectar.    HPI HPI: 41 year old male living in a group home with a history of ADD, anxiety, asthma, depression, schizoaffective disorder from a group home who presented to the ED with AMS and lethargy. ED visit on 11/16 for evaluation of urinary frequency and being off balance.CT no acute abnormality and pt discharged. Chest x-ray revealed patchy bibasilar airspace opacification is worrisome for pneumonia.       SLP Plan  MBS       Recommendations  Diet recommendations: Nectar-thick liquid;Dysphagia 2 (fine chop) Liquids provided via: Cup;No straw Medication Administration: Crushed with puree Supervision: Patient able to self feed;Full supervision/cueing for compensatory strategies Compensations: Minimize environmental distractions;Slow rate;Small sips/bites;Lingual sweep for clearance of pocketing;Monitor for anterior loss Postural Changes and/or Swallow Maneuvers: Seated upright 90 degrees                Oral Care Recommendations: Oral care BID Follow up Recommendations: 24 hour supervision/assistance SLP Visit  Diagnosis: Dysphagia, oral phase (R13.11) Plan: MBS       GO                [name] 04/04/2017, 1:13 PM   [name] [employer] [phone]

## 2017-04-04 NOTE — Progress Notes (Signed)
  Echocardiogram 2D Echocardiogram has been performed.  [place] Marchiano G Judit Awad 04/04/2017, 11:56 AM

## 2017-04-04 NOTE — Progress Notes (Addendum)
Progress Note  Patient Name: Shane Gentry Date of Encounter: 04/04/2017  Primary Cardiologist: New to [facility]   Subjective   41 yo with hx of schizoaffective disorders Found to have chronic aspiration pneumonia.  He was recently found to have atrial flutter rapid ventricular response. Is able to tolerate only low doses of Cardizem drip.  Has converted back into normal sinus rhythm today.  He is feeling better.   Inpatient Medications    Scheduled Meds: . amoxicillin-clavulanate  1 tablet Oral Q12H  . benzonatate  100 mg Oral Q8H  . divalproex  1,000 mg Oral QHS  . free water  200 mL Oral Q8H  . heparin  5,000 Units Subcutaneous Q8H  . hydrocortisone sod succinate (SOLU-CORTEF) inj  50 mg Intravenous Q12H  . LORazepam  2 mg Oral BID  . montelukast  10 mg Oral QHS  . temazepam  30 mg Oral QHS   Continuous Infusions: . dextrose 125 mL/hr at 04/04/17 0040  . diltiazem (CARDIZEM) infusion 5 mg/hr (04/04/17 0810)   PRN Meds: levalbuterol, [DISCONTINUED] ondansetron **OR** ondansetron (ZOFRAN) IV, RESOURCE THICKENUP CLEAR   Vital Signs    Vitals:   04/03/17 2025 04/03/17 2335 04/04/17 0344 04/04/17 0734  BP: 112/65 95/62 107/67 119/80  Pulse: 94 70 80 78  Resp: (!) 25 16 15 20   Temp: 99.1 F (37.3 C) 98.4 F (36.9 C) 98.4 F (36.9 C) 97.7 F (36.5 C)  TempSrc: Oral Axillary Oral Oral  SpO2: 98% 96% 91% 98%  Weight:   169 lb 12.1 oz (77 kg)   Height:        Intake/Output Summary (Last 24 hours) at 04/04/2017 1048 Last data filed at 04/04/2017 1308 Gross per 24 hour  Intake 2930 ml  Output 5300 ml  Net -2370 ml   Filed Weights   03/29/17 2013 04/03/17 0320 04/04/17 0344  Weight: 154 lb 5.2 oz (70 kg) 175 lb 7.8 oz (79.6 kg) 169 lb 12.1 oz (77 kg)    Telemetry    Normal sinus rhythm- Personally Reviewed  ECG    Sinus rhythm- Personally Reviewed  Physical Exam   GEN: middle age black male.  Minimally comminicative  Neck: No JVD Cardiac: RRR  soft murmur , rubs, or gallops.  Respiratory: Clear to auscultation bilaterally. GI: Soft, nontender, non-distended  MS: No edema; No deformity. Neuro:  Nonfocal  Psych: Normal affect   Labs    Chemistry Recent Labs  Lab 04/01/17 0137 04/02/17 0240 04/03/17 0816 04/03/17 1345 04/04/17 0225  NA 147* 148* 155* 152* 153*  K 4.1 4.0 3.6 3.4* 3.4*  CL 118* 119* 119* 119* 120*  CO2 25 24 31 30 30   GLUCOSE 107* 100* 78 99 104*  BUN 13 13 10 9 7   CREATININE 1.45* 1.28* 1.18 1.21 1.09  CALCIUM 9.4 9.1 9.8 9.7 9.4  PROT 5.4*  --  5.7*  --   --   ALBUMIN 2.3* 2.2* 2.6*  --  2.2*  AST 21  --  16  --   --   ALT 24  --  20  --   --   ALKPHOS 27*  --  28*  --   --   BILITOT 0.9  --  0.4  --   --   GFRNONAA 58* >60 >60 >60 >60  GFRAA >60 >60 >60 >60 >60  ANIONGAP 4* 5 5 3* 3*     Hematology Recent Labs  Lab 04/02/17 0240 04/03/17 0816 04/04/17 0225  WBC 19.0*  19.5* 17.7*  RBC 3.99* 4.35 3.99*  HGB 11.6* 12.8* 11.5*  HCT 36.8* 41.2 37.7*  MCV 92.2 94.7 94.5  MCH 29.1 29.4 28.8  MCHC 31.5 31.1 30.5  RDW 17.0* 17.4* 17.3*  PLT 62* 57* 52*    Cardiac EnzymesNo results for input(s): TROPONINI in the last 168 hours. No results for input(s): TROPIPOC in the last 168 hours.   BNPNo results for input(s): BNP, PROBNP in the last 168 hours.   DDimer  Recent Labs  Lab 03/31/17 0858  DDIMER 0.54*     Radiology    Dg Chest Port 1 View  Result Date: 04/03/2017 CLINICAL DATA:  Pneumonia, asthma EXAM: PORTABLE CHEST 1 VIEW COMPARISON:  04/02/2017 FINDINGS: Bilateral perihilar and lower lobe airspace opacities are again noted, not significantly changed since prior study. Heart is mildly enlarged. Possible small left effusion. No acute bony abnormality. IMPRESSION: No significant change since prior study. Electronically Signed   By: Rolm Baptise M.D.   On: 04/03/2017 10:28    Cardiac Studies     Patient Profile     41 y.o. male with significant mental impairment.  He has  chronic aspiration.  He lives in a group home.  Incidentally noted to have rapid atrial flutter.  Assessment & Plan    .  Atrial flutter: She has atrial flutter likely related to his chronic aspiration and aspiration pneumonia.   He has converted to normal sinus rhythm after antibiotic therapy for his aspiration pneumonia.  He is currently on diltiazem.  We will continue with diltiazem but change it to oral diltiazem.  Echocardiogram shows normal left ventricular systolic function with ejection fraction of 65-70%.  There is grade 1 diastolic dysfunction.  At this point I do not think that we need the addition of amiodarone.  As mentioned before, I do not think that he is a candidate for anticoagulation.    For questions or updates, please contact [place] Please consult [email or web address] for contact info under Cardiology/STEMI.      Signed, Mertie Moores, MD  04/04/2017, 10:48 AM

## 2017-04-04 NOTE — Progress Notes (Signed)
Per SLP, patient can eat italian ice popsicles with supervision.

## 2017-04-04 NOTE — Progress Notes (Signed)
PROGRESS NOTE    Shane Gentry  EAV:[id number] DOB: [birth year] DOA: 03/27/2017 PCP: Shane Noble, MD   Specialists:     Brief Narrative:   41 year old long-term resident of group home Prior SBO 09/25/2015 hospitalized for the same Failure to thrive Hypothyroidism Schizoaffective disorder + MR Prior dysphagia and weight loss probably secondary to severe chronic constipation  Presented from group home with altered mental status Coughing day of admission On arrival 11/19 to emergency room core temperature of 88 patient was given warm fluids then chest x-ray revealed bilateral infiltrates suggestive of pneumonia glucose was 60 patient received 2-1/2 L normal saline for presumed aspiration pneumonia Labs on admission showed sodium 147 BUN/creatinine 14/1.2 LFTs normal Platelets 4  Was admitted but had recurrent aspiration and had to go to the ICU on 11/23 however has improved But currently now back on IV antibiotics despite trying to take him off and is still hypo-glycemic so needing further management  Assessment & Plan:   Principal Problem:   Aspiration pneumonitis ([place]) Active Problems:   Schizoaffective disorder, depressive type (Shane Gentry)   Failure to thrive in adult   Hyperthyroidism   Sepsis secondary to UTI (Shane Gentry)   Sepsis (Shane Gentry)   Recurrent aspiration pneumonia SLP has eva speech therapy evaluated recommend DYS 2 thin liquid---he probably aspirated again evening 11/22 Was de-escalated to po levaquin 11/22 d vanc zosyn 11/23-tapered to Augmentin 11/25 again and monitor effect Bc x 2 NGTD, pct lower Resume solu cortef 100 q8-->50 q12 11/25   P Aflutter Monitoring on SDU HR stabilizing on Cardizem gtt--nclear what was precipitant Poor candidate for [facility] agreed Defer to cardiology addition amio or just monotherapy cardizem--echo is pending  Continued hypoglycemia Etiology is unclear Because of his persistent hypoglycemia we will rule out rare causes such as  insulinoma nursing order has been placed for insulin level, C-peptide, beta hydroxybutyrate and proinsulin levels Sugars more stable in 100 range  Hypoxic resp failure Probably 2/2 pna--slightly exacerbated and now requiring nasal oxygen and sats above 88% CXR 11/25 shows fluid infection Repeat chest x-ray 1 view 11/26 with unchanged infiltrates suggestive of PNA   Met encephalopathy 2/2 to sepsis vs hypoglycemia Fluid now just d5-see above discussion  Hypothyroidism TSH slightly elevated at 7.7-this is a reported illness but I do not see any meds for the same will monitor and adjust if needed  Affective disorder, MR reported Resuming home meds 11/24 Risperdal Consta 50 Q 14--ordered for 11/29 temazepam 30 at bedtime, Depakote 1000 at bedtime lithium 1 capsule at bedtime,  lorazepam 2 mg twice daily  HTN holding metoprolol XL 25 as still hypotensive  chr kidney disease stage II-III baseline creatinine anywhere from 1.0-1.2 currently 1.45 ?nephrogenic induced Lithium DI?--unlikely lithium levels wnl Changing fluids as above 11/24 BUN/creatinine better but still hypernatremic Will give free water D5 125 cc/h -rpt bmet Hold lithium for the time being until hypernatremia resolved Forcing oral fluids as well 200 q8  Hyperlipidemia continue atorvastatin 20 every afternoon  Chronic severe constipation continue MiraLAX 17 g daily ensure proper bowel regimen  TCP unclear cause Appears to have been seen at [facility] oncology in 2010 but no information currently available at this time DIC panel is negative think this is probably just medication related  presumed full code SDU Inpatient CALLED AND D/W [facility] HCPOA but no answer  Consultants:   None  Procedures:   None  Antimicrobials:   Vancomycin, Zosyn 11/19   Subjective:  Awake alert ang agitated in nad now Eating  only minimally Having some pain in groin-catheter seems to have been clogged No cp Tele shows rte  controlled aflut No fever no chills     Objective: Vitals:   04/03/17 2025 04/03/17 2335 04/04/17 0344 04/04/17 0734  BP: 112/65 95/62 107/67 119/80  Pulse: 94 70 80 78  Resp: (!) 25 16 15 20   Temp: 99.1 F (37.3 C) 98.4 F (36.9 C) 98.4 F (36.9 C) 97.7 F (36.5 C)  TempSrc: Oral Axillary Oral Oral  SpO2: 98% 96% 91% 98%  Weight:   77 kg (169 lb 12.1 oz)   Height:        Intake/Output Summary (Last 24 hours) at 04/04/2017 1205 Last data filed at 04/04/2017 1100 Gross per 24 hour  Intake 2960 ml  Output 4550 ml  Net -1590 ml   Filed Weights   03/29/17 2013 04/03/17 0320 04/04/17 0344  Weight: 70 kg (154 lb 5.2 oz) 79.6 kg (175 lb 7.8 oz) 77 kg (169 lb 12.1 oz)    Examination:  Awake alert Poor dentition S1-S2 no murmur rub or gallop-tele reviewed Chest overall clear--poor exam Abdomen soft nontender nondistended Following commands Unable to have much meaningful interaction with him  Data Reviewed: I have personally reviewed following labs and imaging studies  CBC: Recent Labs  Lab 03/31/17 0619 03/31/17 0858 04/01/17 0137 04/02/17 0240 04/03/17 0816 04/04/17 0225  WBC 8.6  --  13.0* 19.0* 19.5* 17.7*  NEUTROABS 5.6  --  11.3* 16.6* 15.5* 14.6*  HGB 12.1*  --  12.9* 11.6* 12.8* 11.5*  HCT 38.6*  --  40.0 36.8* 41.2 37.7*  MCV 93.2  --  91.5 92.2 94.7 94.5  PLT 56* 58* 69* 62* 57* 52*   Basic Metabolic Panel: Recent Labs  Lab 03/29/17 0317 03/30/17 0852 03/31/17 0618 04/01/17 0137 04/02/17 0240 04/02/17 1232 04/03/17 0816 04/03/17 1345 04/04/17 0225  NA 145 146* 146* 147* 148*  --  155* 152* 153*  K 3.7 3.5 2.9* 4.1 4.0  --  3.6 3.4* 3.4*  CL 120* 119* 117* 118* 119*  --  119* 119* 120*  CO2 20* 21* 24 25 24   --  31 30 30   GLUCOSE 112* 82 102* 107* 100*  --  78 99 104*  BUN 14 15 13 13 13   --  10 9 7   CREATININE 1.31* 1.25* 1.21 1.45* 1.28*  --  1.18 1.21 1.09  CALCIUM 9.0 9.3 9.0 9.4 9.1  --  9.8 9.7 9.4  MG  --   --   --   --   --   2.3 2.1  --   --   PHOS 2.5 3.2 2.9  --  3.2  --   --   --  3.6   GFR: Estimated Creatinine Clearance: 87.4 mL/min (by C-G formula based on SCr of 1.09 mg/dL). Liver Function Tests: Recent Labs  Lab 03/31/17 0618 04/01/17 0137 04/02/17 0240 04/03/17 0816 04/04/17 0225  AST  --  21  --  16  --   ALT  --  24  --  20  --   ALKPHOS  --  27*  --  28*  --   BILITOT  --  0.9  --  0.4  --   PROT  --  5.4*  --  5.7*  --   ALBUMIN 2.2* 2.3* 2.2* 2.6* 2.2*   No results for input(s): LIPASE, AMYLASE in the last 168 hours. No results for input(s): AMMONIA in the last 168 hours. Coagulation Profile:  Recent Labs  Lab 03/31/17 0858  INR 0.97   Cardiac Enzymes: No results for input(s): CKTOTAL, CKMB, CKMBINDEX, TROPONINI in the last 168 hours. BNP (last 3 results) No results for input(s): PROBNP in the last 8760 hours. HbA1C: No results for input(s): HGBA1C in the last 72 hours. CBG: Recent Labs  Lab 04/03/17 1557 04/03/17 2024 04/03/17 2342 04/04/17 0343 04/04/17 0840  GLUCAP 107* 167* 87 102* 111*   Lipid Profile: No results for input(s): CHOL, HDL, LDLCALC, TRIG, CHOLHDL, LDLDIRECT in the last 72 hours. Thyroid Function Tests: No results for input(s): TSH, T4TOTAL, FREET4, T3FREE, THYROIDAB in the last 72 hours. Anemia Panel: No results for input(s): VITAMINB12, FOLATE, FERRITIN, TIBC, IRON, RETICCTPCT in the last 72 hours. Urine analysis:    Component Value Date/Time   COLORURINE YELLOW 03/27/2017 2226   APPEARANCEUR CLEAR 03/27/2017 2226   APPEARANCEUR Clear 02/04/2015 0940   LABSPEC 1.011 03/27/2017 2226   PHURINE 5.0 03/27/2017 2226   GLUCOSEU NEGATIVE 03/27/2017 2226   HGBUR NEGATIVE 03/27/2017 2226   BILIRUBINUR NEGATIVE 03/27/2017 2226   BILIRUBINUR Negative 02/04/2015 0940   KETONESUR NEGATIVE 03/27/2017 2226   PROTEINUR NEGATIVE 03/27/2017 2226   UROBILINOGEN 1.0 02/04/2015 1631   NITRITE NEGATIVE 03/27/2017 2226   LEUKOCYTESUR NEGATIVE 03/27/2017 2226    LEUKOCYTESUR Negative 02/04/2015 0940     Radiology Studies: Reviewed images personally in health database    Scheduled Meds: . amoxicillin-clavulanate  1 tablet Oral Q12H  . benzonatate  100 mg Oral Q8H  . divalproex  1,000 mg Oral QHS  . free water  200 mL Oral Q8H  . heparin  5,000 Units Subcutaneous Q8H  . hydrocortisone sod succinate (SOLU-CORTEF) inj  50 mg Intravenous Q12H  . LORazepam  2 mg Oral BID  . montelukast  10 mg Oral QHS  . [START ON 04/06/2017] risperiDONE microspheres  50 mg Intramuscular Q14 Days  . temazepam  30 mg Oral QHS   Continuous Infusions: . dextrose 125 mL/hr at 04/04/17 0040  . diltiazem (CARDIZEM) infusion 5 mg/hr (04/04/17 0810)     LOS: 8 days    Time spent: [place], MD [facility]   If 7PM-7AM, please contact night-coverage [email or web address] Password [facility] 04/04/2017, 12:05 PM

## 2017-04-04 NOTE — Progress Notes (Signed)
Patient complaining of abdominal pain, stating he "has to pee". Bladder scan performed showing 731mL, despite having foley. Foley removed and patient urinated upon removal. Condom catheter placed, will continue to monitor for urine retention.

## 2017-04-05 ENCOUNTER — Inpatient Hospital Stay (HOSPITAL_COMMUNITY): Payer: Medicare Other

## 2017-04-05 DIAGNOSIS — J690 Pneumonitis due to inhalation of food and vomit: Secondary | ICD-10-CM | POA: Diagnosis present

## 2017-04-05 DIAGNOSIS — F251 Schizoaffective disorder, depressive type: Secondary | ICD-10-CM

## 2017-04-05 DIAGNOSIS — T68XXXA Hypothermia, initial encounter: Secondary | ICD-10-CM

## 2017-04-05 DIAGNOSIS — I959 Hypotension, unspecified: Secondary | ICD-10-CM

## 2017-04-05 LAB — CBC WITH DIFFERENTIAL/PLATELET
BASOS ABS: 0 10*3/uL (ref 0.0–0.1)
BASOS PCT: 0 %
EOS ABS: 0.1 10*3/uL (ref 0.0–0.7)
EOS PCT: 1 %
HCT: 37.1 % — ABNORMAL LOW (ref 39.0–52.0)
Hemoglobin: 11.4 g/dL — ABNORMAL LOW (ref 13.0–17.0)
Lymphocytes Relative: 15 %
Lymphs Abs: 2 10*3/uL (ref 0.7–4.0)
MCH: 29.3 pg (ref 26.0–34.0)
MCHC: 30.7 g/dL (ref 30.0–36.0)
MCV: 95.4 fL (ref 78.0–100.0)
MONO ABS: 1.8 10*3/uL — AB (ref 0.1–1.0)
MONOS PCT: 14 %
Neutro Abs: 9.6 10*3/uL — ABNORMAL HIGH (ref 1.7–7.7)
Neutrophils Relative %: 71 %
PLATELETS: 62 10*3/uL — AB (ref 150–400)
RBC: 3.89 MIL/uL — ABNORMAL LOW (ref 4.22–5.81)
RDW: 16.9 % — AB (ref 11.5–15.5)
WBC: 13.5 10*3/uL — ABNORMAL HIGH (ref 4.0–10.5)

## 2017-04-05 LAB — RENAL FUNCTION PANEL
ALBUMIN: 2.3 g/dL — AB (ref 3.5–5.0)
ANION GAP: 4 — AB (ref 5–15)
BUN: 6 mg/dL (ref 6–20)
CO2: 31 mmol/L (ref 22–32)
CREATININE: 1.11 mg/dL (ref 0.61–1.24)
Calcium: 9.3 mg/dL (ref 8.9–10.3)
Chloride: 118 mmol/L — ABNORMAL HIGH (ref 101–111)
GFR calc Af Amer: >60 mL/min (ref >60)
GFR calc non Af Amer: >60 mL/min (ref >60)
GLUCOSE: 103 mg/dL — AB (ref 65–99)
PHOSPHORUS: 2.9 mg/dL (ref 2.5–4.6)
Potassium: 3.4 mmol/L — ABNORMAL LOW (ref 3.5–5.1)
Sodium: 153 mmol/L — ABNORMAL HIGH (ref 135–145)

## 2017-04-05 LAB — CULTURE, BLOOD (ROUTINE X 2)
CULTURE: NO GROWTH
CULTURE: NO GROWTH
SPECIAL REQUESTS: ADEQUATE
Special Requests: ADEQUATE

## 2017-04-05 LAB — GLUCOSE, CAPILLARY
GLUCOSE-CAPILLARY: 139 mg/dL — AB (ref 65–99)
GLUCOSE-CAPILLARY: 119 mg/dL — AB (ref 65–99)
Glucose-Capillary: 129 mg/dL — ABNORMAL HIGH (ref 65–99)
Glucose-Capillary: 84 mg/dL (ref 65–99)
Glucose-Capillary: 88 mg/dL (ref 65–99)

## 2017-04-05 NOTE — Plan of Care (Signed)
Pt is able to reposition self when needed.

## 2017-04-05 NOTE — Progress Notes (Signed)
Modified Barium Swallow Progress Note  Patient Details  Name: Shane Gentry MRN: [id number] Date of Birth: Mar 11, 1976  Today's Date: 04/05/2017  Modified Barium Swallow completed.  Full report located under Chart Review in the Imaging Section.  Brief recommendations include the following:  Clinical Impression  Pt exhibited similar oropharyngeal swallow ability as prior MBS 11/201/18 although distratcted first half study with need to urinate. As noted prior, when he is internally distracted by pain/discomfort, pt demonstrates oral holding, transit delays, head extention and spastic muscle response due to internal distractions. Study paused; pt attempted to use urinal with subsequent improvements in pt's focus/attention and increase in oral transit time. Pharyngeal phase marked by intermittent swallow initiation at his pyriform sinsuses completely filling before swallow triggered. Min-mild vallecular/pyriform sinsus residue beginning to spill from pyriform sinus before initiating swallow. No significant barium observed in vestibule or trachea during study although pt's oral impairments, cognition and waxing/waning episodes of pain/inattention increase aspiration risk, therefore recommend pt to remain on nectar thick liquids and Dys 2 texture with Speech Therapy follow up via home health once back at group home.    Swallow Evaluation Recommendations       SLP Diet Recommendations: Dysphagia 2 (Fine chop) solids;Nectar thick liquid   Liquid Administration via: Straw;Cup   Medication Administration: Crushed with puree   Supervision: Full supervision/cueing for compensatory strategies;Staff to assist with self feeding   Compensations: Minimize environmental distractions;Slow rate;Small sips/bites;Clear throat intermittently   Postural Changes: Seated upright at 90 degrees   Oral Care Recommendations: Oral care BID        [name] 04/05/2017,10:18 AM   [name]  [name] [employer] [phone]

## 2017-04-05 NOTE — Progress Notes (Signed)
Pt w/episode of epistaxis; able to be stopped w/pressure applied/packing in the right nares..  No other s/sx of discomfort.  Will continue to monitor.

## 2017-04-05 NOTE — Progress Notes (Signed)
PROGRESS NOTE  Shane Gentry VVZ:[id number] DOB: 1975/05/24 DOA: 03/27/2017 PCP: [name], MD  HPI/Recap of past 72 hours: 41 year old long-term resident of group home with hx of prior SBO, HTN, HLD, PAF, failure to thrive, hypothyroidism, schizoaffective disorder + MR, prior dysphagia, severe chronic constipation, presented from group home with altered mental status, cough. On arrival 11/19 to emergency room core temperature of 88 patient was given warm fluids then chest x-ray revealed bilateral infiltrates suggestive of pneumonia, glucose was 60 patient received 2-1/2 L normal saline for presumed aspiration pneumonia. Pt had recurrent aspiration and had to go to the ICU on 11/23, however has improved but currently still on AB.  Today, pt reported feeling well. Reports an episode of epistaxis which was immediately stopped after nose was packed. Pt denies any chest pain, SOB, abdominal pain, N/V/D/C, fever/chills.   Assessment/Plan: Principal Problem:   Aspiration pneumonitis (HCC) Active Problems:   Schizoaffective disorder, depressive type ([place])   Failure to thrive in adult   Hyperthyroidism   Sepsis secondary to UTI ([place])   Sepsis ([place])   Aspiration pneumonia ([place])  #Recurrent aspiration pneumonia Improving Afebrile, resolving leukocytosis SLP has eva speech therapy evaluated recommend DYS 2 thin liquid---he probably aspirated again evening 11/22 Was de-escalated to po levaquin 11/22 d vanc zosyn 11/23-tapered to Augmentin 11/25 again and monitor effect Bc x 2 NGTD, pct lower Resume solu cortef 100 q8-->50 q12 11/25   #P Aflutter Controlled S/P Cardizem gtt Poor candidate for [facility] agreed ECHO: LVEF 65-70%, Grade 1DD Cardiology on board: Continue PO diltiazem, not a candidate for [facility]  #Persistent hypoglycemia Improving Etiology is unclear Because of his persistent hypoglycemia we will rule out rare causes such as insulinoma nursing order has been placed for  insulin level, C-peptide, beta-hydroxybutyrate and proinsulin levels Follow up as an outpt  #Acute hypoxic resp failure Resolving Probably 2/2 pna--slightly exacerbated and now requiring nasal oxygen and sats above 88% CXR 11/25 shows fluid infection Repeat chest x-ray 1 view 11/26 with unchanged infiltrates suggestive of PNA   #Met encephalopathy Resolving 2/2 to sepsis vs hypoglycemia Fluid now just d5-see above discussion  #Hypothyroidism TSH slightly elevated at 7.7, likely due to acute illness Outpt follow up required  #Affective disorder, MR reported Stable Resuming home meds 11/24 Risperdal Consta 50 Q 14--ordered for 11/29 temazepam 30 at bedtime, Depakote 1000 at bedtime lithium 1 capsule at bedtime,  lorazepam 2 mg twice daily  #HTN holding metoprolol XL 25 due to soft BP  #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  Resolved, baseline creatinine anywhere from 1.0-1.2 ?nephrogenic induced Lithium DI?--unlikely lithium levels wnl BUN/creatinine better but still hypernatremic Daily BMP  #Hypernatremia Na ~ 153 Continue free water D5W 125cc/hr Encouraging oral fluids as well 200 q8H Hold lithium for the time being until hypernatremia resolved  #Hyperlipidemia  Continue atorvastatin 20  #Chronic severe constipation  Continue MiraLAX 17 g daily ensure proper bowel regimen  #Thrombocytopenia, unclear cause Appears to have been seen at [facility] oncology in 2010 but no information currently available at this time DIC panel is negative think this is probably just medication related    Code Status: Full  Family Communication: None at bedside  Disposition Plan: Possibly back to NH by 04/06/17   Consultants:  None  Procedures:  None   Antimicrobials:  PO Augmentin  DVT prophylaxis:  Heparin [place]   Objective: Vitals:   04/05/17 0300 04/05/17 0341 04/05/17 0757 04/05/17 1127  BP:  115/71 (!) 131/91 109/64  Pulse:  90 69 (!) 102   Resp: _0 Temp:  98.3 F (36.8 C) 98.6 F (37 C) 98.3 F (36.8 C)  TempSrc:  Oral Oral Oral  SpO2: 97% 96% 98%   Weight:  75.8 kg (167 lb 1.7 oz)    Height:        Intake/Output Summary (Last 24 hours) at 04/05/2017 1508 Last data filed at 04/05/2017 1255 Gross per 24 hour  Intake 2965.67 ml  Output 2800 ml  Net 165.67 ml   Filed Weights   04/03/17 0320 04/04/17 0344 04/05/17 0341  Weight: 79.6 kg (175 lb 7.8 oz) 77 kg (169 lb 12.1 oz) 75.8 kg (167 lb 1.7 oz)    Exam:   General: alert, awake, oriented to self  Cardiovascular: S1-S2 present, no added hrt sound  Respiratory: Chest clear bilaterally  Abdomen: Soft, non tender, non-distended, BS present  Musculoskeletal: No bilateral pedal edema  Skin: Normal  Psychiatry: Unable to assess    Data Reviewed: CBC: Recent Labs  Lab 04/01/17 0137 04/02/17 0240 04/03/17 0816 04/04/17 0225 04/05/17 0243  WBC 13.0* 19.0* 19.5* 17.7* 13.5*  NEUTROABS 11.3* 16.6* 15.5* 14.6* 9.6*  HGB 12.9* 11.6* 12.8* 11.5* 11.4*  HCT 40.0 36.8* 41.2 37.7* 37.1*  MCV 91.5 92.2 94.7 94.5 95.4  PLT 69* 62* 57* 52* 62*   Basic Metabolic Panel: Recent Labs  Lab 03/30/17 0852 03/31/17 0618  04/02/17 0240 04/02/17 1232 04/03/17 0816 04/03/17 1345 04/04/17 0225 04/05/17 0243  NA 146* 146*   < > 148*  --  155* 152* 153* 153*  K 3.5 2.9*   < > 4.0  --  3.6 3.4* 3.4* 3.4*  CL 119* 117*   < > 119*  --  119* 119* 120* 118*  CO2 21* 24   < > 24  --  _1 GLUCOSE 82 102*   < > 100*  --  78 99 104* 103*  BUN 15 13   < > 13  --  _2 CREATININE 1.25* 1.21   < > 1.28*  --  1.18 1.21 1.09 1.11  CALCIUM 9.3 9.0   < > 9.1  --  9.8 9.7 9.4 9.3  MG  --   --   --   --  2.3 2.1  --   --   --   PHOS 3.2 2.9  --  3.2  --   --   --  3.6 2.9   < > = values in this interval not displayed.   GFR: Estimated Creatinine Clearance: 85.8 mL/min (by C-G formula based on SCr of 1.11 mg/dL). Liver Function Tests: Recent Labs  Lab  04/01/17 0137 04/02/17 0240 04/03/17 0816 04/04/17 0225 04/05/17 0243  AST 21  --  16  --   --   ALT 24  --  20  --   --   ALKPHOS 27*  --  28*  --   --   BILITOT 0.9  --  0.4  --   --   PROT 5.4*  --  5.7*  --   --   ALBUMIN 2.3* 2.2* 2.6* 2.2* 2.3*   No results for input(s): LIPASE, AMYLASE in the last 168 hours. No results for input(s): AMMONIA in the last 168 hours. Coagulation Profile: Recent Labs  Lab 03/31/17 0858  INR 0.97   Cardiac Enzymes: No results for input(s): CKTOTAL, CKMB, CKMBINDEX, TROPONINI in the last 168 hours. BNP (last 3  results) No results for input(s): PROBNP in the last 8760 hours. HbA1C: No results for input(s): HGBA1C in the last 72 hours. CBG: Recent Labs  Lab 04/04/17 1949 04/04/17 2350 04/05/17 0618 04/05/17 0817 04/05/17 1135  GLUCAP 140* 108* 139* 129* 84   Lipid Profile: No results for input(s): CHOL, HDL, LDLCALC, TRIG, CHOLHDL, LDLDIRECT in the last 72 hours. Thyroid Function Tests: No results for input(s): TSH, T4TOTAL, FREET4, T3FREE, THYROIDAB in the last 72 hours. Anemia Panel: No results for input(s): VITAMINB12, FOLATE, FERRITIN, TIBC, IRON, RETICCTPCT in the last 72 hours. Urine analysis:    Component Value Date/Time   COLORURINE YELLOW 03/27/2017 [place] 03/27/2017 2226   APPEARANCEUR Clear 02/04/2015 0940   LABSPEC 1.011 03/27/2017 2226   PHURINE 5.0 03/27/2017 2226   GLUCOSEU NEGATIVE 03/27/2017 2226   HGBUR NEGATIVE 03/27/2017 2226   BILIRUBINUR NEGATIVE 03/27/2017 2226   BILIRUBINUR Negative 02/04/2015 0940   KETONESUR NEGATIVE 03/27/2017 2226   PROTEINUR NEGATIVE 03/27/2017 2226   UROBILINOGEN 1.0 02/04/2015 1631   NITRITE NEGATIVE 03/27/2017 2226   LEUKOCYTESUR NEGATIVE 03/27/2017 2226   LEUKOCYTESUR Negative 02/04/2015 0940   Sepsis Labs: _0 (procalcitonin:4,lacticidven:4)  ) Recent Results (from the past 240 hour(s))  Culture, blood (Routine x 2)     Status: None    Collection Time: 03/27/17 12:41 PM  Result Value Ref Range Status   Specimen Description BLOOD LEFT ANTECUBITAL  Final   Special Requests   Final    BOTTLES DRAWN AEROBIC AND ANAEROBIC Blood Culture adequate volume   Culture NO GROWTH 5 DAYS  Final   Report Status 04/01/2017 FINAL  Final  Culture, blood (Routine x 2)     Status: None   Collection Time: 03/27/17  1:15 PM  Result Value Ref Range Status   Specimen Description BLOOD LEFT ARM  Final   Special Requests   Final    BOTTLES DRAWN AEROBIC AND ANAEROBIC Blood Culture adequate volume   Culture NO GROWTH 5 DAYS  Final   Report Status 04/01/2017 FINAL  Final  Urine culture     Status: None   Collection Time: 03/27/17 10:26 PM  Result Value Ref Range Status   Specimen Description URINE, CLEAN CATCH  Final   Special Requests NONE  Final   Culture NO GROWTH  Final   Report Status 03/29/2017 FINAL  Final  MRSA PCR Screening     Status: None   Collection Time: 03/28/17  6:54 AM  Result Value Ref Range Status   MRSA by PCR NEGATIVE NEGATIVE Final    Comment:        The GeneXpert MRSA Assay (FDA approved for NASAL specimens only), is one component of a comprehensive MRSA colonization surveillance program. It is not intended to diagnose MRSA infection nor to guide or monitor treatment for MRSA infections.   Culture, blood (routine x 2)     Status: None   Collection Time: 03/31/17  8:58 AM  Result Value Ref Range Status   Specimen Description BLOOD LEFT ANTECUBITAL  Final   Special Requests   Final    BOTTLES DRAWN AEROBIC ONLY Blood Culture adequate volume   Culture NO GROWTH 5 DAYS  Final   Report Status 04/05/2017 FINAL  Final  Culture, blood (routine x 2)     Status: None   Collection Time: 03/31/17  9:05 AM  Result Value Ref Range Status   Specimen Description BLOOD LEFT FOREARM  Final   Special Requests   Final  BOTTLES DRAWN AEROBIC ONLY Blood Culture adequate volume   Culture NO GROWTH 5 DAYS  Final   Report  Status 04/05/2017 FINAL  Final      Studies: Dg Swallowing Func-speech Pathology  Result Date: 04/05/2017 Objective Swallowing Evaluation: Type of Study: MBS-Modified Barium Swallow Study  Patient Details Name: Shane Gentry MRN: [id number] Date of Birth: [birth year] Today's Date: 04/05/2017 Time: SLP Start Time (ACUTE ONLY): 3532 -SLP Stop Time (ACUTE ONLY): 0915 SLP Time Calculation (min) (ACUTE ONLY): 12 min Past Medical History: Past Medical History: Diagnosis Date . ADD (attention deficit disorder)  . Anxiety  . Asthma  . Depression  . HLD (hyperlipidemia)  . Prostate cancer ([place])  . Schizoaffective disorder ([place])  . Seasonal allergies  Past Surgical History: Past Surgical History: Procedure Laterality Date . ESOPHAGOGASTRODUODENOSCOPY (EGD) WITH PROPOFOL N/A 04/16/2015  Procedure: ESOPHAGOGASTRODUODENOSCOPY (EGD) WITH PROPOFOL;  Surgeon: Josefine Class, MD;  Location: [facility] ENDOSCOPY;  Service: Endoscopy;  Laterality: N/A; . NO PAST SURGERIES   HPI: 41 year old male living in a group home with a history of ADD, anxiety, asthma, depression, schizoaffective disorder from a group home who presented to the ED with AMS and lethargy. ED visit on 11/16 for evaluation of urinary frequency and being off balance.CT no acute abnormality and pt discharged. Chest x-ray revealed patchy bibasilar airspace opacification is worrisome for pneumonia. MBS 11/20 recommended Dys 2, thin liquids, aspiration event suspected and transferred to ICU; wet vocal quality and congestion and SLP initiated honey thick liquids then downgraded to nectar. Repeat MBS today assess current swallow function and recommend safest  po's prior to discharge.  No Data Recorded Assessment / Plan / Recommendation CHL IP CLINICAL IMPRESSIONS 04/05/2017 Clinical Impression Pt exhibited similar oropharyngeal swallow ability as prior MBS 11/201/18 although distratcted first half study with need to urinate. As noted prior, when he is internally  distracted by pain/discomfort, pt demonstrates oral holding, transit delays, head extention and spastic muscle response due to internal distractions. Study paused; pt attempted to use urinal with subsequent improvements in pt's focus/attention and increase in oral transit time. Pharyngeal phase marked by intermittent swallow initiation at his pyriform sinsuses completely filling before swallow triggered. Min-mild vallecular/pyriform sinsus residue beginning to spill from pyriform sinus before initiating swallow. No significant barium observed in vestibule or trachea during study although pt's oral impairments, cognition and waxing/waning episodes of pain/inattention increase aspiration risk, therefore recommend pt to remain on nectar thick liquids and Dys 2 texture with Speech Therapy follow up via home health once back at group home.  SLP Visit Diagnosis Dysphagia, oropharyngeal phase (R13.12) Attention and concentration deficit following -- Frontal lobe and executive function deficit following -- Impact on safety and function Moderate aspiration risk   CHL IP TREATMENT RECOMMENDATION 04/05/2017 Treatment Recommendations Therapy as outlined in treatment plan below   Prognosis 04/05/2017 Prognosis for Safe Diet Advancement (No Data) Barriers to Reach Goals Cognitive deficits Barriers/Prognosis Comment -- CHL IP DIET RECOMMENDATION 04/05/2017 SLP Diet Recommendations Dysphagia 2 (Fine chop) solids;Nectar thick liquid Liquid Administration via Straw;Cup Medication Administration Crushed with puree Compensations Minimize environmental distractions;Slow rate;Small sips/bites;Clear throat intermittently Postural Changes Seated upright at 90 degrees   CHL IP OTHER RECOMMENDATIONS 04/05/2017 Recommended Consults -- Oral Care Recommendations Oral care BID Other Recommendations --   CHL IP FOLLOW UP RECOMMENDATIONS 04/05/2017 Follow up Recommendations 24 hour supervision/assistance   CHL IP FREQUENCY AND DURATION 04/05/2017  Speech Therapy Frequency (ACUTE ONLY) min 2x/week Treatment Duration 2 weeks      CHL IP  ORAL PHASE 04/05/2017 Oral Phase Impaired Oral - Pudding Teaspoon -- Oral - Pudding Cup -- Oral - Honey Teaspoon -- Oral - Honey Cup NT Oral - Nectar Teaspoon -- Oral - Nectar Cup NT Oral - Nectar Straw -- Oral - Thin Teaspoon -- Oral - Thin Cup Lingual pumping;Holding of bolus Oral - Thin Straw Lingual pumping;Holding of bolus Oral - Puree Delayed oral transit Oral - Mech Soft -- Oral - Regular Reduced posterior propulsion;Delayed oral transit Oral - Multi-Consistency -- Oral - Pill -- Oral Phase - Comment --  CHL IP PHARYNGEAL PHASE 04/05/2017 Pharyngeal Phase Impaired Pharyngeal- Pudding Teaspoon -- Pharyngeal -- Pharyngeal- Pudding Cup -- Pharyngeal -- Pharyngeal- Honey Teaspoon -- Pharyngeal -- Pharyngeal- Honey Cup NT Pharyngeal -- Pharyngeal- Nectar Teaspoon -- Pharyngeal -- Pharyngeal- Nectar Cup NT Pharyngeal -- Pharyngeal- Nectar Straw NT Pharyngeal -- Pharyngeal- Thin Teaspoon -- Pharyngeal -- Pharyngeal- Thin Cup Delayed swallow initiation-pyriform sinuses Pharyngeal -- Pharyngeal- Thin Straw Delayed swallow initiation-pyriform sinuses;Pharyngeal residue - valleculae;Pharyngeal residue - pyriform;Penetration/Apiration after swallow Pharyngeal Material enters airway, remains ABOVE vocal cords then ejected out Pharyngeal- Puree WFL Pharyngeal -- Pharyngeal- Mechanical Soft -- Pharyngeal -- Pharyngeal- Regular Pharyngeal residue - valleculae;Reduced epiglottic inversion Pharyngeal -- Pharyngeal- Multi-consistency -- Pharyngeal -- Pharyngeal- Pill -- Pharyngeal -- Pharyngeal Comment --  CHL IP CERVICAL ESOPHAGEAL PHASE 04/05/2017 Cervical Esophageal Phase WFL Pudding Teaspoon -- Pudding Cup -- Honey Teaspoon -- Honey Cup -- Nectar Teaspoon -- Nectar Cup -- Nectar Straw -- Thin Teaspoon -- Thin Cup -- Thin Straw -- Puree -- Mechanical Soft -- Regular -- Multi-consistency -- Pill -- Cervical Esophageal Comment -- No  flowsheet data found. [name] 04/05/2017, 10:17 AM [name] CCC-SLP Pager [phone]               Scheduled Meds: . amoxicillin-clavulanate  1 tablet Oral Q12H  . benzonatate  100 mg Oral Q8H  . diltiazem  180 mg Oral Daily  . divalproex  1,000 mg Oral QHS  . free water  200 mL Oral Q8H  . heparin  5,000 Units Subcutaneous Q8H  . hydrocortisone sod succinate (SOLU-CORTEF) inj  50 mg Intravenous Q12H  . LORazepam  2 mg Oral BID  . montelukast  10 mg Oral QHS  . [START ON 04/06/2017] risperiDONE microspheres  50 mg Intramuscular Q14 Days  . temazepam  30 mg Oral QHS    Continuous Infusions: . dextrose 125 mL/hr at 04/05/17 0119     LOS: 9 days     Alma Friendly, MD [facility]  If 7PM-7AM, please contact night-coverage [email or web address] Password [email or web address] 04/05/2017, 3:08 PM

## 2017-04-06 DIAGNOSIS — N251 Nephrogenic diabetes insipidus: Secondary | ICD-10-CM

## 2017-04-06 DIAGNOSIS — E870 Hyperosmolality and hypernatremia: Secondary | ICD-10-CM

## 2017-04-06 LAB — RENAL FUNCTION PANEL
Albumin: 2.6 g/dL — ABNORMAL LOW (ref 3.5–5.0)
Anion gap: 5 (ref 5–15)
BUN: 10 mg/dL (ref 6–20)
CHLORIDE: 118 mmol/L — AB (ref 101–111)
CO2: 28 mmol/L (ref 22–32)
CREATININE: 1.18 mg/dL (ref 0.61–1.24)
Calcium: 9.2 mg/dL (ref 8.9–10.3)
GFR calc Af Amer: >60 mL/min (ref >60)
GLUCOSE: 128 mg/dL — AB (ref 65–99)
Phosphorus: 3.2 mg/dL (ref 2.5–4.6)
Potassium: 3.4 mmol/L — ABNORMAL LOW (ref 3.5–5.1)
Sodium: 151 mmol/L — ABNORMAL HIGH (ref 135–145)

## 2017-04-06 LAB — CBC WITH DIFFERENTIAL/PLATELET
Basophils Absolute: 0 K/uL (ref 0.0–0.1)
Basophils Relative: 0 %
Eosinophils Absolute: 0 K/uL (ref 0.0–0.7)
Eosinophils Relative: 0 %
HCT: 37.3 % — ABNORMAL LOW (ref 39.0–52.0)
Hemoglobin: 11.2 g/dL — ABNORMAL LOW (ref 13.0–17.0)
Lymphocytes Relative: 13 %
Lymphs Abs: 1.8 K/uL (ref 0.7–4.0)
MCH: 28.7 pg (ref 26.0–34.0)
MCHC: 30 g/dL (ref 30.0–36.0)
MCV: 95.6 fL (ref 78.0–100.0)
Monocytes Absolute: 1.8 K/uL — ABNORMAL HIGH (ref 0.1–1.0)
Monocytes Relative: 14 %
Neutro Abs: 9.6 K/uL — ABNORMAL HIGH (ref 1.7–7.7)
Neutrophils Relative %: 73 %
Platelets: 96 K/uL — ABNORMAL LOW (ref 150–400)
RBC: 3.9 MIL/uL — ABNORMAL LOW (ref 4.22–5.81)
RDW: 17.1 % — ABNORMAL HIGH (ref 11.5–15.5)
WBC: 13.2 K/uL — ABNORMAL HIGH (ref 4.0–10.5)

## 2017-04-06 LAB — GLUCOSE, CAPILLARY
GLUCOSE-CAPILLARY: 110 mg/dL — AB (ref 65–99)
GLUCOSE-CAPILLARY: 86 mg/dL (ref 65–99)
GLUCOSE-CAPILLARY: 94 mg/dL (ref 65–99)
GLUCOSE-CAPILLARY: 136 mg/dL — AB (ref 65–99)
Glucose-Capillary: 111 mg/dL — ABNORMAL HIGH (ref 65–99)
Glucose-Capillary: 124 mg/dL — ABNORMAL HIGH (ref 65–99)
Glucose-Capillary: 84 mg/dL (ref 65–99)

## 2017-04-06 LAB — NA AND K (SODIUM & POTASSIUM), RAND UR
POTASSIUM UR: 12 mmol/L
SODIUM UR: 49 mmol/L

## 2017-04-06 LAB — BASIC METABOLIC PANEL
ANION GAP: 7 (ref 5–15)
BUN: 11 mg/dL (ref 6–20)
CO2: 30 mmol/L (ref 22–32)
Calcium: 9.4 mg/dL (ref 8.9–10.3)
Chloride: 122 mmol/L — ABNORMAL HIGH (ref 101–111)
Creatinine, Ser: 1.22 mg/dL (ref 0.61–1.24)
Glucose, Bld: 92 mg/dL (ref 65–99)
Potassium: 3.7 mmol/L (ref 3.5–5.1)
SODIUM: 159 mmol/L — AB (ref 135–145)

## 2017-04-06 LAB — OSMOLALITY: Osmolality: 329 mOsm/kg (ref 275–295)

## 2017-04-06 LAB — OSMOLALITY, URINE: Osmolality, Ur: 194 mOsm/kg — ABNORMAL LOW (ref 300–900)

## 2017-04-06 MED ORDER — PREDNISONE 20 MG PO TABS
40.0000 mg | ORAL_TABLET | Freq: Every day | ORAL | Status: DC
  Administered 2017-04-07: 40 mg via ORAL
  Filled 2017-04-06: qty 2

## 2017-04-06 MED ORDER — AMILORIDE HCL 5 MG PO TABS
10.0000 mg | ORAL_TABLET | Freq: Every day | ORAL | Status: DC
  Administered 2017-04-06 – 2017-04-12 (×7): 10 mg via ORAL
  Filled 2017-04-06 (×7): qty 2

## 2017-04-06 MED ORDER — RISPERIDONE MICROSPHERES 50 MG IM SUSR
50.0000 mg | INTRAMUSCULAR | Status: DC
  Administered 2017-04-07: 50 mg via INTRAMUSCULAR
  Filled 2017-04-06 (×3): qty 2

## 2017-04-06 MED ORDER — VALPROATE SODIUM 250 MG/5ML PO SOLN
250.0000 mg | Freq: Four times a day (QID) | ORAL | Status: DC
  Administered 2017-04-06 – 2017-04-13 (×26): 250 mg via ORAL
  Filled 2017-04-06 (×29): qty 5

## 2017-04-06 NOTE — Plan of Care (Signed)
Continue current care plan 

## 2017-04-06 NOTE — Progress Notes (Signed)
Pt transferring to 3E14 via bed with safety sitter and belongings.

## 2017-04-06 NOTE — Progress Notes (Signed)
PROGRESS NOTE  Shane Gentry ZOX:[id number] DOB: 02-28-1976 DOA: 03/27/2017 PCP: [name], MD  HPI/Recap of past 35 hours: 41 year old long-term resident of group home with hx of prior SBO, HTN, HLD, PAF, failure to thrive, hypothyroidism, schizoaffective disorder + MR, prior dysphagia, severe chronic constipation, presented from group home with altered mental status, cough. On arrival 11/19 to emergency room core temperature of 88 patient was given warm fluids then chest x-ray revealed bilateral infiltrates suggestive of pneumonia, glucose was 60 patient received 2-1/2 L normal saline for presumed aspiration pneumonia. Pt had recurrent aspiration and had to go to the ICU on 11/23, however has improved but currently still on AB.  Today, met patient enjoying breakfast. Reported feeling well. Pt denies any chest pain, SOB, abdominal pain, N/V/D/C, fever/chills.   Assessment/Plan: Principal Problem:   Aspiration pneumonitis (HCC) Active Problems:   Schizoaffective disorder, depressive type ([place])   Failure to thrive in adult   Hyperthyroidism   Sepsis secondary to UTI ([place])   Sepsis ([place])   Aspiration pneumonia ([place])  #Recurrent aspiration pneumonia Improving Afebrile, resolving leukocytosis, not requiring O2 SLP has eva speech therapy evaluated recommend DYS 2 nectar thick Was de-escalated to po levaquin 11/22 D/C vanc/zosyn on 11/23-tapered to Augmentin 11/25 Bc x 2 NGTD, pct lower D/C solu cortef 100 q8-->50 q12, titrated down to Prednisone 96m daily, will wean off  #Hypernatremia Na 159 on 04/06/17 Na keeps trending up, +polyuria Nephrology consulted: rec increasing D5W, starting amiloride 140mdaily. 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  Resolved, baseline creatinine anywhere  from 1.0-1.2 ?nephrogenic induced Lithium DI BUN/creatinine better but still hypernatremic Daily BMP  #P.Aflutter Controlled S/P Cardizem gtt [facility]: LVEF 65-70%, Grade 1DD Cardiology on board: Continue PO diltiazem, [facility]#Persistent hypoglycemia Resolved Etiology is unclear Because of his persistent hypoglycemia we will rule out rare causes such as insulinoma nursing order has been placed for insulin level, C-peptide, beta-hydroxybutyrate and proinsulin levels Follow up as an outpt  #Acute hypoxic resp failure Resolved Probably 2/2 pna CXR 11/25 shows fluid infection Repeat chest x-ray 1 view 11/26 with unchanged infiltrates suggestive of PNA  PRN O2  #Met encephalopathy Resolving 2/2 to sepsis vs hypoglycemia Fluid now just d5W  #Hypothyroidism TSH slightly elevated at 7.7, likely due to acute illness Outpt follow up required  #Affective disorder, MR reported Stable Resuming home meds 11/24 Risperdal Consta 50 Q 14--ordered for 11/29 temazepam 30 at bedtime, Depakote 1000 at bedtime  lorazepam 2 mg twice daily Hold lithium 1 capsule at bedtime due to ??Diabetes insipidus  #HTN holding metoprolol XL 25 due to soft BP  #Hyperlipidemia  Continue atorvastatin 20  #Chronic severe constipation  Continue MiraLAX 17 g daily ensure proper bowel regimen  #Thrombocytopenia, unclear cause [facility] but no information currently available at this time DIC panel is negative think this is probably just medication related    Code Status: Full  Family Communication: None at bedside  Disposition Plan: Possibly back to NH by 04/07/17   Consultants:  None  Procedures:  None   Antimicrobials:  PO Augmentin  DVT prophylaxis:  Heparin Kelly   Objective: Vitals:   04/06/17 0307 04/06/17 0748 04/06/17 1208 04/06/17 1711  BP: 113/72 115/80 119/77  120/74  Pulse: 88 72  96 88  Resp: 18 11 17 18   Temp: 99.1 F (37.3 C) 98.8 F (37.1 C) 98.8 F (37.1 C) 98.7 F (37.1 C)  TempSrc: Oral Oral Oral Oral  SpO2: 96% 96% 96% 97%  Weight:      Height:        Intake/Output Summary (Last 24 hours) at 04/06/2017 1713 Last data filed at 04/06/2017 1600 Gross per 24 hour  Intake 4512.92 ml  Output 3800 ml  Net 712.92 ml   Filed Weights   04/03/17 0320 04/04/17 0344 04/05/17 0341  Weight: 79.6 kg (175 lb 7.8 oz) 77 kg (169 lb 12.1 oz) 75.8 kg (167 lb 1.7 oz)    Exam:   General: alert, awake, oriented to self  Cardiovascular: S1-S2 present, no added hrt sound  Respiratory: Chest clear bilaterally  Abdomen: Soft, non tender, non-distended, BS present  Musculoskeletal: No bilateral pedal edema  Skin: Normal  Psychiatry: Unable to assess    Data Reviewed: CBC: Recent Labs  Lab 04/02/17 0240 04/03/17 0816 04/04/17 0225 04/05/17 0243 04/06/17 0403  WBC 19.0* 19.5* 17.7* 13.5* 13.2*  NEUTROABS 16.6* 15.5* 14.6* 9.6* 9.6*  HGB 11.6* 12.8* 11.5* 11.4* 11.2*  HCT 36.8* 41.2 37.7* 37.1* 37.3*  MCV 92.2 94.7 94.5 95.4 95.6  PLT 62* 57* 52* 62* 96*   Basic Metabolic Panel: Recent Labs  Lab 03/31/17 0618  04/02/17 0240 04/02/17 1232 04/03/17 0816 04/03/17 1345 04/04/17 0225 04/05/17 0243 04/06/17 0403 04/06/17 1604  NA 146*   < > 148*  --  155* 152* 153* 153* 159* 151*  K 2.9*   < > 4.0  --  3.6 3.4* 3.4* 3.4* 3.7 3.4*  CL 117*   < > 119*  --  119* 119* 120* 118* 122* 118*  CO2 24   < > 24  --  31 30 30 31 30 28   GLUCOSE 102*   < > 100*  --  78 99 104* 103* 92 128*  BUN 13   < > 13  --  10 9 7 6 11 10   CREATININE 1.21   < > 1.28*  --  1.18 1.21 1.09 1.11 1.22 1.18  CALCIUM 9.0   < > 9.1  --  9.8 9.7 9.4 9.3 9.4 9.2  MG  --   --   --  2.3 2.1  --   --   --   --   --   PHOS 2.9  --  3.2  --   --   --  3.6 2.9  --  3.2   < > = values in this interval not displayed.   GFR: Estimated Creatinine Clearance: 80.7 mL/min (by C-G formula  based on SCr of 1.18 mg/dL). Liver Function Tests: Recent Labs  Lab 04/01/17 0137 04/02/17 0240 04/03/17 0816 04/04/17 0225 04/05/17 0243 04/06/17 1604  AST 21  --  16  --   --   --   ALT 24  --  20  --   --   --   ALKPHOS 27*  --  28*  --   --   --   BILITOT 0.9  --  0.4  --   --   --   PROT 5.4*  --  5.7*  --   --   --   ALBUMIN 2.3* 2.2* 2.6* 2.2* 2.3* 2.6*   No results for input(s): LIPASE, AMYLASE in the last 168 hours. No results for input(s): AMMONIA in the  last 168 hours. Coagulation Profile: Recent Labs  Lab 03/31/17 0858  INR 0.97   Cardiac Enzymes: No results for input(s): CKTOTAL, CKMB, CKMBINDEX, TROPONINI in the last 168 hours. BNP (last 3 results) No results for input(s): PROBNP in the last 8760 hours. HbA1C: No results for input(s): HGBA1C in the last 72 hours. CBG: Recent Labs  Lab 04/06/17 0242 04/06/17 0453 04/06/17 0844 04/06/17 1143 04/06/17 1604  GLUCAP 111* 110* 86 94 136*   Lipid Profile: No results for input(s): CHOL, HDL, LDLCALC, TRIG, CHOLHDL, LDLDIRECT in the last 72 hours. Thyroid Function Tests: No results for input(s): TSH, T4TOTAL, FREET4, T3FREE, THYROIDAB in the last 72 hours. Anemia Panel: No results for input(s): VITAMINB12, FOLATE, FERRITIN, TIBC, IRON, RETICCTPCT in the last 72 hours. Urine analysis:    Component Value Date/Time   COLORURINE YELLOW 03/27/2017 2226   APPEARANCEUR CLEAR 03/27/2017 2226   APPEARANCEUR Clear 02/04/2015 0940   LABSPEC 1.011 03/27/2017 2226   PHURINE 5.0 03/27/2017 2226   GLUCOSEU NEGATIVE 03/27/2017 2226   HGBUR NEGATIVE 03/27/2017 2226   BILIRUBINUR NEGATIVE 03/27/2017 2226   BILIRUBINUR Negative 02/04/2015 0940   KETONESUR NEGATIVE 03/27/2017 2226   PROTEINUR NEGATIVE 03/27/2017 2226   UROBILINOGEN 1.0 02/04/2015 1631   NITRITE NEGATIVE 03/27/2017 2226   LEUKOCYTESUR NEGATIVE 03/27/2017 2226   LEUKOCYTESUR Negative 02/04/2015 0940   Sepsis  Labs: @LABRCNTIP (procalcitonin:4,lacticidven:4)  ) Recent Results (from the past 240 hour(s))  Urine culture     Status: None   Collection Time: 03/27/17 10:26 PM  Result Value Ref Range Status   Specimen Description URINE, CLEAN CATCH  Final   Special Requests NONE  Final   Culture NO GROWTH  Final   Report Status 03/29/2017 FINAL  Final  MRSA PCR Screening     Status: None   Collection Time: 03/28/17  6:54 AM  Result Value Ref Range Status   MRSA by PCR NEGATIVE NEGATIVE Final    Comment:        The GeneXpert MRSA Assay (FDA approved for NASAL specimens only), is one component of a comprehensive MRSA colonization surveillance program. It is not intended to diagnose MRSA infection nor to guide or monitor treatment for MRSA infections.   Culture, blood (routine x 2)     Status: None   Collection Time: 03/31/17  8:58 AM  Result Value Ref Range Status   Specimen Description BLOOD LEFT ANTECUBITAL  Final   Special Requests   Final    BOTTLES DRAWN AEROBIC ONLY Blood Culture adequate volume   Culture NO GROWTH 5 DAYS  Final   Report Status 04/05/2017 FINAL  Final  Culture, blood (routine x 2)     Status: None   Collection Time: 03/31/17  9:05 AM  Result Value Ref Range Status   Specimen Description BLOOD LEFT FOREARM  Final   Special Requests   Final    BOTTLES DRAWN AEROBIC ONLY Blood Culture adequate volume   Culture NO GROWTH 5 DAYS  Final   Report Status 04/05/2017 FINAL  Final      Studies: No results found.  Scheduled Meds: . aMILoride  10 mg Oral Daily  . amoxicillin-clavulanate  1 tablet Oral Q12H  . benzonatate  100 mg Oral Q8H  . diltiazem  180 mg Oral Daily  . divalproex  1,000 mg Oral QHS  . free water  200 mL Oral Q8H  . heparin  5,000 Units Subcutaneous Q8H  . hydrocortisone sod succinate (SOLU-CORTEF) inj  50 mg Intravenous Q12H  .  LORazepam  2 mg Oral BID  . montelukast  10 mg Oral QHS  . [START ON 04/07/2017] risperiDONE microspheres  50 mg  Intramuscular Q14 Days  . temazepam  30 mg Oral QHS    Continuous Infusions: . dextrose 250 mL/hr at 04/06/17 1550     LOS: 10 days     Alma Friendly, MD [facility]  If 7PM-7AM, please contact night-coverage [email or web address] Password [email or web address] 04/06/2017, 5:13 PM

## 2017-04-06 NOTE — Evaluation (Signed)
Physical Therapy Evaluation Patient Details Name: Shane Gentry MRN: [id number] DOB: 02/10/1976 Today's Date: 04/06/2017   History of Present Illness  This 41 y.o. male admitted from his group home with AMS. Pt found to have sepsis due to UTI.   X-ray showed PNA, likely due to aspiration.  PMH includes:  MR + schizoaffective disorder, h/o SBO, chronic kidney disease  Clinical Impression  Patient presents with decreased independence and safety with mobility due to decreased balance, decreased awareness, decreased activity tolerance and general debility.  Currently mod to min A for ambulation and even standing balance due to poor protective responses.  Feel patient may need SNF level rehab if staff at group home unable to provide this level of assistance.  Could potentially maneuver safely with S at w/c level if d/c to group home desired.  Would need HHPT if d/c home.     Follow Up Recommendations Supervision/Assistance - 24 hour;SNF    Equipment Recommendations  Wheelchair (measurements PT)    Recommendations for Other Services       Precautions / Restrictions Precautions Precautions: Fall Precaution Comments: Posterior LOB in standing, no corrective responses noted       Mobility  Bed Mobility Overal bed mobility: Needs Assistance Bed Mobility: Supine to Sit     Supine to sit: Supervision;HOB elevated        Transfers Overall transfer level: Needs assistance Equipment used: 1 person hand held assist Transfers: Sit to/from Stand Sit to Stand: Min assist;+2 safety/equipment Stand pivot transfers: Mod assist;+2 safety/equipment       General transfer comment: assist for balance   Ambulation/Gait Ambulation/Gait assistance: Mod assist;+2 physical assistance Ambulation Distance (Feet): 150 Feet Assistive device: 1 person hand held assist Gait Pattern/deviations: Step-through pattern;Decreased stride length;Wide base of support;Shuffle;Decreased dorsiflexion - right     General Gait Details: dragging feet bilaterally but able to pick up with cues, but degrades back to dragging feet R > L; also note pt leaning posteriorly at times standing in hallway switching to HHA from person on R side, +2 A for safety with balance and equipment  Stairs            Wheelchair Mobility    Modified Rankin (Stroke Patients Only)       Balance Overall balance assessment: Needs assistance   Sitting balance-Leahy Scale: Fair Sitting balance - Comments: static balance okay, but LOB posterior and anterior fixing socks in sitting     Standing balance-Leahy Scale: Poor Standing balance comment: leans posteriorly in static standing min to mod A for balance                             Pertinent Vitals/Pain Pain Assessment: Faces Faces Pain Scale: Hurts a little bit Pain Location: R hand Pain Descriptors / Indicators: Tender Pain Intervention(s): Monitored during session    Home Living Family/patient expects to be discharged to:: Group home Living Arrangements: Group Home Available Help at Discharge: Personal care attendant Type of Home: [place]: One level(pt also reports level entry)        Prior Function Level of Independence: Independent         Comments: Pt indicates he is fully independent with ADLs and ambulation without AD, however, no caregiver present to confirm info; also states access community via city bus     Hand Dominance   Dominant Hand: Right    Extremity/Trunk Assessment  Upper Extremity Assessment Upper Extremity Assessment: Defer to OT evaluation RUE Deficits / Details: Pt with what appears to be mild edema Lt hand.  Pt maintains wrists flexed with hands in partially flexed position.  He requires prompting to use hands during functional activities - often requests assistance  RUE Coordination: decreased fine motor LUE Deficits / Details: Pt with what appears to be mild edema Lt hand.  Pt  maintains wrists flexed with hands in partially flexed position.  He requires prompting to use hands during functional activities - often requests assistance  LUE Coordination: decreased fine motor    Lower Extremity Assessment Lower Extremity Assessment: RLE deficits/detail RLE Deficits / Details: some decreased strength knee extension with testing, but pt not fully comprehending testing procedures. did note R foot dragging more with ambulation    Cervical / Trunk Assessment Cervical / Trunk Assessment: Normal  Communication   Communication: Expressive difficulties  Cognition Arousal/Alertness: Awake/alert Behavior During Therapy: WFL for tasks assessed/performed Overall Cognitive Status: No family/caregiver present to determine baseline cognitive functioning                                        General Comments General comments (skin integrity, edema, etc.): HR max 121 with ambulation    Exercises     Assessment/Plan    PT Assessment Patient needs continued PT services  PT Problem List Decreased mobility;Decreased safety awareness;Decreased activity tolerance;Decreased knowledge of use of DME;Decreased balance;Impaired sensation       PT Treatment Interventions DME instruction;Therapeutic activities;Patient/family education;Therapeutic exercise;Gait training;Stair training;Balance training;Functional mobility training    PT Goals (Current goals can be found in the Care Plan section)  Acute Rehab PT Goals Patient Stated Goal: None stated PT Goal Formulation: Patient unable to participate in goal setting Time For Goal Achievement: 04/13/17 Potential to Achieve Goals: Good    Frequency Min 3X/week   Barriers to discharge        Co-evaluation PT/OT/SLP Co-Evaluation/Treatment: Yes Reason for Co-Treatment: For patient/therapist safety PT goals addressed during session: Mobility/safety with mobility;Balance OT goals addressed during session: ADL's and  self-care       AM-PAC PT "6 Clicks" Daily Activity  Outcome Measure Difficulty turning over in bed (including adjusting bedclothes, sheets and blankets)?: A Little Difficulty moving from lying on back to sitting on the side of the bed? : A Little Difficulty sitting down on and standing up from a chair with arms (e.g., wheelchair, bedside commode, etc,.)?: Unable Help needed moving to and from a bed to chair (including a wheelchair)?: A Little Help needed walking in hospital room?: A Little Help needed climbing 3-5 steps with a railing? : A Lot 6 Click Score: 15    End of Session Equipment Utilized During Treatment: Gait belt Activity Tolerance: Patient tolerated treatment well Patient left: in chair;with chair alarm set;with call bell/phone within reach;with nursing/sitter in room   PT Visit Diagnosis: Other symptoms and signs involving the nervous system (R29.898);Other abnormalities of gait and mobility (R26.89);Unsteadiness on feet (R26.81)    Time: 6967-8938 PT Time Calculation (min) (ACUTE ONLY): 30 min   Charges:   PT Evaluation $PT Eval Moderate Complexity: 1 Mod     PT G CodesMagda Gentry, [place] [phone] 04/06/2017   Shane Gentry 04/06/2017, 4:13 PM

## 2017-04-06 NOTE — Consult Note (Addendum)
Reason for Consult: Hypernatremia Referring Physician: Horris Latino, MD  Shane Gentry is an 41 y.o. male.  HPI: Shane Gentry is a 41 yo AAM with an extensive PMH significant for ADD, anxiety, depression, asthma, CKD stage 2-3, schizoaffective disorder on lithium (lives in a group home) who was  Admitted on 03/27/17 with increased urinary frequency and altered mental status.  He is not a good historian due to his underlying psychiatric issues and most of the history was obtained from a thorough review of the electronic health records.  He was found to be hypotensive and hypothermic with bibasilar infiltrates on CXR consistent with recurrent aspiration pneumonia.  Pt was admitted to the SDU and treated with antibiotics.  During his hospitalization, he developed worsening hypernatremia.  The trend is seen below.  We were asked to help further evaluate and manage his worsening hypernatremia.  Of note, his lithium was discontinued on 04/02/17 and has been on lithium since at least 2 years but unable to obtain exact date given pt's mental status.  He has also been receiving IV D5W since 04/02/17.  He has had significant UOP of 3-5 liters/day since starting the IVF's.  He was seen by Speech Pathology and barium swallow revealed inattention to swallowing when distracted by pain or discomfort.  He is on a dysphagia 2 diet.    Trend in Serum Sodium  Sodium  Date/Time Value Ref Range Status  04/06/2017 04:03 AM 159 (H) 135 - 145 mmol/L Final  04/05/2017 02:43 AM 153 (H) 135 - 145 mmol/L Final  04/04/2017 02:25 AM 153 (H) 135 - 145 mmol/L Final  04/03/2017 01:45 PM 152 (H) 135 - 145 mmol/L Final  04/03/2017 08:16 AM 155 (H) 135 - 145 mmol/L Final  04/02/2017 02:40 AM 148 (H) 135 - 145 mmol/L Final  04/01/2017 01:37 AM 147 (H) 135 - 145 mmol/L Final  03/31/2017 06:18 AM 146 (H) 135 - 145 mmol/L Final  03/30/2017 08:52 AM 146 (H) 135 - 145 mmol/L Final  03/29/2017 03:17 AM 145 135 - 145 mmol/L Final  03/28/2017  03:20 AM 147 (H) 135 - 145 mmol/L Final  03/27/2017 12:41 PM 145 135 - 145 mmol/L Final  03/24/2017 10:17 AM 145 135 - 145 mmol/L Final  11/09/2016 05:50 PM 134 (L) 135 - 145 mmol/L Final  09/25/2015 04:14 AM 146 (H) 135 - 145 mmol/L Final  09/24/2015 11:25 AM 140 135 - 145 mmol/L Final  02/18/2015 01:11 PM 145 135 - 145 mmol/L Final  02/04/2015 04:16 PM 146 (H) 135 - 145 mmol/L Final  02/04/2015 04:14 PM 149 (H) 135 - 145 mmol/L Final   PMH:   Past Medical History:  Diagnosis Date  . ADD (attention deficit disorder)   . Anxiety   . Asthma   . Depression   . HLD (hyperlipidemia)   . Prostate cancer ([place])   . Schizoaffective disorder ([place])   . Seasonal allergies     PSH:   Past Surgical History:  Procedure Laterality Date  . ESOPHAGOGASTRODUODENOSCOPY (EGD) WITH PROPOFOL N/A 04/16/2015   Procedure: ESOPHAGOGASTRODUODENOSCOPY (EGD) WITH PROPOFOL;  Surgeon: Josefine Class, MD;  Location: [facility] ENDOSCOPY;  Service: Endoscopy;  Laterality: N/A;  . NO PAST SURGERIES      Allergies:  Allergies  Allergen Reactions  . Sulfa Antibiotics Hives    Medications:   Prior to Admission medications   Medication Sig Start Date End Date Taking? Authorizing Provider  atorvastatin (LIPITOR) 20 MG tablet Take 1 tablet (20 mg total) by mouth daily  at 6 PM. 02/24/15  Yes Pucilowska, Jolanta B, MD  divalproex (DEPAKOTE) 500 MG DR tablet Take 2 tablets (1,000 mg total) by mouth at bedtime. 02/24/15  Yes Pucilowska, Jolanta B, MD  docusate sodium (COLACE) 100 MG capsule Take 1 capsule (100 mg total) by mouth 2 (two) times daily. 02/24/15  Yes Pucilowska, Jolanta B, MD  lithium 600 MG capsule Take 1 capsule (600 mg total) by mouth at bedtime. 02/24/15  Yes Pucilowska, Jolanta B, MD  LORazepam (ATIVAN) 2 MG tablet Take 2 mg by mouth 2 (two) times daily.   Yes [provider]  metoprolol succinate (TOPROL-XL) 25 MG 24 hr tablet Take 1 tablet (25 mg total) by mouth daily. 02/24/15  Yes  Pucilowska, Jolanta B, MD  montelukast (SINGULAIR) 10 MG tablet Take 1 tablet (10 mg total) by mouth at bedtime. 02/24/15  Yes Pucilowska, Jolanta B, MD  omeprazole (PRILOSEC) 40 MG capsule Take 40 mg by mouth daily.   Yes [provider]  polyethylene glycol (MIRALAX / GLYCOLAX) packet Take 17 g by mouth daily. Patient taking differently: Take 17 g by mouth daily. Mix with 8 oz liquid and drink 02/24/15  Yes Pucilowska, Jolanta B, MD  risperiDONE microspheres (RISPERDAL CONSTA) 50 MG injection Inject 2 mLs (50 mg total) into the muscle every 14 (fourteen) days. Patient taking differently: Inject 50 mg into the muscle every 14 (fourteen) days. Every other Thursday 02/24/15  Yes Pucilowska, Jolanta B, MD  temazepam (RESTORIL) 30 MG capsule Take 1 capsule (30 mg total) by mouth at bedtime. 02/24/15  Yes Pucilowska, Jolanta B, MD  benzonatate (TESSALON) 100 MG capsule Take 1 capsule (100 mg total) by mouth every 8 (eight) hours. Patient not taking: Reported on 03/24/2017 11/09/16   Jeannett Senior, PA-C  guaiFENesin-dextromethorphan (ROBITUSSIN DM) 100-10 MG/5ML syrup Take 5 mLs by mouth every 4 (four) hours as needed for cough. Patient not taking: Reported on 03/24/2017 11/09/16   Jeannett Senior, PA-C    Discontinued Meds:   Medications Discontinued During This Encounter  Medication Reason  . lithium carbonate 300 MG capsule Change in therapy  . hydrocortisone sodium succinate (SOLU-CORTEF) 100 MG injection 50 mg   . dextrose 5 %-0.9 % sodium chloride infusion   . hydrocortisone sodium succinate (SOLU-CORTEF) 100 MG injection 100 mg   . vancomycin (VANCOCIN) IVPB 750 mg/150 ml premix   . piperacillin-tazobactam (ZOSYN) IVPB 3.375 g   . hydrocortisone sodium succinate (SOLU-CORTEF) 100 MG injection 50 mg   . lithium carbonate capsule 600 mg   . LORazepam (ATIVAN) tablet 2 mg   . temazepam (RESTORIL) capsule 30 mg   . dextrose 5 %-0.45 % sodium chloride infusion   . risperiDONE  microspheres (RISPERDAL CONSTA) injection 50 mg   . levofloxacin (LEVAQUIN) tablet 500 mg   . dextrose 5 % solution   . dextrose 5 % and 0.9% NaCl 1,000 mL with potassium chloride 60 mEq infusion   . atorvastatin (LIPITOR) tablet 20 mg   . divalproex (DEPAKOTE) DR tablet 1,000 mg   . pantoprazole (PROTONIX) EC tablet 40 mg   . acetaminophen (TYLENOL) tablet 650 mg   . acetaminophen (TYLENOL) suppository 650 mg   . ondansetron (ZOFRAN) tablet 4 mg   . dextrose 5 % 1,000 mL with potassium chloride 60 mEq infusion   . vancomycin (VANCOCIN) IVPB 750 mg/150 ml premix   . piperacillin-tazobactam (ZOSYN) IVPB 3.375 g   . hydrocortisone sodium succinate (SOLU-CORTEF) 100 MG injection 100 mg   . dextrose 5 %-0.9 %  sodium chloride infusion   . lithium carbonate capsule 600 mg   . metoprolol tartrate (LOPRESSOR) injection 2.5 mg   . metoprolol succinate (TOPROL-XL) 24 hr tablet 12.5 mg   . metoprolol tartrate (LOPRESSOR) injection 2.5 mg   . diltiazem (CARDIZEM) 100 mg in dextrose 5 % 100 mL (1 mg/mL) infusion   . diltiazem (CARDIZEM) 100 mg in dextrose 5 % 100 mL (1 mg/mL) infusion   . risperiDONE microspheres (RISPERDAL CONSTA) injection 50 mg     Social History:  reports that  has never smoked. he has never used smokeless tobacco. He reports that he does not drink alcohol or use drugs.  Family History:  History reviewed. No pertinent family history.  Review of systems not obtained due to patient factors.  Blood pressure 119/77, pulse 96, temperature 98.8 F (37.1 C), temperature source Oral, resp. rate 17, height 5\' 9"  (1.753 m), weight 75.8 kg (167 lb 1.7 oz), SpO2 96 %. General appearance: distracted and slowed mentation Head: Normocephalic, without obvious abnormality, atraumatic Resp: clear to auscultation bilaterally Cardio: regular rate and rhythm, S1, S2 normal, no murmur, click, rub or gallop GI: +BS, distended/tympanic with suprapubic fullness Extremities: extremities normal,  atraumatic, no cyanosis or edema  Labs: Basic Metabolic Panel: Recent Labs  Lab 03/31/17 0618 04/01/17 0137 04/02/17 0240 04/03/17 0816 04/03/17 1345 04/04/17 0225 04/05/17 0243 04/06/17 0403  NA 146* 147* 148* 155* 152* 153* 153* 159*  K 2.9* 4.1 4.0 3.6 3.4* 3.4* 3.4* 3.7  CL 117* 118* 119* 119* 119* 120* 118* 122*  CO2 24 25 24 31 30 30 31 30   GLUCOSE 102* 107* 100* 78 99 104* 103* 92  BUN 13 13 13 10 9 7 6 11   CREATININE 1.21 1.45* 1.28* 1.18 1.21 1.09 1.11 1.22  ALBUMIN 2.2* 2.3* 2.2* 2.6*  --  2.2* 2.3*  --   CALCIUM 9.0 9.4 9.1 9.8 9.7 9.4 9.3 9.4  PHOS 2.9  --  3.2  --   --  3.6 2.9  --    Liver Function Tests: Recent Labs  Lab 04/01/17 0137  04/03/17 0816 04/04/17 0225 04/05/17 0243  AST 21  --  16  --   --   ALT 24  --  20  --   --   ALKPHOS 27*  --  28*  --   --   BILITOT 0.9  --  0.4  --   --   PROT 5.4*  --  5.7*  --   --   ALBUMIN 2.3*   < > 2.6* 2.2* 2.3*   < > = values in this interval not displayed.   No results for input(s): LIPASE, AMYLASE in the last 168 hours. No results for input(s): AMMONIA in the last 168 hours. CBC: Recent Labs  Lab 04/03/17 0816 04/04/17 0225 04/05/17 0243 04/06/17 0403  WBC 19.5* 17.7* 13.5* 13.2*  NEUTROABS 15.5* 14.6* 9.6* 9.6*  HGB 12.8* 11.5* 11.4* 11.2*  HCT 41.2 37.7* 37.1* 37.3*  MCV 94.7 94.5 95.4 95.6  PLT 57* 52* 62* 96*   PT/INR: @labrcntip (inr:5) Cardiac Enzymes: No results for input(s): CKTOTAL, CKMB, CKMBINDEX, TROPONINI in the last 168 hours. CBG: Recent Labs  Lab 04/05/17 2149 04/06/17 0242 04/06/17 0453 04/06/17 0844 04/06/17 1143  GLUCAP 119* 111* 110* 86 94    Iron Studies: No results for input(s): IRON, TIBC, TRANSFERRIN, FERRITIN in the last 168 hours.  Xrays/Other Studies: Dg Swallowing Func-speech Pathology  Result Date: 04/05/2017 Objective Swallowing Evaluation: Type of Study: MBS-Modified  Barium Swallow Study  Patient Details Name: Abdullahi Vallone MRN: [id number] Date of  Birth: [birth year] Today's Date: 04/05/2017 Time: SLP Start Time (ACUTE ONLY): 2595 -SLP Stop Time (ACUTE ONLY): 0915 SLP Time Calculation (min) (ACUTE ONLY): 12 min Past Medical History: Past Medical History: Diagnosis Date . ADD (attention deficit disorder)  . Anxiety  . Asthma  . Depression  . HLD (hyperlipidemia)  . Prostate cancer ([place])  . Schizoaffective disorder ([place])  . Seasonal allergies  Past Surgical History: Past Surgical History: Procedure Laterality Date . ESOPHAGOGASTRODUODENOSCOPY (EGD) WITH PROPOFOL N/A 04/16/2015  Procedure: ESOPHAGOGASTRODUODENOSCOPY (EGD) WITH PROPOFOL;  Surgeon: Josefine Class, MD;  Location: [facility] ENDOSCOPY;  Service: Endoscopy;  Laterality: N/A; . NO PAST SURGERIES   HPI: 41 year old male living in a group home with a history of ADD, anxiety, asthma, depression, schizoaffective disorder from a group home who presented to the ED with AMS and lethargy. ED visit on 11/16 for evaluation of urinary frequency and being off balance.CT no acute abnormality and pt discharged. Chest x-ray revealed patchy bibasilar airspace opacification is worrisome for pneumonia. MBS 11/20 recommended Dys 2, thin liquids, aspiration event suspected and transferred to ICU; wet vocal quality and congestion and SLP initiated honey thick liquids then downgraded to nectar. Repeat MBS today assess current swallow function and recommend safest  po's prior to discharge.  No Data Recorded Assessment / Plan / Recommendation CHL IP CLINICAL IMPRESSIONS 04/05/2017 Clinical Impression Pt exhibited similar oropharyngeal swallow ability as prior MBS 11/201/18 although distratcted first half study with need to urinate. As noted prior, when he is internally distracted by pain/discomfort, pt demonstrates oral holding, transit delays, head extention and spastic muscle response due to internal distractions. Study paused; pt attempted to use urinal with subsequent improvements in pt's focus/attention and increase in oral  transit time. Pharyngeal phase marked by intermittent swallow initiation at his pyriform sinsuses completely filling before swallow triggered. Min-mild vallecular/pyriform sinsus residue beginning to spill from pyriform sinus before initiating swallow. No significant barium observed in vestibule or trachea during study although pt's oral impairments, cognition and waxing/waning episodes of pain/inattention increase aspiration risk, therefore recommend pt to remain on nectar thick liquids and Dys 2 texture with Speech Therapy follow up via home health once back at group home.  SLP Visit Diagnosis Dysphagia, oropharyngeal phase (R13.12) Attention and concentration deficit following -- Frontal lobe and executive function deficit following -- Impact on safety and function Moderate aspiration risk   CHL IP TREATMENT RECOMMENDATION 04/05/2017 Treatment Recommendations Therapy as outlined in treatment plan below   Prognosis 04/05/2017 Prognosis for Safe Diet Advancement (No Data) Barriers to Reach Goals Cognitive deficits Barriers/Prognosis Comment -- CHL IP DIET RECOMMENDATION 04/05/2017 SLP Diet Recommendations Dysphagia 2 (Fine chop) solids;Nectar thick liquid Liquid Administration via Straw;Cup Medication Administration Crushed with puree Compensations Minimize environmental distractions;Slow rate;Small sips/bites;Clear throat intermittently Postural Changes Seated upright at 90 degrees   CHL IP OTHER RECOMMENDATIONS 04/05/2017 Recommended Consults -- Oral Care Recommendations Oral care BID Other Recommendations --   CHL IP FOLLOW UP RECOMMENDATIONS 04/05/2017 Follow up Recommendations 24 hour supervision/assistance   CHL IP FREQUENCY AND DURATION 04/05/2017 Speech Therapy Frequency (ACUTE ONLY) min 2x/week Treatment Duration 2 weeks      CHL IP ORAL PHASE 04/05/2017 Oral Phase Impaired Oral - Pudding Teaspoon -- Oral - Pudding Cup -- Oral - Honey Teaspoon -- Oral - Honey Cup NT Oral - Nectar Teaspoon -- Oral - Nectar  Cup NT Oral - Nectar Straw -- Oral - Thin Teaspoon --  Oral - Thin Cup Lingual pumping;Holding of bolus Oral - Thin Straw Lingual pumping;Holding of bolus Oral - Puree Delayed oral transit Oral - Mech Soft -- Oral - Regular Reduced posterior propulsion;Delayed oral transit Oral - Multi-Consistency -- Oral - Pill -- Oral Phase - Comment --  CHL IP PHARYNGEAL PHASE 04/05/2017 Pharyngeal Phase Impaired Pharyngeal- Pudding Teaspoon -- Pharyngeal -- Pharyngeal- Pudding Cup -- Pharyngeal -- Pharyngeal- Honey Teaspoon -- Pharyngeal -- Pharyngeal- Honey Cup NT Pharyngeal -- Pharyngeal- Nectar Teaspoon -- Pharyngeal -- Pharyngeal- Nectar Cup NT Pharyngeal -- Pharyngeal- Nectar Straw NT Pharyngeal -- Pharyngeal- Thin Teaspoon -- Pharyngeal -- Pharyngeal- Thin Cup Delayed swallow initiation-pyriform sinuses Pharyngeal -- Pharyngeal- Thin Straw Delayed swallow initiation-pyriform sinuses;Pharyngeal residue - valleculae;Pharyngeal residue - pyriform;Penetration/Apiration after swallow Pharyngeal Material enters airway, remains ABOVE vocal cords then ejected out Pharyngeal- Puree WFL Pharyngeal -- Pharyngeal- Mechanical Soft -- Pharyngeal -- Pharyngeal- Regular Pharyngeal residue - valleculae;Reduced epiglottic inversion Pharyngeal -- Pharyngeal- Multi-consistency -- Pharyngeal -- Pharyngeal- Pill -- Pharyngeal -- Pharyngeal Comment --  CHL IP CERVICAL ESOPHAGEAL PHASE 04/05/2017 Cervical Esophageal Phase WFL Pudding Teaspoon -- Pudding Cup -- Honey Teaspoon -- Honey Cup -- Nectar Teaspoon -- Nectar Cup -- Nectar Straw -- Thin Teaspoon -- Thin Cup -- Thin Straw -- Puree -- Mechanical Soft -- Regular -- Multi-consistency -- Pill -- Cervical Esophageal Comment -- No flowsheet data found. [name] 04/05/2017, 10:17 AM [name] CCC-SLP Pager [phone]              Other labs:  Serum osmolality- 329, urine osmolality 194, UNa 49, [place] 12  Assessment/Plan: 1.  Hypernatremia- pt with initial presentation of  volume depletion and improved with IVF's, however sodium has increased steadily since 03/31/17.  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  Agree with holding lithium and will recheck sodium level today and start amiloride 10mg  daily.  Will also need low sodium and low protein diet.  Will follow sodium levels and UOP.  Given the severity of his NDI, he may not respond to amiloride so HCTZ and/or indomethacin may be tried if no significant improvement is seen. 2. Abdominal distention- concerning for bladder outlet obstruction (recently had foley removed).  Will order bladder scan and if >300cc I&O cath. 3. Aspiration pneumonia- on augmentin per primary 4. Schizoaffective disorder- agree with holding lithium and cont with depakote, ativan, risperidone per primary. 5. Dysphagia- cont with thickened liquids. 6. CKD stage 2 likely due to chronic lithium use and toxicity.   Governor Rooks Malick Netz 04/06/2017, 3:52 PM

## 2017-04-06 NOTE — Progress Notes (Signed)
Bladder scanned for greater 182cc,  In an out cath for 200cc clear urine.

## 2017-04-06 NOTE — Evaluation (Addendum)
Occupational Therapy Evaluation Patient Details Name: Shane Gentry MRN: [id number] DOB: 04/16/1976 Today's Date: 04/06/2017    History of Present Illness This 41 y.o. male admitted from his group home with AMS. Pt found to have sepsis due to UTI.   X-ray showed PNA, likely due to aspiration.  PMH includes:  MR + schizoaffective disorder, h/o SBO, chronic kidney disease   Clinical Impression   Pt admitted with above. He demonstrates the below listed deficits and will benefit from continued OT to maximize safety and independence with BADLs.  Pt presents to OT with impaired balance, decreased activity tolerance, generalized weakness, as well as impaired cognition.  He currently requires supervision - min A for UB ADLs an d mod A for LBs, as well as min - mod A for functional transfers due to impaired balance - he loses his balance posteriorly without initiating corrective responses.  PTA, he lived in group home, and per his report, was mod I with ADLs and functional mobility ( no caregiver present to confirm).  At discharge, he will need assistance every time is up on his feed as he is at high risk for a fall.   Unsure if his group home can provide this level of assist (min - mod A).  If they are unable to do this, then he will need SNF.  IF he able to discharge  to the group home, he will need follow up HHOT/PT.        Follow Up Recommendations  Home health OT;SNF;Supervision/Assistance - 24 hour  Unsure level of assist group home able to provide    Equipment Recommendations  Tub/shower seat;3 in 1 bedside commode.  Possible w/c if he discharges to group home    Recommendations for Other Services       Precautions / Restrictions Precautions Precautions: Fall Precaution Comments: Posterior LOB in standing, no corrective responses noted       Mobility Bed Mobility Overal bed mobility: Needs Assistance Bed Mobility: Supine to Sit     Supine to sit: Supervision;HOB elevated         Transfers Overall transfer level: Needs assistance   Transfers: Sit to/from Stand;Stand Pivot Transfers Sit to Stand: Min assist;+2 safety/equipment Stand pivot transfers: Mod assist;+2 safety/equipment       General transfer comment: assist for balance     Balance                                           ADL either performed or assessed with clinical judgement   ADL Overall ADL's : Needs assistance/impaired Eating/Feeding: Supervision/ safety;Minimal assistance;Bed level;Sitting Eating/Feeding Details (indicate cue type and reason): requires prompting and assist for some food items - anticipate this is behavioral.  He was able to self feed ice cream with supervision for dysphagia precautions  Grooming: Wash/dry face;Set up;Supervision/safety;Sitting   Upper Body Bathing: Minimal assistance;Sitting   Lower Body Bathing: Moderate assistance;Sit to/from stand   Upper Body Dressing : Minimal assistance;Sitting   Lower Body Dressing: Moderate assistance;Sit to/from stand   Toilet Transfer: Moderate assistance;+2 for safety/equipment;Ambulation;Comfort height toilet;BSC;Grab bars   Toileting- Clothing Manipulation and Hygiene: Moderate assistance;Sit to/from stand       Functional mobility during ADLs: Moderate assistance;+2 for safety/equipment General ADL Comments: assist due to balance deficits      Vision Patient Visual Report: No change from baseline  Perception     Praxis      Pertinent Vitals/Pain Pain Assessment: No/denies pain     Hand Dominance Right   Extremity/Trunk Assessment Upper Extremity Assessment Upper Extremity Assessment: RUE deficits/detail;LUE deficits/detail RUE Deficits / Details: Pt with what appears to be mild edema Lt hand.  Pt maintains wrists flexed with hands in partially flexed position.  He requires prompting to use hands during functional activities - often requests assistance  RUE Coordination:  decreased fine motor LUE Deficits / Details: Pt with what appears to be mild edema Lt hand.  Pt maintains wrists flexed with hands in partially flexed position.  He requires prompting to use hands during functional activities - often requests assistance  LUE Coordination: decreased fine motor   Lower Extremity Assessment Lower Extremity Assessment: Defer to PT evaluation   Cervical / Trunk Assessment Cervical / Trunk Assessment: Normal   Communication Communication Communication: Expressive difficulties   Cognition Arousal/Alertness: Awake/alert Behavior During Therapy: WFL for tasks assessed/performed Overall Cognitive Status: No family/caregiver present to determine baseline cognitive functioning                                     General Comments  VSS     Exercises     Shoulder Instructions      Home Living Family/patient expects to be discharged to:: Group home Living Arrangements: Group Home Available Help at Discharge: Personal care attendant Type of Home: Group Home       Home Layout: One level     Bathroom Shower/Tub: Walk-in shower                    Prior Functioning/Environment Level of Independence: Independent        Comments: Pt indicates he is fully independent with ADLs and ambulation without AD, however, no caregiver present to confirm info         OT Problem List: Decreased strength;Decreased activity tolerance;Impaired balance (sitting and/or standing);Decreased coordination;Decreased cognition;Decreased safety awareness;Decreased knowledge of use of DME or AE;Impaired UE functional use      OT Treatment/Interventions: Self-care/ADL training;DME and/or AE instruction;Therapeutic activities;Cognitive remediation/compensation;Patient/family education;Balance training    OT Goals(Current goals can be found in the care plan section) Acute Rehab OT Goals OT Goal Formulation: Patient unable to participate in goal  setting Time For Goal Achievement: 04/20/17 Potential to Achieve Goals: Good ADL Goals Pt Will Perform Grooming: with min guard assist;standing Pt Will Perform Upper Body Bathing: with supervision;sitting Pt Will Perform Lower Body Bathing: with min assist;sit to/from stand Pt Will Perform Upper Body Dressing: with supervision;sitting Pt Will Perform Lower Body Dressing: with min assist;sit to/from stand Pt Will Transfer to Toilet: with min assist;ambulating;regular height toilet;bedside commode;grab bars Pt Will Perform Toileting - Clothing Manipulation and hygiene: with min guard assist;sit to/from stand  OT Frequency: Min 2X/week   Barriers to D/C:    Unsure if group home can provide necessary level of care        Co-evaluation PT/OT/SLP Co-Evaluation/Treatment: Yes Reason for Co-Treatment: For patient/therapist safety   OT goals addressed during session: ADL's and self-care      AM-PAC PT "6 Clicks" Daily Activity     Outcome Measure Help from another person eating meals?: A Little Help from another person taking care of personal grooming?: A Little Help from another person toileting, which includes using toliet, bedpan, or urinal?: A Lot Help from another person  bathing (including washing, rinsing, drying)?: A Lot Help from another person to put on and taking off regular upper body clothing?: A Lot Help from another person to put on and taking off regular lower body clothing?: A Lot 6 Click Score: 14   End of Session Equipment Utilized During Treatment: Gait belt Nurse Communication: Mobility status  Activity Tolerance: Patient tolerated treatment well Patient left: in chair;with chair alarm set;with nursing/sitter in room  OT Visit Diagnosis: Unsteadiness on feet (R26.81)                Time: 8648-4720 OT Time Calculation (min): 30 min Charges:  OT General Charges $OT Visit: 1 Visit OT Evaluation $OT Eval Moderate Complexity: 1 Mod G-Codes:     [facility],  OTR/L [phone]   Lucille Passy M 04/06/2017, 3:35 PM

## 2017-04-06 NOTE — Progress Notes (Signed)
CSW called Jenny Reichmann at [facility], where patient is a resident, and discussed SNF recommendations. Cindy deferred to patient's legal guardian, Danae Chen at [employer]. CSW also discussed recommendations with Danae Chen. Erica to determine with group home staff their ability to manage patient's needs at this time. CSW to follow and support with disposition planning.  Estanislado Emms, [place]

## 2017-04-07 LAB — GLUCOSE, CAPILLARY
GLUCOSE-CAPILLARY: 94 mg/dL (ref 65–99)
GLUCOSE-CAPILLARY: 75 mg/dL (ref 65–99)
Glucose-Capillary: 88 mg/dL (ref 65–99)

## 2017-04-07 LAB — BASIC METABOLIC PANEL
Anion gap: 5 (ref 5–15)
BUN: 10 mg/dL (ref 6–20)
CO2: 29 mmol/L (ref 22–32)
Calcium: 9.1 mg/dL (ref 8.9–10.3)
Chloride: 116 mmol/L — ABNORMAL HIGH (ref 101–111)
Creatinine, Ser: 1.13 mg/dL (ref 0.61–1.24)
Glucose, Bld: 98 mg/dL (ref 65–99)
POTASSIUM: 3.6 mmol/L (ref 3.5–5.1)
SODIUM: 150 mmol/L — AB (ref 135–145)

## 2017-04-07 LAB — CBC WITH DIFFERENTIAL/PLATELET
BASOS ABS: 0 10*3/uL (ref 0.0–0.1)
BASOS PCT: 0 %
EOS ABS: 0 10*3/uL (ref 0.0–0.7)
EOS PCT: 0 %
HCT: 35.9 % — ABNORMAL LOW (ref 39.0–52.0)
Hemoglobin: 11 g/dL — ABNORMAL LOW (ref 13.0–17.0)
LYMPHS ABS: 3.1 10*3/uL (ref 0.7–4.0)
Lymphocytes Relative: 19 %
MCH: 29.3 pg (ref 26.0–34.0)
MCHC: 30.6 g/dL (ref 30.0–36.0)
MCV: 95.5 fL (ref 78.0–100.0)
Monocytes Absolute: 1.7 10*3/uL — ABNORMAL HIGH (ref 0.1–1.0)
Monocytes Relative: 10 %
Neutro Abs: 11.5 10*3/uL — ABNORMAL HIGH (ref 1.7–7.7)
Neutrophils Relative %: 71 %
PLATELETS: 123 10*3/uL — AB (ref 150–400)
RBC: 3.76 MIL/uL — AB (ref 4.22–5.81)
RDW: 16.8 % — ABNORMAL HIGH (ref 11.5–15.5)
WBC: 16.3 10*3/uL — AB (ref 4.0–10.5)

## 2017-04-07 LAB — OSMOLALITY: Osmolality: 317 mOsm/kg — ABNORMAL HIGH (ref 275–295)

## 2017-04-07 LAB — SODIUM, URINE, RANDOM: Sodium, Ur: 73 mmol/L

## 2017-04-07 LAB — OSMOLALITY, URINE: OSMOLALITY UR: 220 mosm/kg — AB (ref 300–900)

## 2017-04-07 MED ORDER — AMOXICILLIN-POT CLAVULANATE 400-57 MG/5ML PO SUSR
875.0000 mg | Freq: Two times a day (BID) | ORAL | Status: DC
  Administered 2017-04-07 – 2017-04-11 (×9): 872 mg via ORAL
  Filled 2017-04-07 (×11): qty 10.9

## 2017-04-07 MED ORDER — PREDNISONE 20 MG PO TABS: 20.0000 mg | ORAL_TABLET | Freq: Every day | ORAL | Status: DC

## 2017-04-07 MED ORDER — PREDNISONE 20 MG PO TABS
20.0000 mg | ORAL_TABLET | Freq: Every day | ORAL | Status: DC
  Administered 2017-04-08 – 2017-04-09 (×2): 20 mg via ORAL
  Filled 2017-04-07 (×2): qty 1

## 2017-04-07 MED ORDER — DILTIAZEM 12 MG/ML ORAL SUSPENSION
60.0000 mg | Freq: Three times a day (TID) | ORAL | Status: DC
  Administered 2017-04-07 – 2017-04-13 (×18): 60 mg via ORAL
  Filled 2017-04-07 (×21): qty 6

## 2017-04-07 NOTE — NC FL2 (Signed)
[place] MEDICAID FL2 LEVEL OF CARE SCREENING TOOL     IDENTIFICATION  Patient Name: Shane Gentry Birthdate: 25-Jun-1975 Sex: male Admission Date (Current Location): 03/27/2017  [facility] and [place] Number:  Herbalist and Address:  [facility]. [facility], [place] [address] [place], [place] [address]      Provider Number: [id number]  Attending Physician Name and Address:  Alma Friendly, MD  Relative Name and Phone Number:  Danae Chen, legal guardian with [place], [phone]    Current Level of Care: Hospital Recommended Level of Care: [place] Prior Approval Number:    Date Approved/Denied:   PASRR Number:    Discharge Plan: SNF    Current Diagnoses: Patient Active Problem List   Diagnosis Date Noted  . Aspiration pneumonia ([place]) 04/05/2017  . Sepsis secondary to UTI ([place]) 03/27/2017  . Sepsis ([place]) 03/27/2017  . Aspiration pneumonitis ([place])   . SBO (small bowel obstruction) ([place]) 09/24/2015  . Hyperthyroidism 02/24/2015  . Failure to thrive in adult 02/19/2015  . Schizoaffective disorder, depressive type ([place]) 02/18/2015  . Elevated lithium level 02/18/2015  . Constipation 02/18/2015  . Absence of bladder continence 02/06/2015  . Meatal stenosis 02/06/2015    Orientation RESPIRATION BLADDER Height & Weight     Self  Normal Incontinent, External catheter Weight: 166 lb (75.3 kg) Height:  5\' 9"  (175.3 cm)  BEHAVIORAL SYMPTOMS/MOOD NEUROLOGICAL BOWEL NUTRITION STATUS      Incontinent Diet(Dysphagia II)  AMBULATORY STATUS COMMUNICATION OF NEEDS Skin   Limited Assist Verbally Normal                       Personal Care Assistance Level of Assistance  Bathing, Feeding, Dressing Bathing Assistance: Limited assistance Feeding assistance: Limited assistance Dressing Assistance: Limited assistance     Functional Limitations Info  Sight, Hearing, Speech Sight Info: Adequate Hearing Info:  Adequate Speech Info: Adequate    SPECIAL CARE FACTORS FREQUENCY  PT (By licensed PT), OT (By licensed OT)     PT Frequency: 5x/week OT Frequency: 5x/week            Contractures Contractures Info: Not present    Additional Factors Info  Code Status, Allergies Code Status Info: Full Allergies Info: Sulfa Antibiotics           Current Medications (04/07/2017):  This is the current hospital active medication list Current Facility-Administered Medications  Medication Dose Route Frequency Provider Last Rate Last Dose  . aMILoride (MIDAMOR) tablet 10 mg  10 mg Oral Daily Donato Heinz, MD   10 mg at 04/07/17 1015  . amoxicillin-clavulanate (AUGMENTIN) 400-57 MG/5ML suspension 872 mg  875 mg of amoxicillin Oral Q12H Alma Friendly, MD   872 mg at 04/07/17 1451  . benzonatate (TESSALON) capsule 100 mg  100 mg Oral Q8H Samtani, Jai-Gurmukh, MD   100 mg at 04/07/17 1016  . dextrose 5 % solution   Intravenous Continuous Alma Friendly, MD 250 mL/hr at 04/07/17 0909    . diltiazem (CARDIZEM) 10 mg/ml oral suspension 60 mg  60 mg Oral Q8H Alma Friendly, MD   60 mg at 04/07/17 1221  . free water 200 mL  200 mL Oral Q8H Samtani, Jai-Gurmukh, MD   200 mL at 04/07/17 1531  . heparin injection 5,000 Units  5,000 Units Subcutaneous Q8H Reyne Dumas, MD   5,000 Units at 04/07/17 0647  . levalbuterol (XOPENEX) nebulizer solution 0.63 mg  0.63 mg  Nebulization Q6H PRN Reyne Dumas, MD      . LORazepam (ATIVAN) tablet 2 mg  2 mg Oral BID Nita Sells, MD   2 mg at 04/07/17 1015  . montelukast (SINGULAIR) tablet 10 mg  10 mg Oral QHS Reyne Dumas, MD   10 mg at 04/06/17 2218  . ondansetron (ZOFRAN) injection 4 mg  4 mg Intravenous Q6H PRN Reyne Dumas, MD      . Derrill Memo ON 04/08/2017] predniSONE (DELTASONE) tablet 20 mg  20 mg Oral Q breakfast Alma Friendly, MD      . RESOURCE THICKENUP CLEAR   Oral PRN Nita Sells, MD      . risperiDONE  microspheres (RISPERDAL CONSTA) injection 50 mg  50 mg Intramuscular Q14 Days Alma Friendly, MD      . temazepam (RESTORIL) capsule 30 mg  30 mg Oral QHS Nita Sells, MD   30 mg at 04/06/17 2219  . Valproate Sodium (DEPAKENE) solution 250 mg  250 mg Oral Q6H Bodenheimer, Charles A, NP   250 mg at 04/07/17 1451     Discharge Medications: Please see discharge summary for a list of discharge medications.  Relevant Imaging Results:  Relevant Lab Results:   Additional Information SSN: [id number]  Estanislado Emms, LCSW

## 2017-04-07 NOTE — Progress Notes (Signed)
PROGRESS NOTE  Shane Gentry MVE:[id number] DOB: 1976/02/07 DOA: 03/27/2017 PCP: [name], MD  HPI/Recap of past 67 hours: 41 year old long-term resident of group home with hx of prior SBO, HTN, HLD, PAF, failure to thrive, hypothyroidism, schizoaffective disorder + MR, prior dysphagia, severe chronic constipation, presented from group home with altered mental status, cough. On arrival 11/19 to emergency room core temperature of 88 patient was given warm fluids then chest x-ray revealed bilateral infiltrates suggestive of pneumonia, glucose was 60 patient received 2-1/2 L normal saline for presumed aspiration pneumonia. Pt had recurrent aspiration and had to go to the ICU on 11/23, however has improved but currently still on AB.  Today, reported feeling well. Pt denies any chest pain, SOB, abdominal pain, N/V/D/C, fever/chills. Very good appetite   Assessment/Plan: Principal Problem:   Aspiration pneumonitis (HCC) Active Problems:   Schizoaffective disorder, depressive type ([place])   Failure to thrive in adult   Hyperthyroidism   Sepsis secondary to UTI ([place])   Sepsis ([place])   Aspiration pneumonia ([place])  #Recurrent aspiration pneumonia Improving Afebrile, resolving leukocytosis, not requiring O2 SLP has eva speech therapy evaluated recommend DYS 2 nectar thick Was de-escalated to po levaquin 11/22 D/C vanc/zosyn on 11/23-tapered to Augmentin 11/25 Bc x 2 NGTD, pct lower D/C solu cortef 100 q8-->50 q12, titrated down to Prednisone 60m daily, will wean off  #Nephrogenic diabetes insipidus Hypernatremia, polyuria, lithium use Na 159 on 04/06/17, trending down Nephrology consulted: rec increasing D5W, starting amiloride 169mdaily. 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  Resolved,  baseline creatinine anywhere from 1.0-1.2 ?nephrogenic induced Lithium DI BUN/creatinine better but still hypernatremic Daily BMP  #P.Aflutter Controlled S/P Cardizem gtt [facility]: LVEF 65-70%, Grade 1DD Cardiology on board: Continue PO diltiazem, [facility]#Persistent hypoglycemia Resolved Etiology is unclear Because of his persistent hypoglycemia we will rule out rare causes such as insulinoma nursing order has been placed for insulin level, C-peptide, beta-hydroxybutyrate and proinsulin levels Follow up as an outpt  #Acute hypoxic resp failure Resolved Probably 2/2 pna CXR 11/25 shows fluid infection Repeat chest x-ray 1 view 11/26 with unchanged infiltrates suggestive of PNA  PRN O2  #Met encephalopathy Resolving 2/2 to sepsis vs hypoglycemia Fluid now just d5W  #Hypothyroidism TSH slightly elevated at 7.7, likely due to acute illness Outpt follow up required  #Affective disorder, MR reported Stable Resuming home meds 11/24 Risperdal Consta 50 Q 14--ordered for 11/29 temazepam 30 at bedtime, Depakote 1000 at bedtime  lorazepam 2 mg twice daily Hold lithium 1 capsule at bedtime due to diabetes insipidus  #HTN holding metoprolol XL 25 due to soft BP  #Hyperlipidemia  Continue atorvastatin 20  #Chronic severe constipation  Continue MiraLAX 17 g daily ensure proper bowel regimen  #Thrombocytopenia, unclear cause [facility] but no information currently available at this time DIC panel is negative think this is probably just medication related    Code Status: Full  Family Communication: None at bedside  Disposition Plan: Possibly back to NH by 04/09/17   Consultants:  Nephrology  Procedures:  None   Antimicrobials:  PO Augmentin  DVT prophylaxis:  Heparin [name]   Objective: Vitals:   04/06/17 1840 04/07/17 0619 04/07/17 1007 04/07/17 1346  BP: 124/72 (!Marland Kitchen  118/59 122/64 124/69  Pulse: 90 (!) 105 87 89  Resp: 18 18 18 18   Temp: 98.6 F (37 C) 99.8 F (37.7 C) 98.2 F (36.8 C) 97.9 F (36.6 C)  TempSrc: Oral Oral Oral Oral  SpO2: 98% 97% 98% 96%  Weight: 74.7 kg (164 lb 9.6 oz) 75.3 kg (166 lb)    Height: 5' 9"  (1.753 m)       Intake/Output Summary (Last 24 hours) at 04/07/2017 1520 Last data filed at 04/07/2017 1349 Gross per 24 hour  Intake 3452.92 ml  Output 4450 ml  Net -997.08 ml   Filed Weights   04/05/17 0341 04/06/17 1840 04/07/17 0619  Weight: 75.8 kg (167 lb 1.7 oz) 74.7 kg (164 lb 9.6 oz) 75.3 kg (166 lb)    Exam:   General: alert, awake, oriented to self  Cardiovascular: S1-S2 present, no added hrt sound  Respiratory: Chest clear bilaterally  Abdomen: Soft, non tender, non-distended, BS present  Musculoskeletal: No bilateral pedal edema  Skin: Normal  Psychiatry: Normal mood   Data Reviewed: CBC: Recent Labs  Lab 04/03/17 0816 04/04/17 0225 04/05/17 0243 04/06/17 0403 04/07/17 0638  WBC 19.5* 17.7* 13.5* 13.2* 16.3*  NEUTROABS 15.5* 14.6* 9.6* 9.6* 11.5*  HGB 12.8* 11.5* 11.4* 11.2* 11.0*  HCT 41.2 37.7* 37.1* 37.3* 35.9*  MCV 94.7 94.5 95.4 95.6 95.5  PLT 57* 52* 62* 96* 226*   Basic Metabolic Panel: Recent Labs  Lab 04/02/17 0240 04/02/17 1232 04/03/17 0816  04/04/17 0225 04/05/17 0243 04/06/17 0403 04/06/17 1604 04/07/17 0638  NA 148*  --  155*   < > 153* 153* 159* 151* 150*  K 4.0  --  3.6   < > 3.4* 3.4* 3.7 3.4* 3.6  CL 119*  --  119*   < > 120* 118* 122* 118* 116*  CO2 24  --  31   < > 30 31 30 28 29   GLUCOSE 100*  --  78   < > 104* 103* 92 128* 98  BUN 13  --  10   < > 7 6 11 10 10   CREATININE 1.28*  --  1.18   < > 1.09 1.11 1.22 1.18 1.13  CALCIUM 9.1  --  9.8   < > 9.4 9.3 9.4 9.2 9.1  MG  --  2.3 2.1  --   --   --   --   --   --   PHOS 3.2  --   --   --  3.6 2.9  --  3.2  --    < > = values in this interval not displayed.   GFR: Estimated Creatinine Clearance: 84.3  mL/min (by C-G formula based on SCr of 1.13 mg/dL). Liver Function Tests: Recent Labs  Lab 04/01/17 0137 04/02/17 0240 04/03/17 0816 04/04/17 0225 04/05/17 0243 04/06/17 1604  AST 21  --  16  --   --   --   ALT 24  --  20  --   --   --   ALKPHOS 27*  --  28*  --   --   --   BILITOT 0.9  --  0.4  --   --   --   PROT 5.4*  --  5.7*  --   --   --   ALBUMIN 2.3* 2.2* 2.6* 2.2* 2.3* 2.6*   No results for input(s): LIPASE, AMYLASE in the last 168 hours. No results for input(s): AMMONIA in the last 168 hours. Coagulation Profile:  No results for input(s): INR, PROTIME in the last 168 hours. Cardiac Enzymes: No results for input(s): CKTOTAL, CKMB, CKMBINDEX, TROPONINI in the last 168 hours. BNP (last 3 results) No results for input(s): PROBNP in the last 8760 hours. HbA1C: No results for input(s): HGBA1C in the last 72 hours. CBG: Recent Labs  Lab 04/06/17 1143 04/06/17 1604 04/06/17 2000 04/06/17 2330 04/07/17 0630  GLUCAP 94 136* 124* 84 94   Lipid Profile: No results for input(s): CHOL, HDL, LDLCALC, TRIG, CHOLHDL, LDLDIRECT in the last 72 hours. Thyroid Function Tests: No results for input(s): TSH, T4TOTAL, FREET4, T3FREE, THYROIDAB in the last 72 hours. Anemia Panel: No results for input(s): VITAMINB12, FOLATE, FERRITIN, TIBC, IRON, RETICCTPCT in the last 72 hours. Urine analysis:    Component Value Date/Time   COLORURINE YELLOW 03/27/2017 2226   APPEARANCEUR CLEAR 03/27/2017 2226   APPEARANCEUR Clear 02/04/2015 0940   LABSPEC 1.011 03/27/2017 2226   PHURINE 5.0 03/27/2017 2226   GLUCOSEU NEGATIVE 03/27/2017 2226   HGBUR NEGATIVE 03/27/2017 2226   BILIRUBINUR NEGATIVE 03/27/2017 2226   BILIRUBINUR Negative 02/04/2015 0940   KETONESUR NEGATIVE 03/27/2017 2226   PROTEINUR NEGATIVE 03/27/2017 2226   UROBILINOGEN 1.0 02/04/2015 1631   NITRITE NEGATIVE 03/27/2017 2226   LEUKOCYTESUR NEGATIVE 03/27/2017 2226   LEUKOCYTESUR Negative 02/04/2015 0940   Sepsis  Labs: @LABRCNTIP (procalcitonin:4,lacticidven:4)  ) Recent Results (from the past 240 hour(s))  Culture, blood (routine x 2)     Status: None   Collection Time: 03/31/17  8:58 AM  Result Value Ref Range Status   Specimen Description BLOOD LEFT ANTECUBITAL  Final   Special Requests   Final    BOTTLES DRAWN AEROBIC ONLY Blood Culture adequate volume   Culture NO GROWTH 5 DAYS  Final   Report Status 04/05/2017 FINAL  Final  Culture, blood (routine x 2)     Status: None   Collection Time: 03/31/17  9:05 AM  Result Value Ref Range Status   Specimen Description BLOOD LEFT FOREARM  Final   Special Requests   Final    BOTTLES DRAWN AEROBIC ONLY Blood Culture adequate volume   Culture NO GROWTH 5 DAYS  Final   Report Status 04/05/2017 FINAL  Final      Studies: No results found.  Scheduled Meds: . aMILoride  10 mg Oral Daily  . amoxicillin-clavulanate  875 mg of amoxicillin Oral Q12H  . benzonatate  100 mg Oral Q8H  . diltiazem  60 mg Oral Q8H  . free water  200 mL Oral Q8H  . heparin  5,000 Units Subcutaneous Q8H  . LORazepam  2 mg Oral BID  . montelukast  10 mg Oral QHS  . predniSONE  40 mg Oral Q breakfast  . risperiDONE microspheres  50 mg Intramuscular Q14 Days  . temazepam  30 mg Oral QHS  . Valproate Sodium  250 mg Oral Q6H    Continuous Infusions: . dextrose 250 mL/hr at 04/07/17 0488     LOS: 11 days     Alma Friendly, MD [facility]  If 7PM-7AM, please contact night-coverage [email or web address] Password [email or web address] 04/07/2017, 3:20 PM

## 2017-04-07 NOTE — Progress Notes (Signed)
Assessment/Plan: 1.  Hypernatremia- due to NDI 2. Aspiration pneumonia- on augmentin per primary 3. Schizoaffective disorder- agree with holding lithium and cont with depakote, ativan, risperidone per primary. 4. Dysphagia- cont with thickened liquids. 5. 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  Rec: Advance diet so he can drink(I spoke with SLP) as he is IVF dependent currently. Cont aggressive IVF replacement  Subjective: Interval History: 3300cc UOP  Objective: Vital signs in last 24 hours: Temp:  [98.1 F (36.7 C)-99.8 F (37.7 C)] 98.2 F (36.8 C) (11/30 1007) Pulse Rate:  [65-105] 87 (11/30 1007) Resp:  [17-18] 18 (11/30 1007) BP: (107-124)/(59-77) 122/64 (11/30 1007) SpO2:  [96 %-98 %] 98 % (11/30 1007) Weight:  [74.7 kg (164 lb 9.6 oz)-75.3 kg (166 lb)] 75.3 kg (166 lb) (11/30 3244) Weight change:   Intake/Output from previous day: 11/29 0701 - 11/30 0700 In: 3452.9 [P.O.:1280; I.V.:1972.9; NG/GT:200] Out: 3300 [Urine:3300] Intake/Output this shift: Total I/O In: 120 [P.O.:120] Out: 450 [Urine:450]  Poor oral hygeine, sticky stuff on lips General appearance: alert and cooperative Head: Normocephalic, without obvious abnormality, atraumatic Resp: clear to auscultation bilaterally Chest wall: no tenderness GI: soft, non-tender; bowel sounds normal; no masses,  no organomegaly Extremities: extremities normal, atraumatic, no cyanosis or edema  Lab Results: Recent Labs    04/06/17 0403 04/07/17 0638  WBC 13.2* 16.3*  HGB 11.2* 11.0*  HCT 37.3* 35.9*  PLT 96* 123*   BMET:  Recent Labs    04/06/17 1604 04/07/17 0638  NA 151* 150*  K 3.4* 3.6  CL 118* 116*  CO2 28 29  GLUCOSE 128* 98  BUN 10 10  CREATININE 1.18 1.13  CALCIUM 9.2 9.1   No results for input(s): PTH in the last 72 hours. Iron Studies: No results for input(s): IRON, TIBC, TRANSFERRIN, FERRITIN in the last 72 hours. Studies/Results: No results found.  Scheduled: . aMILoride  10  mg Oral Daily  . amoxicillin-clavulanate  875 mg of amoxicillin Oral Q12H  . benzonatate  100 mg Oral Q8H  . diltiazem  60 mg Oral Q8H  . free water  200 mL Oral Q8H  . heparin  5,000 Units Subcutaneous Q8H  . LORazepam  2 mg Oral BID  . montelukast  10 mg Oral QHS  . predniSONE  40 mg Oral Q breakfast  . risperiDONE microspheres  50 mg Intramuscular Q14 Days  . temazepam  30 mg Oral QHS  . Valproate Sodium  250 mg Oral Q6H      LOS: 11 days   Estanislado Emms 04/07/2017,11:33 AM

## 2017-04-07 NOTE — Progress Notes (Signed)
  Speech Language Pathology  Patient Details Name: Shane Gentry MRN: [id number] DOB: Jan 15, 1976 Today's Date: 04/07/2017 Time:  -      SLP received page from Dr. Florene Glen concerned with pt being on thickened liquids and risk for pt being readmitted with dehydration. His output is high per MD and  per RN pt "cannot get enough to drink". He did not have significant penetration or any aspiration on repeat MBS however was kept on nectar due to waxing/waning episodes of pain/inattention, prior aspiration event after initial MBS and increased risk for aspiration. SLP feels safe upgrading to thin liquids as long as he is able to focus on swallowing. Will continue Dys 2 texture, upgrade to thin, small sips via cup. Will follow.      [name] 04/07/2017, 2:36 PM  [name] [employer] [phone]

## 2017-04-07 NOTE — Progress Notes (Signed)
Physical Therapy Treatment Patient Details Name: Shane Gentry MRN: [id number] DOB: [birth year] Today's Date: 04/07/2017    History of Present Illness This 41 y.o. male admitted from his group home with AMS. Pt found to have sepsis due to UTI.   X-ray showed PNA, likely due to aspiration.  PMH includes:  MR + schizoaffective disorder, h/o SBO, chronic kidney disease    PT Comments    Pt in bed upon arrival. At first didn't want to get up to walk as he was waiting for his lunch. Agreed to walk and come back and sit in his chair so he could eat lunch. Pt needed no physical assistance with bed mobility; supervision for safety. Attempted to use RW to help with previous balance issues while ambulating. Pt unable to keep walker close enough to his body to be safe. Pt impulsive with poor safety awareness. Pt able to ambulate without AD; unsteady on feet but no LOB while ambulating. VCs to lift feet as walking; pt tried and said he couldn't do it. One LOB requiring mod assist +2 to correct when transferring to recliner.  Follow Up Recommendations  Supervision/Assistance - 24 hour;SNF     Equipment Recommendations  Wheelchair (measurements PT)    Recommendations for Other Services       Precautions / Restrictions Precautions Precautions: Fall Precaution Comments: Posterior LOB in standing, no corrective responses noted  Restrictions Weight Bearing Restrictions: No    Mobility  Bed Mobility Overal bed mobility: Needs Assistance Bed Mobility: Supine to Sit     Supine to sit: Supervision;HOB elevated     General bed mobility comments: Supervision for safety due to impulsiveness.  Transfers Overall transfer level: Needs assistance Equipment used: Rolling walker (2 wheeled);1 person hand held assist;None Transfers: Sit to/from Stand Sit to Stand: Min assist;+2 safety/equipment         General transfer comment: assist for balance. Mod assist +2 stand to sit in recliner due to  LOB.   Ambulation/Gait Ambulation/Gait assistance: Min assist;+2 safety/equipment Ambulation Distance (Feet): 380 Feet Assistive device: Rolling walker (2 wheeled);None Gait Pattern/deviations: Step-through pattern;Decreased stride length;Wide base of support;Shuffle;Decreased dorsiflexion - right     General Gait Details: Attempted to use RW to help improve balance. Pt unable to manage RW; difficulty not allowing it to stay too far in front of him. Pt able to ambulate with no hand held assist. Pt drags both feet; cued to lift feet, pt attempted but said he could not do it. Very impulsive and desired to increase gait speed; kept stating he was going too slow. Unsteady on feet but no LOB while ambulating.   Stairs            Wheelchair Mobility    Modified Rankin (Stroke Patients Only)       Balance Overall balance assessment: Needs assistance Sitting-balance support: Feet supported;No upper extremity supported Sitting balance-Leahy Scale: Fair Sitting balance - Comments: static balance okay   Standing balance support: No upper extremity supported Standing balance-Leahy Scale: Poor Standing balance comment: Unsteady on feet while ambulating. LOB while transfering stand to sit.                            Cognition Arousal/Alertness: Awake/alert Behavior During Therapy: WFL for tasks assessed/performed Overall Cognitive Status: No family/caregiver present to determine baseline cognitive functioning  Exercises      General Comments        Pertinent Vitals/Pain Pain Assessment: Faces Faces Pain Scale: Hurts a little bit Pain Location: stomach Pain Intervention(s): Monitored during session;Repositioned    Home Living                      Prior Function            PT Goals (current goals can now be found in the care plan section) Acute Rehab PT Goals Patient Stated Goal: None stated PT  Goal Formulation: Patient unable to participate in goal setting Time For Goal Achievement: 04/13/17 Potential to Achieve Goals: Good    Frequency    Min 3X/week      PT Plan Current plan remains appropriate    Co-evaluation              AM-PAC PT "6 Clicks" Daily Activity  Outcome Measure  Difficulty turning over in bed (including adjusting bedclothes, sheets and blankets)?: A Little Difficulty moving from lying on back to sitting on the side of the bed? : A Little Difficulty sitting down on and standing up from a chair with arms (e.g., wheelchair, bedside commode, etc,.)?: Unable Help needed moving to and from a bed to chair (including a wheelchair)?: A Little Help needed walking in hospital room?: A Little Help needed climbing 3-5 steps with a railing? : A Lot 6 Click Score: 15    End of Session Equipment Utilized During Treatment: Gait belt Activity Tolerance: Patient tolerated treatment well Patient left: in chair;with call bell/phone within reach;with nursing/sitter in room Nurse Communication: Mobility status PT Visit Diagnosis: Other symptoms and signs involving the nervous system (R29.898);Other abnormalities of gait and mobility (R26.89);Unsteadiness on feet (R26.81)     Time: 5170-0174 PT Time Calculation (min) (ACUTE ONLY): 17 min  Charges:  $Gait Training: 8-22 mins                    G Codes:       Janna Arch, SPTA   Janna Arch 04/07/2017, 3:18 PM

## 2017-04-07 NOTE — Clinical Social Work Note (Signed)
30 day note on front of chart for MD to sign. CSW paged MD to notify.  Vianka Ertel, CSW [phone]  

## 2017-04-08 LAB — CBC WITH DIFFERENTIAL/PLATELET
BASOS ABS: 0 10*3/uL (ref 0.0–0.1)
Basophils Relative: 0 %
EOS ABS: 0.2 10*3/uL (ref 0.0–0.7)
Eosinophils Relative: 1 %
HEMATOCRIT: 32.6 % — AB (ref 39.0–52.0)
HEMOGLOBIN: 10 g/dL — AB (ref 13.0–17.0)
LYMPHS ABS: 4 10*3/uL (ref 0.7–4.0)
LYMPHS PCT: 26 %
MCH: 28.9 pg (ref 26.0–34.0)
MCHC: 30.7 g/dL (ref 30.0–36.0)
MCV: 94.2 fL (ref 78.0–100.0)
Monocytes Absolute: 1.5 10*3/uL — ABNORMAL HIGH (ref 0.1–1.0)
Monocytes Relative: 10 %
NEUTROS ABS: 9.6 10*3/uL — AB (ref 1.7–7.7)
Neutrophils Relative %: 63 %
Platelets: 131 10*3/uL — ABNORMAL LOW (ref 150–400)
RBC: 3.46 MIL/uL — AB (ref 4.22–5.81)
RDW: 16.5 % — ABNORMAL HIGH (ref 11.5–15.5)
WBC: 15.3 10*3/uL — AB (ref 4.0–10.5)

## 2017-04-08 LAB — BASIC METABOLIC PANEL
Anion gap: 4 — ABNORMAL LOW (ref 5–15)
BUN: 7 mg/dL (ref 6–20)
CHLORIDE: 110 mmol/L (ref 101–111)
CO2: 30 mmol/L (ref 22–32)
CREATININE: 0.99 mg/dL (ref 0.61–1.24)
Calcium: 8.6 mg/dL — ABNORMAL LOW (ref 8.9–10.3)
GFR calc Af Amer: >60 mL/min (ref >60)
GFR calc non Af Amer: >60 mL/min (ref >60)
GLUCOSE: 94 mg/dL (ref 65–99)
Potassium: 3.4 mmol/L — ABNORMAL LOW (ref 3.5–5.1)
SODIUM: 144 mmol/L (ref 135–145)

## 2017-04-08 LAB — GLUCOSE, CAPILLARY
GLUCOSE-CAPILLARY: 87 mg/dL (ref 65–99)
GLUCOSE-CAPILLARY: 97 mg/dL (ref 65–99)
GLUCOSE-CAPILLARY: 97 mg/dL (ref 65–99)
GLUCOSE-CAPILLARY: 110 mg/dL — AB (ref 65–99)
Glucose-Capillary: 95 mg/dL (ref 65–99)
Glucose-Capillary: 89 mg/dL (ref 65–99)

## 2017-04-08 LAB — PROINSULIN/INSULIN RATIO
Insulin: 8.1 u[IU]/mL
PROINSULIN: 40 pmol/L
Proinsulin/Insulin Ratio: 74 %

## 2017-04-08 NOTE — Progress Notes (Signed)
Pt was stable in AM shift 7am to 7pm, suction set up at bed side and helped pt to do it throughout the time, safety sitter is in bed side, IV fluid running @100c /hr, eating good, tolerating meds, will continue to monitor

## 2017-04-08 NOTE — Progress Notes (Signed)
PROGRESS NOTE  Shane Gentry IDH:[id number] DOB: Oct 02, 1975 DOA: 03/27/2017 PCP: [name], MD  HPI/Recap of past 90 hours: 41 year old long-term resident of group home with hx of prior SBO, HTN, HLD, PAF, failure to thrive, hypothyroidism, schizoaffective disorder + MR, prior dysphagia, severe chronic constipation, presented from group home with altered mental status, cough. On arrival 11/19 to emergency room core temperature of 88 patient was given warm fluids then chest x-ray revealed bilateral infiltrates suggestive of pneumonia, glucose was 60 patient received 2-1/2 L normal saline for presumed aspiration pneumonia. Pt had recurrent aspiration and had to go to the ICU on 11/23, however has improved but currently still on AB.  Today, reported feeling well. Pt denies any chest pain, SOB, abdominal pain, N/V/D/C, fever/chills. Very good appetite   Assessment/Plan: Principal Problem:   Aspiration pneumonitis (HCC) Active Problems:   Schizoaffective disorder, depressive type ([employer])   Failure to thrive in adult   Hyperthyroidism   Sepsis secondary to UTI ([place])   Sepsis ([place])   Aspiration pneumonia ([place])  #Recurrent aspiration pneumonia Improving Afebrile, resolving leukocytosis, not requiring O2 SLP has eva speech therapy evaluated recommend DYS 2 nectar thick Was de-escalated to po levaquin 11/22 D/C vanc/zosyn on 11/23-tapered to Augmentin 11/25 Bc x 2 NGTD, pct lower D/C solu cortef 100 q8-->50 q12, titrated down to Prednisone 13m daily, will wean off  #Nephrogenic diabetes insipidus Hypernatremia, polyuria, lithium use Na 159 on 04/06/17, trending down Nephrology consulted: rec increasing D5W, starting amiloride 119mdaily. 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  Resolved,  baseline creatinine anywhere from 1.0-1.2 ?nephrogenic induced Lithium DI BUN/creatinine better but still hypernatremic Daily BMP  #P.Aflutter Controlled S/P Cardizem gtt [facility] ECHO: LVEF 65-70%, Grade 1DD Cardiology on board: Continue PO diltiazem, [facility] a [facility]#Persistent hypoglycemia Resolved Etiology is unclear Because of his persistent hypoglycemia we will rule out rare causes such as insulinoma nursing order has been placed for insulin level, C-peptide, beta-hydroxybutyrate and proinsulin levels Follow up as an outpt  #Acute hypoxic resp failure Resolved Probably 2/2 pna CXR 11/25 shows fluid infection Repeat chest x-ray 1 view 11/26 with unchanged infiltrates suggestive of PNA  PRN O2  #Met encephalopathy Resolving 2/2 to sepsis vs hypoglycemia Fluid now just d5W  #Hypothyroidism TSH slightly elevated at 7.7, likely due to acute illness Outpt follow up required  #Affective disorder, MR reported Stable Resuming home meds 11/24 Risperdal Consta 50 Q 14--ordered for 11/29 temazepam 30 at bedtime, Depakote 1000 at bedtime  lorazepam 2 mg twice daily Hold lithium 1 capsule at bedtime due to diabetes insipidus  #HTN holding metoprolol XL 25 due to soft BP  #Hyperlipidemia  Continue atorvastatin 20  #Chronic severe constipation  Continue MiraLAX 17 g daily ensure proper bowel regimen  #Thrombocytopenia, unclear cause [facility] but no information currently available at this time DIC panel is negative think this is probably just medication related    Code Status: Full  Family Communication: None at bedside  Disposition Plan: Possibly back to NH by 04/09/17   Consultants:  Nephrology  Procedures:  None   Antimicrobials:  PO Augmentin  DVT prophylaxis:  Heparin [place]   Objective: Vitals:   04/07/17 1921 04/08/17 0420 04/08/17 0543 04/08/17 1200  BP: (!Marland Kitchen  115/45  (!) 99/51  (!) 133/54  Pulse: 93 83  96  Resp: 18 18  20   Temp: 97.9 F (36.6 C) 98.4 F (36.9 C)  98.8 F (37.1 C)  TempSrc: Axillary Oral  Oral  SpO2: 97% 93%  96%  Weight:   76.8 kg (169 lb 5 oz)   Height:        Intake/Output Summary (Last 24 hours) at 04/08/2017 1253 Last data filed at 04/08/2017 1248 Gross per 24 hour  Intake 4610 ml  Output 8450 ml  Net -3840 ml   Filed Weights   04/06/17 1840 04/07/17 0619 04/08/17 0543  Weight: 74.7 kg (164 lb 9.6 oz) 75.3 kg (166 lb) 76.8 kg (169 lb 5 oz)    Exam:   General: alert, awake, oriented to self  Cardiovascular: S1-S2 present, no added hrt sound  Respiratory: Chest clear bilaterally  Abdomen: Soft, non tender, non-distended, BS present  Musculoskeletal: No bilateral pedal edema  Skin: Normal  Psychiatry: Normal mood   Data Reviewed: CBC: Recent Labs  Lab 04/04/17 0225 04/05/17 0243 04/06/17 0403 04/07/17 0638 04/08/17 0409  WBC 17.7* 13.5* 13.2* 16.3* 15.3*  NEUTROABS 14.6* 9.6* 9.6* 11.5* 9.6*  HGB 11.5* 11.4* 11.2* 11.0* 10.0*  HCT 37.7* 37.1* 37.3* 35.9* 32.6*  MCV 94.5 95.4 95.6 95.5 94.2  PLT 52* 62* 96* 123* 791*   Basic Metabolic Panel: Recent Labs  Lab 04/02/17 0240 04/02/17 1232 04/03/17 0816  04/04/17 0225 04/05/17 0243 04/06/17 0403 04/06/17 1604 04/07/17 0638 04/08/17 0409  NA 148*  --  155*   < > 153* 153* 159* 151* 150* 144  K 4.0  --  3.6   < > 3.4* 3.4* 3.7 3.4* 3.6 3.4*  CL 119*  --  119*   < > 120* 118* 122* 118* 116* 110  CO2 24  --  31   < > 30 31 30 28 29 30   GLUCOSE 100*  --  78   < > 104* 103* 92 128* 98 94  BUN 13  --  10   < > 7 6 11 10 10 7   CREATININE 1.28*  --  1.18   < > 1.09 1.11 1.22 1.18 1.13 0.99  CALCIUM 9.1  --  9.8   < > 9.4 9.3 9.4 9.2 9.1 8.6*  MG  --  2.3 2.1  --   --   --   --   --   --   --   PHOS 3.2  --   --   --  3.6 2.9  --  3.2  --   --    < > = values in this interval not displayed.   GFR: Estimated Creatinine Clearance: 96.2 mL/min (by  C-G formula based on SCr of 0.99 mg/dL). Liver Function Tests: Recent Labs  Lab 04/02/17 0240 04/03/17 0816 04/04/17 0225 04/05/17 0243 04/06/17 1604  AST  --  16  --   --   --   ALT  --  20  --   --   --   ALKPHOS  --  28*  --   --   --   BILITOT  --  0.4  --   --   --   PROT  --  5.7*  --   --   --   ALBUMIN 2.2* 2.6* 2.2* 2.3* 2.6*   No results for input(s): LIPASE, AMYLASE in the last 168 hours. No results for input(s): AMMONIA in the last 168 hours.  Coagulation Profile: No results for input(s): INR, PROTIME in the last 168 hours. Cardiac Enzymes: No results for input(s): CKTOTAL, CKMB, CKMBINDEX, TROPONINI in the last 168 hours. BNP (last 3 results) No results for input(s): PROBNP in the last 8760 hours. HbA1C: No results for input(s): HGBA1C in the last 72 hours. CBG: Recent Labs  Lab 04/07/17 1854 04/07/17 2342 04/08/17 0542 04/08/17 0819 04/08/17 1110  GLUCAP 75 88 95 87 97   Lipid Profile: No results for input(s): CHOL, HDL, LDLCALC, TRIG, CHOLHDL, LDLDIRECT in the last 72 hours. Thyroid Function Tests: No results for input(s): TSH, T4TOTAL, FREET4, T3FREE, THYROIDAB in the last 72 hours. Anemia Panel: No results for input(s): VITAMINB12, FOLATE, FERRITIN, TIBC, IRON, RETICCTPCT in the last 72 hours. Urine analysis:    Component Value Date/Time   COLORURINE YELLOW 03/27/2017 2226   APPEARANCEUR CLEAR 03/27/2017 2226   APPEARANCEUR Clear 02/04/2015 0940   LABSPEC 1.011 03/27/2017 2226   PHURINE 5.0 03/27/2017 2226   GLUCOSEU NEGATIVE 03/27/2017 2226   HGBUR NEGATIVE 03/27/2017 2226   BILIRUBINUR NEGATIVE 03/27/2017 2226   BILIRUBINUR Negative 02/04/2015 0940   KETONESUR NEGATIVE 03/27/2017 2226   PROTEINUR NEGATIVE 03/27/2017 2226   UROBILINOGEN 1.0 02/04/2015 1631   NITRITE NEGATIVE 03/27/2017 2226   LEUKOCYTESUR NEGATIVE 03/27/2017 2226   LEUKOCYTESUR Negative 02/04/2015 0940   Sepsis Labs: @LABRCNTIP (procalcitonin:4,lacticidven:4)  ) Recent  Results (from the past 240 hour(s))  Culture, blood (routine x 2)     Status: None   Collection Time: 03/31/17  8:58 AM  Result Value Ref Range Status   Specimen Description BLOOD LEFT ANTECUBITAL  Final   Special Requests   Final    BOTTLES DRAWN AEROBIC ONLY Blood Culture adequate volume   Culture NO GROWTH 5 DAYS  Final   Report Status 04/05/2017 FINAL  Final  Culture, blood (routine x 2)     Status: None   Collection Time: 03/31/17  9:05 AM  Result Value Ref Range Status   Specimen Description BLOOD LEFT FOREARM  Final   Special Requests   Final    BOTTLES DRAWN AEROBIC ONLY Blood Culture adequate volume   Culture NO GROWTH 5 DAYS  Final   Report Status 04/05/2017 FINAL  Final      Studies: No results found.  Scheduled Meds: . aMILoride  10 mg Oral Daily  . amoxicillin-clavulanate  875 mg of amoxicillin Oral Q12H  . benzonatate  100 mg Oral Q8H  . diltiazem  60 mg Oral Q8H  . free water  200 mL Oral Q8H  . heparin  5,000 Units Subcutaneous Q8H  . LORazepam  2 mg Oral BID  . montelukast  10 mg Oral QHS  . predniSONE  20 mg Oral Q breakfast  . risperiDONE microspheres  50 mg Intramuscular Q14 Days  . temazepam  30 mg Oral QHS  . Valproate Sodium  250 mg Oral Q6H    Continuous Infusions: . dextrose 250 mL/hr at 04/08/17 1006     LOS: 12 days     Alma Friendly, MD [facility]  If 7PM-7AM, please contact night-coverage [email or web address] Password [facility] 04/08/2017, 12:53 PM

## 2017-04-08 NOTE — Progress Notes (Signed)
Assessment/Plan: 1. Hypernatremia- due to NDI, improved.  Will wean IVFs 2. Aspiration pneumonia- on augmentin per primary 3. Schizoaffective disorder- agree with holding lithium and cont with depakote, ativan, risperidone per primary. 4. Dysphagia- cont with thickened liquids. 5. 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  Rec: Allow him ad libitum fluids PO and wean off IVFs  Subjective: Interval History: Drinking ok  Objective: Vital signs in last 24 hours: Temp:  [97.9 F (36.6 C)-99.7 F (37.6 C)] 99.7 F (37.6 C) (12/01 1422) Pulse Rate:  [83-96] 95 (12/01 1422) Resp:  [18-20] 18 (12/01 1422) BP: (99-133)/(45-56) 110/56 (12/01 1422) SpO2:  [93 %-97 %] 96 % (12/01 1422) Weight:  [76.8 kg (169 lb 5 oz)] 76.8 kg (169 lb 5 oz) (12/01 0543) Weight change: 2.138 kg (4 lb 11.4 oz)  Intake/Output from previous day: 11/30 0701 - 12/01 0700 In: 4610 [P.O.:2360; I.V.:2250] Out: 8450 [Urine:8450] Intake/Output this shift: Total I/O In: 360 [P.O.:360] Out: 3750 [Urine:3750]  General appearance: alert and cooperative Resp: rhonchi LLL Extremities: extremities normal, atraumatic, no cyanosis or edema  Lab Results: Recent Labs    04/07/17 0638 04/08/17 0409  WBC 16.3* 15.3*  HGB 11.0* 10.0*  HCT 35.9* 32.6*  PLT 123* 131*   BMET:  Recent Labs    04/07/17 0638 04/08/17 0409  NA 150* 144  K 3.6 3.4*  CL 116* 110  CO2 29 30  GLUCOSE 98 94  BUN 10 7  CREATININE 1.13 0.99  CALCIUM 9.1 8.6*   No results for input(s): PTH in the last 72 hours. Iron Studies: No results for input(s): IRON, TIBC, TRANSFERRIN, FERRITIN in the last 72 hours. Studies/Results: No results found.  Scheduled: . aMILoride  10 mg Oral Daily  . amoxicillin-clavulanate  875 mg of amoxicillin Oral Q12H  . benzonatate  100 mg Oral Q8H  . diltiazem  60 mg Oral Q8H  . free water  200 mL Oral Q8H  . heparin  5,000 Units Subcutaneous Q8H  . LORazepam  2 mg Oral BID  . montelukast  10 mg  Oral QHS  . predniSONE  20 mg Oral Q breakfast  . risperiDONE microspheres  50 mg Intramuscular Q14 Days  . temazepam  30 mg Oral QHS  . Valproate Sodium  250 mg Oral Q6H    LOS: 12 days   Estanislado Emms 04/08/2017,4:15 PM

## 2017-04-09 LAB — BASIC METABOLIC PANEL
ANION GAP: 8 (ref 5–15)
BUN: 12 mg/dL (ref 6–20)
CALCIUM: 9.2 mg/dL (ref 8.9–10.3)
CHLORIDE: 114 mmol/L — AB (ref 101–111)
CO2: 26 mmol/L (ref 22–32)
Creatinine, Ser: 1.15 mg/dL (ref 0.61–1.24)
GFR calc non Af Amer: >60 mL/min (ref >60)
Glucose, Bld: 83 mg/dL (ref 65–99)
POTASSIUM: 4.3 mmol/L (ref 3.5–5.1)
Sodium: 148 mmol/L — ABNORMAL HIGH (ref 135–145)

## 2017-04-09 LAB — CBC WITH DIFFERENTIAL/PLATELET
BASOS ABS: 0 10*3/uL (ref 0.0–0.1)
Basophils Relative: 0 %
EOS ABS: 0.1 10*3/uL (ref 0.0–0.7)
Eosinophils Relative: 1 %
HEMATOCRIT: 37.8 % — AB (ref 39.0–52.0)
HEMOGLOBIN: 11.5 g/dL — AB (ref 13.0–17.0)
LYMPHS PCT: 21 %
Lymphs Abs: 2.9 10*3/uL (ref 0.7–4.0)
MCH: 28.8 pg (ref 26.0–34.0)
MCHC: 30.4 g/dL (ref 30.0–36.0)
MCV: 94.5 fL (ref 78.0–100.0)
Monocytes Absolute: 1.9 10*3/uL — ABNORMAL HIGH (ref 0.1–1.0)
Monocytes Relative: 14 %
NEUTROS PCT: 64 %
Neutro Abs: 9 10*3/uL — ABNORMAL HIGH (ref 1.7–7.7)
Platelets: 187 10*3/uL (ref 150–400)
RBC: 4 MIL/uL — AB (ref 4.22–5.81)
RDW: 16.8 % — ABNORMAL HIGH (ref 11.5–15.5)
WBC: 13.9 10*3/uL — ABNORMAL HIGH (ref 4.0–10.5)

## 2017-04-09 LAB — GLUCOSE, CAPILLARY
GLUCOSE-CAPILLARY: 84 mg/dL (ref 65–99)
Glucose-Capillary: 146 mg/dL — ABNORMAL HIGH (ref 65–99)
Glucose-Capillary: 82 mg/dL (ref 65–99)
Glucose-Capillary: 91 mg/dL (ref 65–99)

## 2017-04-09 NOTE — Progress Notes (Signed)
RN is encouraging patient to drink a lot of fluids, since morning patient drink almost 2000 cc, still there are 5 water filled cups in front of patients, made it nectar think to prevent aspiration and observed drinking, besides patient is drinking juices and other beverages

## 2017-04-09 NOTE — Plan of Care (Signed)
  Progressing Coping: Level of anxiety will decrease 04/09/2017 0017 - Progressing by [place] Pugh, RN Elimination: Will not experience complications related to urinary retention 04/09/2017 0017 - Progressing by [place] Pugh, RN Safety: Ability to remain free from injury will improve 04/09/2017 0017 - Progressing by [place] Pugh, RN Respiratory: Ability to maintain a clear airway will improve 04/09/2017 0017 - Progressing by [place] Pugh, RN

## 2017-04-09 NOTE — Progress Notes (Signed)
Pt is sitting in a recliner pretty much of the day, IV fluid Dextrose running @ 100cc/hr, taking lots of fluids orally too, no any specific complain of chest pain/SOB and distress, will continue to monitor the paitent

## 2017-04-09 NOTE — Progress Notes (Signed)
Assessment/Plan: 1. Hypernatremia-due to NDI, worse on less IVFs 2. Aspiration pneumonia- on augmentin per primary 3. Schizoaffective disorder- agree with holding lithium and cont with depakote, ativan, risperidone per primary. 4. Dysphagia- cont with thickened liquids. 5. 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  Rec: Allow him ad libitum fluids PO and wean off IVFs.  I d/w RN.  If he is not able to drink enough he will not be able to get of IVFs          He should never be restarted on Lithium   Subjective: Interval History: OK  Objective: Vital signs in last 24 hours: Temp:  [97.4 F (36.3 C)-98.5 F (36.9 C)] 97.4 F (36.3 C) (12/02 1200) Pulse Rate:  [86-90] 86 (12/02 1200) Resp:  [18] 18 (12/02 1200) BP: (97-117)/(61) 97/61 (12/02 1200) SpO2:  [96 %-99 %] 99 % (12/02 1200) Weight change:   Intake/Output from previous day: 12/01 0701 - 12/02 0700 In: 4108.3 [P.O.:920; I.V.:2988.3; NG/GT:200] Out: 10700 [Urine:10700] Intake/Output this shift: Total I/O In: 720 [P.O.:720] Out: 1500 [Urine:1500]  General appearance: alert and cooperative  Ext with some wrinkles no edema   Lab Results: Recent Labs    04/08/17 0409 04/09/17 0352  WBC 15.3* 13.9*  HGB 10.0* 11.5*  HCT 32.6* 37.8*  PLT 131* 187   BMET:  Recent Labs    04/08/17 0409 04/09/17 0352  NA 144 148*  K 3.4* 4.3  CL 110 114*  CO2 30 26  GLUCOSE 94 83  BUN 7 12  CREATININE 0.99 1.15  CALCIUM 8.6* 9.2   No results for input(s): PTH in the last 72 hours. Iron Studies: No results for input(s): IRON, TIBC, TRANSFERRIN, FERRITIN in the last 72 hours. Studies/Results: No results found.  Scheduled: . aMILoride  10 mg Oral Daily  . amoxicillin-clavulanate  875 mg of amoxicillin Oral Q12H  . benzonatate  100 mg Oral Q8H  . diltiazem  60 mg Oral Q8H  . free water  200 mL Oral Q8H  . heparin  5,000 Units Subcutaneous Q8H  . LORazepam  2 mg Oral BID  . montelukast  10 mg Oral QHS  .  predniSONE  20 mg Oral Q breakfast  . risperiDONE microspheres  50 mg Intramuscular Q14 Days  . temazepam  30 mg Oral QHS  . Valproate Sodium  250 mg Oral Q6H   Continuous: . dextrose 100 mL/hr at 04/09/17 0948      LOS: 13 days   Estanislado Emms 04/09/2017,2:36 PM

## 2017-04-09 NOTE — Plan of Care (Signed)
Pt is stable and appropriate with improved NA level plan to not resume litium and continue IV fluid and encourage PO fluids.  Strict intake and output.

## 2017-04-09 NOTE — Progress Notes (Signed)
PROGRESS NOTE  Shane Gentry DZH:[id number] DOB: July 04, 1975 DOA: 03/27/2017 PCP: [name], MD  HPI/Recap of past 23 hours: 41 year old long-term resident of group home with hx of prior SBO, HTN, HLD, PAF, failure to thrive, hypothyroidism, schizoaffective disorder + MR, prior dysphagia, severe chronic constipation, presented from group home with altered mental status, cough. On arrival 11/19 to emergency room core temperature of 88 patient was given warm fluids then chest x-ray revealed bilateral infiltrates suggestive of pneumonia, glucose was 60 patient received 2-1/2 L normal saline for presumed aspiration pneumonia. Pt had recurrent aspiration and had to go to the ICU on 11/23, however has improved but currently still on AB.  Today, reported feeling well. Pt denies any chest pain, SOB, abdominal pain, N/V/D/C, fever/chills. Very good appetite. Discussed with RN to encourage liberal oral fluid intake   Assessment/Plan: Principal Problem:   Aspiration pneumonitis (HCC) Active Problems:   Schizoaffective disorder, depressive type ([place])   Failure to thrive in adult   Hyperthyroidism   Sepsis secondary to UTI ([place])   Sepsis ([name])   Aspiration pneumonia ([name])  #Recurrent aspiration pneumonia Improved Afebrile, resolving leukocytosis, not requiring O2 SLP has eva speech therapy evaluated recommend DYS 2 nectar thick Was de-escalated to po levaquin 11/22 D/C vanc/zosyn on 11/23-tapered to Augmentin 11/25 Bc x 2 NGTD, pct lower D/C solu cortef 100 q8-->50 q12, titrated down to Prednisone 33m daily, will wean off  #Nephrogenic diabetes insipidus Hypernatremia, polyuria, lithium use Na 159 on 04/06/17, trending down Nephrology consulted: rec increasing D5W, starting amiloride 122mdaily. If no response, can consider HCTZ/indomethacin Continue IV D5W 100cc/hr, wean off Encouraging oral fluids as well 200 q8H. 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  Resolved, baseline creatinine anywhere from 1.0-1.2 ?nephrogenic induced Lithium DI BUN/creatinine better but still hypernatremic Daily BMP  #P.Aflutter Controlled S/P Cardizem gtt [facility] E[facility]: LVEF 65-70%, Grade 1DD Cardiology on board: Continue PO [facility]iltiazem, [facility]#Persistent hypoglycemia Resolved Etiology is unclear Because of his persistent hypoglycemia we will rule out rare causes such as insulinoma nursing order has been placed for insulin level, C-peptide, beta-hydroxybutyrate and proinsulin levels Follow up as an outpt  #Acute hypoxic resp failure Resolved Probably 2/2 pna CXR 11/25 shows fluid infection Repeat chest x-ray 1 view 11/26 with unchanged infiltrates suggestive of PNA  PRN O2  #Met encephalopathy Resolved 2/2 to sepsis vs hypoglycemia Fluid now just d5W  #Hypothyroidism TSH slightly elevated at 7.7, likely due to acute illness Outpt follow up required  #Affective disorder, MR reported Stable Resuming home meds 11/24 Risperdal Consta 50 Q 14--ordered for 11/29 temazepam 30 at bedtime, Depakote 1000 at bedtime  lorazepam 2 mg twice daily Hold lithium 1 capsule at bedtime due to diabetes insipidus  #HTN holding metoprolol XL 25 due to soft BP  #Hyperlipidemia  Continue atorvastatin 20  #Chronic severe constipation  Continue MiraLAX 17 g daily ensure proper bowel regimen  #Thrombocytopenia, unclear cause [facility] but no information currently available at this time DIC panel is negative think this is probably just medication related    Code Status: Full  Family Communication: None at bedside  Disposition Plan: Possibly back to NH by 04/11/17   Consultants:  Nephrology  Procedures:  None   Antimicrobials:  PO Augmentin  DVT prophylaxis:  Heparin [place]   Objective: Vitals:  04/08/17 1200 04/08/17 1422 04/08/17 2100 04/09/17 1200  BP: (!) 133/54 (!) 110/56 117/61 97/61  Pulse: 96 95 90 86  Resp: 20 18 18 18   Temp: 98.8 F (37.1 C) 99.7 F (37.6 C) 98.5 F (36.9 C) (!) 97.4 F (36.3 C)  TempSrc: Oral Oral Oral Oral  SpO2: 96% 96% 96% 99%  Weight:      Height:        Intake/Output Summary (Last 24 hours) at 04/09/2017 1443 Last data filed at 04/09/2017 1338 Gross per 24 hour  Intake 4468.33 ml  Output 9450 ml  Net -4981.67 ml   Filed Weights   04/06/17 1840 04/07/17 0619 04/08/17 0543  Weight: 74.7 kg (164 lb 9.6 oz) 75.3 kg (166 lb) 76.8 kg (169 lb 5 oz)    Exam:   General: alert, awake, oriented to self  Cardiovascular: S1-S2 present, no added hrt sound  Respiratory: Chest clear bilaterally  Abdomen: Soft, non tender, non-distended, BS present  Musculoskeletal: No bilateral pedal edema  Skin: Normal  Psychiatry: Normal mood   Data Reviewed: CBC: Recent Labs  Lab 04/05/17 0243 04/06/17 0403 04/07/17 0638 04/08/17 0409 04/09/17 0352  WBC 13.5* 13.2* 16.3* 15.3* 13.9*  NEUTROABS 9.6* 9.6* 11.5* 9.6* 9.0*  HGB 11.4* 11.2* 11.0* 10.0* 11.5*  HCT 37.1* 37.3* 35.9* 32.6* 37.8*  MCV 95.4 95.6 95.5 94.2 94.5  PLT 62* 96* 123* 131* 962   Basic Metabolic Panel: Recent Labs  Lab 04/03/17 0816  04/04/17 0225 04/05/17 0243 04/06/17 0403 04/06/17 1604 04/07/17 0638 04/08/17 0409 04/09/17 0352  NA 155*   < > 153* 153* 159* 151* 150* 144 148*  K 3.6   < > 3.4* 3.4* 3.7 3.4* 3.6 3.4* 4.3  CL 119*   < > 120* 118* 122* 118* 116* 110 114*  CO2 31   < > 30 31 30 28 29 30 26   GLUCOSE 78   < > 104* 103* 92 128* 98 94 83  BUN 10   < > 7 6 11 10 10 7 12   CREATININE 1.18   < > 1.09 1.11 1.22 1.18 1.13 0.99 1.15  CALCIUM 9.8   < > 9.4 9.3 9.4 9.2 9.1 8.6* 9.2  MG 2.1  --   --   --   --   --   --   --   --   PHOS  --   --  3.6 2.9  --  3.2  --   --   --    < > = values in this interval not displayed.   GFR: Estimated Creatinine  Clearance: 82.8 mL/min (by C-G formula based on SCr of 1.15 mg/dL). Liver Function Tests: Recent Labs  Lab 04/03/17 0816 04/04/17 0225 04/05/17 0243 04/06/17 1604  AST 16  --   --   --   ALT 20  --   --   --   ALKPHOS 28*  --   --   --   BILITOT 0.4  --   --   --   PROT 5.7*  --   --   --   ALBUMIN 2.6* 2.2* 2.3* 2.6*   No results for input(s): LIPASE, AMYLASE in the last 168 hours. No results for input(s): AMMONIA in the last 168 hours. Coagulation Profile: No results for input(s): INR, PROTIME in the last 168 hours. Cardiac Enzymes: No results for input(s): CKTOTAL, CKMB, CKMBINDEX, TROPONINI in the last 168 hours. BNP (last 3 results) No results for input(s): PROBNP in  the last 8760 hours. HbA1C: No results for input(s): HGBA1C in the last 72 hours. CBG: Recent Labs  Lab 04/08/17 1656 04/08/17 2133 04/08/17 2255 04/09/17 0721 04/09/17 1121  GLUCAP 97 110* 89 146* 82   Lipid Profile: No results for input(s): CHOL, HDL, LDLCALC, TRIG, CHOLHDL, LDLDIRECT in the last 72 hours. Thyroid Function Tests: No results for input(s): TSH, T4TOTAL, FREET4, T3FREE, THYROIDAB in the last 72 hours. Anemia Panel: No results for input(s): VITAMINB12, FOLATE, FERRITIN, TIBC, IRON, RETICCTPCT in the last 72 hours. Urine analysis:    Component Value Date/Time   COLORURINE YELLOW 03/27/2017 2226   APPEARANCEUR CLEAR 03/27/2017 2226   APPEARANCEUR Clear 02/04/2015 0940   LABSPEC 1.011 03/27/2017 2226   PHURINE 5.0 03/27/2017 2226   GLUCOSEU NEGATIVE 03/27/2017 2226   HGBUR NEGATIVE 03/27/2017 2226   BILIRUBINUR NEGATIVE 03/27/2017 2226   BILIRUBINUR Negative 02/04/2015 0940   KETONESUR NEGATIVE 03/27/2017 2226   PROTEINUR NEGATIVE 03/27/2017 2226   UROBILINOGEN 1.0 02/04/2015 1631   NITRITE NEGATIVE 03/27/2017 2226   LEUKOCYTESUR NEGATIVE 03/27/2017 2226   LEUKOCYTESUR Negative 02/04/2015 0940   Sepsis Labs: @LABRCNTIP (procalcitonin:4,lacticidven:4)  ) Recent Results (from  the past 240 hour(s))  Culture, blood (routine x 2)     Status: None   Collection Time: 03/31/17  8:58 AM  Result Value Ref Range Status   Specimen Description BLOOD LEFT ANTECUBITAL  Final   Special Requests   Final    BOTTLES DRAWN AEROBIC ONLY Blood Culture adequate volume   Culture NO GROWTH 5 DAYS  Final   Report Status 04/05/2017 FINAL  Final  Culture, blood (routine x 2)     Status: None   Collection Time: 03/31/17  9:05 AM  Result Value Ref Range Status   Specimen Description BLOOD LEFT FOREARM  Final   Special Requests   Final    BOTTLES DRAWN AEROBIC ONLY Blood Culture adequate volume   Culture NO GROWTH 5 DAYS  Final   Report Status 04/05/2017 FINAL  Final      Studies: No results found.  Scheduled Meds: . aMILoride  10 mg Oral Daily  . amoxicillin-clavulanate  875 mg of amoxicillin Oral Q12H  . benzonatate  100 mg Oral Q8H  . diltiazem  60 mg Oral Q8H  . free water  200 mL Oral Q8H  . heparin  5,000 Units Subcutaneous Q8H  . LORazepam  2 mg Oral BID  . montelukast  10 mg Oral QHS  . predniSONE  20 mg Oral Q breakfast  . risperiDONE microspheres  50 mg Intramuscular Q14 Days  . temazepam  30 mg Oral QHS  . Valproate Sodium  250 mg Oral Q6H    Continuous Infusions: . dextrose 100 mL/hr at 04/09/17 0948     LOS: 13 days     Alma Friendly, MD [facility]  If 7PM-7AM, please contact night-coverage [email or web address] Password [facility] 04/09/2017, 2:43 PM

## 2017-04-09 NOTE — Clinical Social Work Note (Signed)
CSW attempted to check [facility] number for SNF placement, [facility] website is still down, unit social worker will continue to follow patient's progress throughout discharge planning.  Jones Broom. Heer Justiss, MSW, [place]  04/09/2017 10:18 AM

## 2017-04-10 LAB — RENAL FUNCTION PANEL
Albumin: 2.8 g/dL — ABNORMAL LOW (ref 3.5–5.0)
Anion gap: 8 (ref 5–15)
BUN: 13 mg/dL (ref 6–20)
CALCIUM: 9.6 mg/dL (ref 8.9–10.3)
CHLORIDE: 98 mmol/L — AB (ref 101–111)
CO2: 26 mmol/L (ref 22–32)
CREATININE: 1.21 mg/dL (ref 0.61–1.24)
GFR calc Af Amer: >60 mL/min (ref >60)
Glucose, Bld: 92 mg/dL (ref 65–99)
PHOSPHORUS: 4.2 mg/dL (ref 2.5–4.6)
POTASSIUM: 4.9 mmol/L (ref 3.5–5.1)
Sodium: 132 mmol/L — ABNORMAL LOW (ref 135–145)

## 2017-04-10 LAB — GLUCOSE, CAPILLARY
GLUCOSE-CAPILLARY: 95 mg/dL (ref 65–99)
GLUCOSE-CAPILLARY: 106 mg/dL — AB (ref 65–99)
Glucose-Capillary: 99 mg/dL (ref 65–99)
Glucose-Capillary: 93 mg/dL (ref 65–99)

## 2017-04-10 LAB — BASIC METABOLIC PANEL
Anion gap: 7 (ref 5–15)
BUN: 13 mg/dL (ref 6–20)
CALCIUM: 9.2 mg/dL (ref 8.9–10.3)
CO2: 26 mmol/L (ref 22–32)
CREATININE: 1.04 mg/dL (ref 0.61–1.24)
Chloride: 101 mmol/L (ref 101–111)
GFR calc Af Amer: >60 mL/min (ref >60)
Glucose, Bld: 88 mg/dL (ref 65–99)
Potassium: 4.5 mmol/L (ref 3.5–5.1)
SODIUM: 134 mmol/L — AB (ref 135–145)

## 2017-04-10 LAB — CBC WITH DIFFERENTIAL/PLATELET
BASOS ABS: 0 10*3/uL (ref 0.0–0.1)
BASOS PCT: 0 %
Eosinophils Absolute: 0.2 10*3/uL (ref 0.0–0.7)
Eosinophils Relative: 1 %
HEMATOCRIT: 38.2 % — AB (ref 39.0–52.0)
Hemoglobin: 12.2 g/dL — ABNORMAL LOW (ref 13.0–17.0)
Lymphocytes Relative: 23 %
Lymphs Abs: 3.5 10*3/uL (ref 0.7–4.0)
MCH: 29.5 pg (ref 26.0–34.0)
MCHC: 31.9 g/dL (ref 30.0–36.0)
MCV: 92.3 fL (ref 78.0–100.0)
MONO ABS: 2.1 10*3/uL — AB (ref 0.1–1.0)
Monocytes Relative: 14 %
NEUTROS ABS: 9.2 10*3/uL — AB (ref 1.7–7.7)
NEUTROS PCT: 62 %
PLATELETS: 218 10*3/uL (ref 150–400)
RBC: 4.14 MIL/uL — ABNORMAL LOW (ref 4.22–5.81)
RDW: 16.1 % — AB (ref 11.5–15.5)
WBC: 15 10*3/uL — ABNORMAL HIGH (ref 4.0–10.5)

## 2017-04-10 NOTE — Progress Notes (Signed)
PROGRESS NOTE  Shane Gentry VWU:[id number] DOB: [birth year] DOA: 03/27/2017 PCP: [name], MD  HPI/Recap of past 61 hours: 41 year old long-term resident of group home with hx of prior SBO, HTN, HLD, PAF, failure to thrive, hypothyroidism, schizoaffective disorder + MR, prior dysphagia, severe chronic constipation, presented from group home with altered mental status, cough. On arrival 11/19 to emergency room core temperature of 88 patient was given warm fluids then chest x-ray revealed bilateral infiltrates suggestive of pneumonia, glucose was 60 patient received 2-1/2 L normal saline for presumed aspiration pneumonia. Pt had recurrent aspiration and had to go to the ICU on 11/23, however has improved but currently still on AB.  Today, pt noted to be more sleepy than usual, ??aspiration. Was arousable, denied any chest pain, SOB, abdominal pain, N/V/D/C, fever/chills. Discussed with RN to encourage liberal oral fluid intake, but should be monitored closely   Assessment/Plan: Principal Problem:   Aspiration pneumonitis (HCC) Active Problems:   Schizoaffective disorder, depressive type ([place])   Failure to thrive in adult   Hyperthyroidism   Sepsis secondary to UTI ([place])   Sepsis ([place])   Aspiration pneumonia ([place])  #Recurrent aspiration pneumonia Afebrile, resolving leukocytosis, not requiring O2 SLP has eva speech therapy evaluated recommend DYS 2 nectar thick Was de-escalated to po levaquin 11/22 D/C vanc/zosyn on 11/23-tapered to Augmentin 11/25 Bc x 2 NGTD, pct lower D/C solu cortef 100 q8-->50 q12, titrated down and discontinued  #Nephrogenic diabetes insipidus Hypernatremia, polyuria, lithium use Na 159 on 04/06/17, currently resolving Nephrology consulted: rec increasing D5W, starting amiloride 26m daily. If no response, can consider HCTZ/indomethacin Continue IV D5W 50cc/hr, plan to wean off, in the hopes pt is able to drink adequately orally Encouraging oral  fluids as well 200 q8H. 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  Resolved, baseline creatinine anywhere from 1.0-1.2 ?nephrogenic induced Lithium DI BUN/creatinine better but still hypernatremic Daily BMP  #P.Aflutter Controlled S/P Cardizem gtt [facility]: LVEF 65-70%, Grade 1DD Cardiology on board: Continue PO diltiazem, [facility] #Persistent hypoglycemia Resolved Etiology is unclear Because of his persistent hypoglycemia we will rule out rare causes such as insulinoma nursing order has been placed for insulin level, C-peptide, beta-hydroxybutyrate and proinsulin levels Follow up as an outpt  #Acute hypoxic resp failure Resolved Probably 2/2 pna CXR 11/25 shows fluid infection Repeat chest x-ray 1 view 11/26 with unchanged infiltrates suggestive of PNA  PRN O2  #Met encephalopathy Resolved 2/2 to sepsis vs hypoglycemia  #Hypothyroidism TSH slightly elevated at 7.7, likely due to acute illness Outpt follow up required  #Affective disorder, MR reported Stable Resuming home meds 11/24 Risperdal Consta 50 Q 14--ordered for 11/29 temazepam 30 at bedtime, Depakote 1000 at bedtime  lorazepam 2 mg twice daily Discontinue lithium at bedtime due to diabetes insipidus  #HTN holding metoprolol XL 25 due to soft BP  #Hyperlipidemia  Continue atorvastatin 20  #Chronic severe constipation  Continue MiraLAX 17 g daily ensure proper bowel regimen  #Thrombocytopenia, unclear cause [facility] but no information currently available at this time DIC panel is negative think this is probably just medication related    Code Status: Full  Family Communication: None at bedside  Disposition Plan: Possibly back to NH by  04/12/17   Consultants:  Nephrology  Procedures:  None   Antimicrobials:  PO Augmentin  DVT prophylaxis:  Heparin [place]   Objective: Vitals:   04/09/17 1200 04/09/17 2059 04/10/17 1337 04/10/17 1405  BP: 97/61 (!) 102/54 102/74 101/63  Pulse: 86 79 91 91  Resp: 18   18  Temp: (!) 97.4 F (36.3 C) 98.2 F (36.8 C)  98.2 F (36.8 C)  TempSrc: Oral Tympanic  Oral  SpO2: 99% 97% 97% 93%  Weight:      Height:        Intake/Output Summary (Last 24 hours) at 04/10/2017 1629 Last data filed at 04/10/2017 1539 Gross per 24 hour  Intake 4283.33 ml  Output 4750 ml  Net -466.67 ml   Filed Weights   04/06/17 1840 04/07/17 0619 04/08/17 0543  Weight: 74.7 kg (164 lb 9.6 oz) 75.3 kg (166 lb) 76.8 kg (169 lb 5 oz)    Exam:   General: alert, more sleepy, oriented to self  Cardiovascular: S1-S2 present, no added hrt sound  Respiratory: Chest clear bilaterally  Abdomen: Soft, non tender, non-distended, BS present  Musculoskeletal: No bilateral pedal edema  Skin: Normal  Psychiatry: Normal mood   Data Reviewed: CBC: Recent Labs  Lab 04/06/17 0403 04/07/17 0638 04/08/17 0409 04/09/17 0352 04/10/17 0611  WBC 13.2* 16.3* 15.3* 13.9* 15.0*  NEUTROABS 9.6* 11.5* 9.6* 9.0* 9.2*  HGB 11.2* 11.0* 10.0* 11.5* 12.2*  HCT 37.3* 35.9* 32.6* 37.8* 38.2*  MCV 95.6 95.5 94.2 94.5 92.3  PLT 96* 123* 131* 187 536   Basic Metabolic Panel: Recent Labs  Lab 04/04/17 0225 04/05/17 0243  04/06/17 1604 04/07/17 0638 04/08/17 0409 04/09/17 0352 04/10/17 0611  NA 153* 153*   < > 151* 150* 144 148* 134*  K 3.4* 3.4*   < > 3.4* 3.6 3.4* 4.3 4.5  CL 120* 118*   < > 118* 116* 110 114* 101  CO2 30 31   < > _0 GLUCOSE 104* 103*   < > 128* 98 94 83 88  BUN 7 6   < > _1 CREATININE 1.09 1.11   < > 1.18 1.13 0.99 1.15 1.04  CALCIUM 9.4 9.3   < > 9.2 9.1 8.6* 9.2 9.2  PHOS 3.6 2.9  --  3.2  --   --   --   --    < > = values in this interval not  displayed.   GFR: Estimated Creatinine Clearance: 91.6 mL/min (by C-G formula based on SCr of 1.04 mg/dL). Liver Function Tests: Recent Labs  Lab 04/04/17 0225 04/05/17 0243 04/06/17 1604  ALBUMIN 2.2* 2.3* 2.6*   No results for input(s): LIPASE, AMYLASE in the last 168 hours. No results for input(s): AMMONIA in the last 168 hours. Coagulation Profile: No results for input(s): INR, PROTIME in the last 168 hours. Cardiac Enzymes: No results for input(s): CKTOTAL, CKMB, CKMBINDEX, TROPONINI in the last 168 hours. BNP (last 3 results) No results for input(s): PROBNP in the last 8760 hours. HbA1C: No results for input(s): HGBA1C in the last 72 hours. CBG: Recent Labs  Lab 04/09/17 1121 04/09/17 1629 04/09/17 2058 04/10/17 0739 04/10/17 1144  GLUCAP 82 91 84 95 99   Lipid Profile: No results for input(s): CHOL, HDL, LDLCALC, TRIG, CHOLHDL, LDLDIRECT in the last 72 hours. Thyroid Function Tests: No results for input(s): TSH, T4TOTAL, FREET4, T3FREE, THYROIDAB in the last 72 hours. Anemia Panel: No results for input(s): VITAMINB12, FOLATE, FERRITIN, TIBC, IRON, RETICCTPCT  in the last 72 hours. Urine analysis:    Component Value Date/Time   COLORURINE YELLOW 03/27/2017 2226   APPEARANCEUR CLEAR 03/27/2017 2226   APPEARANCEUR Clear 02/04/2015 0940   LABSPEC 1.011 03/27/2017 2226   PHURINE 5.0 03/27/2017 2226   GLUCOSEU NEGATIVE 03/27/2017 2226   HGBUR NEGATIVE 03/27/2017 2226   BILIRUBINUR NEGATIVE 03/27/2017 2226   BILIRUBINUR Negative 02/04/2015 0940   KETONESUR NEGATIVE 03/27/2017 2226   PROTEINUR NEGATIVE 03/27/2017 2226   UROBILINOGEN 1.0 02/04/2015 1631   NITRITE NEGATIVE 03/27/2017 2226   LEUKOCYTESUR NEGATIVE 03/27/2017 2226   LEUKOCYTESUR Negative 02/04/2015 0940   Sepsis Labs: _0 (procalcitonin:4,lacticidven:4)  ) No results found for this or any previous visit (from the past 240 hour(s)).    Studies: No results found.  Scheduled Meds: .  aMILoride  10 mg Oral Daily  . amoxicillin-clavulanate  875 mg of amoxicillin Oral Q12H  . benzonatate  100 mg Oral Q8H  . diltiazem  60 mg Oral Q8H  . free water  200 mL Oral Q8H  . heparin  5,000 Units Subcutaneous Q8H  . LORazepam  2 mg Oral BID  . montelukast  10 mg Oral QHS  . risperiDONE microspheres  50 mg Intramuscular Q14 Days  . temazepam  30 mg Oral QHS  . Valproate Sodium  250 mg Oral Q6H    Continuous Infusions: . dextrose 50 mL/hr at 04/10/17 0959     LOS: 14 days     Alma Friendly, MD [facility]  If 7PM-7AM, please contact night-coverage [email or web address] Password [facility] 04/10/2017, 4:29 PM

## 2017-04-10 NOTE — Progress Notes (Signed)
Assessment/Plan: 1. Hypernatremia-due to NDI from lithium, worse on less IVFs- now onvershot with sodium of 134- will dec D5 to 50 per hour and check sodium later today - ad lib fluid and ice intake 2. Aspiration pneumonia- on augmentin per primary 3. Schizoaffective disorder- agree with holding lithium and cont with depakote, ativan, risperidone per primary. 4. Dysphagia- cont with thickened liquids. 5. 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  Rec: Allow him ad libitum fluids PO and wean off IVFs, not sure if he will be able to maintain adequate intake with polyuria ?          He should never be restarted on Lithium   Subjective: Interval History: OK- seems out of it- do not know baseline- 6600 of urine  Objective: Vital signs in last 24 hours: Temp:  [97.4 F (36.3 C)-98.2 F (36.8 C)] 98.2 F (36.8 C) (12/02 2059) Pulse Rate:  [79-86] 79 (12/02 2059) Resp:  [18] 18 (12/02 1200) BP: (97-102)/(54-61) 102/54 (12/02 2059) SpO2:  [97 %-99 %] 97 % (12/02 2059) Weight change:   Intake/Output from previous day: 12/02 0701 - 12/03 0700 In: 5903.3 [P.O.:3390; I.V.:2313.3; NG/GT:200] Out: 6650 [Urine:6650] Intake/Output this shift: Total I/O In: 360 [P.O.:360] Out: -     gen- slow- difficult to awaken Ext with some wrinkles no edema   Lab Results: Recent Labs    04/09/17 0352 04/10/17 0611  WBC 13.9* 15.0*  HGB 11.5* 12.2*  HCT 37.8* 38.2*  PLT 187 218   BMET:  Recent Labs    04/09/17 0352 04/10/17 0611  NA 148* 134*  K 4.3 4.5  CL 114* 101  CO2 26 26  GLUCOSE 83 88  BUN 12 13  CREATININE 1.15 1.04  CALCIUM 9.2 9.2   No results for input(s): PTH in the last 72 hours. Iron Studies: No results for input(s): IRON, TIBC, TRANSFERRIN, FERRITIN in the last 72 hours. Studies/Results: No results found.  Scheduled: . aMILoride  10 mg Oral Daily  . amoxicillin-clavulanate  875 mg of amoxicillin Oral Q12H  . benzonatate  100 mg Oral Q8H  . diltiazem  60  mg Oral Q8H  . free water  200 mL Oral Q8H  . heparin  5,000 Units Subcutaneous Q8H  . LORazepam  2 mg Oral BID  . montelukast  10 mg Oral QHS  . risperiDONE microspheres  50 mg Intramuscular Q14 Days  . temazepam  30 mg Oral QHS  . Valproate Sodium  250 mg Oral Q6H   Continuous: . dextrose 100 mL/hr at 04/10/17 0835      LOS: 14 days   Edelyn Heidel A 04/10/2017,9:46 AM

## 2017-04-10 NOTE — Plan of Care (Signed)
  Health Behavior/Discharge Planning: Ability to manage health-related needs will improve 04/09/2017 2007 - Progressing by Rolm Baptise, RN   Clinical Measurements: Diagnostic test results will improve 04/10/2017 4944 - Progressing by Rolm Baptise, RN Note BMP lab drawn with improvement to Na levels Cardiovascular complication will be avoided 04/09/2017 2007 - Progressing by Rolm Baptise, RN   Clinical Measurements: Cardiovascular complication will be avoided 04/09/2017 2007 - Progressing by Rolm Baptise, RN   Coping: Level of anxiety will decrease 04/10/2017 0633 by Rolm Baptise, RN Note Calm and restful  04/09/2017 2007 - Progressing by Rolm Baptise, RN   Elimination: Will not experience complications related to urinary retention 04/09/2017 2007 - Progressing by Rolm Baptise, RN   Skin Integrity: Risk for impaired skin integrity will decrease 04/09/2017 2007 - Progressing by Rolm Baptise, RN

## 2017-04-10 NOTE — Progress Notes (Signed)
  Speech Language Pathology Treatment: Dysphagia  Patient Details Name: Shane Gentry MRN: [id number] DOB: [birth year] Today's Date: 04/10/2017 Time: 1348-1400 SLP Time Calculation (min) (ACUTE ONLY): 12 min  Assessment / Plan / Recommendation Clinical Impression  Followed up with pt since upgrading liquids to thin on 11/30. Today pt immediately coughing and delayed throat clear following every sip thin liquid, cup and straw (RN witnessed coughing with liquids).  Pt needs max multimodal cues to slow down but in working with pt for several weeks and his intellectual disabilities, he is unable to follow precautions even with staff member present and cueing pt. Aspiration risk high and pt had to be transferred to ICU once this admission due to suspected aspiration. SLP will downgrade his liquids to nectar thick and continue Dys 2.    HPI HPI: 41 year old male living in a group home with a history of ADD, anxiety, asthma, depression, schizoaffective disorder from a group home who presented to the ED with AMS and lethargy. ED visit on 11/16 for evaluation of urinary frequency and being off balance.CT no acute abnormality and pt discharged. Chest x-ray revealed patchy bibasilar airspace opacification is worrisome for pneumonia. MBS 11/20 recommended Dys 2, thin liquids, aspiration event suspected and transferred to ICU; wet vocal quality and congestion and SLP initiated honey thick liquids then downgraded to nectar. Repeat MBS today assess current swallow function and recommend safest  po's prior to discharge.       SLP Plan  Continue with current plan of care       Recommendations  Diet recommendations: Dysphagia 2 (fine chop);Nectar-thick liquid Liquids provided via: Cup;No straw Medication Administration: Crushed with puree Supervision: Patient able to self feed;Full supervision/cueing for compensatory strategies Compensations: Minimize environmental distractions;Slow rate;Small  sips/bites Postural Changes and/or Swallow Maneuvers: Seated upright 90 degrees                Oral Care Recommendations: Oral care BID Follow up Recommendations: 24 hour supervision/assistance SLP Visit Diagnosis: Dysphagia, oropharyngeal phase (R13.12) Plan: Continue with current plan of care       GO                [name] 04/10/2017, 2:06 PM  [name] [employer] [phone]

## 2017-04-10 NOTE — Clinical Social Work Note (Addendum)
30 day note for [facility] not signed. CSW paged MD.  Dayton Scrape, [place]  2:32 pm 30 day note signed by MD. Requested documents uploaded to [facility] for [facility] review. CSW left voicemail for legal guardian. Will discuss only bed offer when she returns call: [facility].  Dayton Scrape, [place]

## 2017-04-11 LAB — BASIC METABOLIC PANEL
Anion gap: 10 (ref 5–15)
BUN: 16 mg/dL (ref 6–20)
CO2: 24 mmol/L (ref 22–32)
Calcium: 9.4 mg/dL (ref 8.9–10.3)
Chloride: 97 mmol/L — ABNORMAL LOW (ref 101–111)
Creatinine, Ser: 1.15 mg/dL (ref 0.61–1.24)
GFR calc Af Amer: >60 mL/min (ref >60)
GLUCOSE: 88 mg/dL (ref 65–99)
POTASSIUM: 5 mmol/L (ref 3.5–5.1)
SODIUM: 131 mmol/L — AB (ref 135–145)

## 2017-04-11 LAB — CBC WITH DIFFERENTIAL/PLATELET
Basophils Absolute: 0 10*3/uL (ref 0.0–0.1)
Basophils Relative: 0 %
EOS ABS: 0.1 10*3/uL (ref 0.0–0.7)
Eosinophils Relative: 1 %
HEMATOCRIT: 40.2 % (ref 39.0–52.0)
HEMOGLOBIN: 13 g/dL (ref 13.0–17.0)
LYMPHS ABS: 3 10*3/uL (ref 0.7–4.0)
LYMPHS PCT: 21 %
MCH: 29.4 pg (ref 26.0–34.0)
MCHC: 32.3 g/dL (ref 30.0–36.0)
MCV: 91 fL (ref 78.0–100.0)
MONOS PCT: 15 %
Monocytes Absolute: 2.1 10*3/uL — ABNORMAL HIGH (ref 0.1–1.0)
NEUTROS ABS: 9.3 10*3/uL — AB (ref 1.7–7.7)
NEUTROS PCT: 63 %
Platelets: 272 10*3/uL (ref 150–400)
RBC: 4.42 MIL/uL (ref 4.22–5.81)
RDW: 15.9 % — ABNORMAL HIGH (ref 11.5–15.5)
WBC: 14.6 10*3/uL — AB (ref 4.0–10.5)

## 2017-04-11 LAB — GLUCOSE, CAPILLARY
Glucose-Capillary: 99 mg/dL (ref 65–99)
Glucose-Capillary: 102 mg/dL — ABNORMAL HIGH (ref 65–99)
Glucose-Capillary: 88 mg/dL (ref 65–99)
Glucose-Capillary: 114 mg/dL — ABNORMAL HIGH (ref 65–99)

## 2017-04-11 MED ORDER — ACETAMINOPHEN 325 MG PO TABS
650.0000 mg | ORAL_TABLET | Freq: Four times a day (QID) | ORAL | Status: DC | PRN
  Administered 2017-04-11: 650 mg via ORAL
  Filled 2017-04-11: qty 2

## 2017-04-11 NOTE — Progress Notes (Signed)
Physical Therapy Treatment Patient Details Name: Shane Gentry MRN: [id number] DOB: 11-21-1975 Today's Date: 04/11/2017    History of Present Illness This 41 y.o. male admitted from his group home with AMS. Pt found to have sepsis due to UTI.   X-ray showed PNA, likely due to aspiration.  PMH includes:  MR + schizoaffective disorder, h/o SBO, chronic kidney disease    PT Comments    Pt in recliner upon arrival. Eager to get up and walk. Min guard during transfers for safety due to impulsiveness. Pt able to ambulate farther than last session; stated he was tired and wanted to turn around. VCs for lifting feet while walking. Pt would correct for a little bit then return to shuffling his feet; would increase gait speed, drag feet, and lean forward. Pt unsteady on feet especially when distracted but no LOB. Pt would benefit from skilled PT to increase safety awareness, strength, and endurance to improve functional independence.   Follow Up Recommendations  Supervision/Assistance - 24 hour;SNF     Equipment Recommendations  Wheelchair (measurements PT)    Recommendations for Other Services       Precautions / Restrictions Precautions Precautions: Fall Precaution Comments: Posterior LOB in standing, no corrective responses noted  Restrictions Weight Bearing Restrictions: No    Mobility  Bed Mobility               General bed mobility comments: Pt in recliner upon entry.  Transfers Overall transfer level: Needs assistance Equipment used: None Transfers: Sit to/from Stand Sit to Stand: Min guard         General transfer comment: Min guard for safety.  Ambulation/Gait Ambulation/Gait assistance: Min assist Ambulation Distance (Feet): 460 Feet Assistive device: None Gait Pattern/deviations: Step-through pattern;Decreased stride length;Wide base of support;Shuffle;Decreased dorsiflexion - right;Festinating     General Gait Details: Pt impulsive with gait. Max cues to  pick up feet as walking; pt would do this but then exaggerate motion and begin marching. When pt doesn't pick his feet up, he begins to take shorter steps, lean forward, and increase his speed. He has difficultly maintaining balance as ambulating at quicker pace. Becomes distracted and more unsteady with gait.   Stairs            Wheelchair Mobility    Modified Rankin (Stroke Patients Only)       Balance Overall balance assessment: Needs assistance Sitting-balance support: Feet supported;No upper extremity supported Sitting balance-Leahy Scale: Fair Sitting balance - Comments: static balance okay   Standing balance support: No upper extremity supported Standing balance-Leahy Scale: Fair Standing balance comment: Unsteady on feet while ambulating. No LOB.                            Cognition Arousal/Alertness: Awake/alert Behavior During Therapy: WFL for tasks assessed/performed Overall Cognitive Status: No family/caregiver present to determine baseline cognitive functioning                                        Exercises      General Comments        Pertinent Vitals/Pain Pain Assessment: No/denies pain Pain Intervention(s): Monitored during session;Repositioned    Home Living                      Prior Function  PT Goals (current goals can now be found in the care plan section) Acute Rehab PT Goals Patient Stated Goal: None stated PT Goal Formulation: Patient unable to participate in goal setting Time For Goal Achievement: 04/13/17 Potential to Achieve Goals: Good Progress towards PT goals: Progressing toward goals    Frequency    Min 3X/week      PT Plan Current plan remains appropriate    Co-evaluation              AM-PAC PT "6 Clicks" Daily Activity  Outcome Measure  Difficulty turning over in bed (including adjusting bedclothes, sheets and blankets)?: A Little Difficulty moving from  lying on back to sitting on the side of the bed? : A Little Difficulty sitting down on and standing up from a chair with arms (e.g., wheelchair, bedside commode, etc,.)?: Unable Help needed moving to and from a bed to chair (including a wheelchair)?: A Little Help needed walking in hospital room?: A Little Help needed climbing 3-5 steps with a railing? : A Lot 6 Click Score: 15    End of Session Equipment Utilized During Treatment: Gait belt Activity Tolerance: Patient tolerated treatment well Patient left: in chair;with call bell/phone within reach;with chair alarm set Nurse Communication: Mobility status PT Visit Diagnosis: Other symptoms and signs involving the nervous system (R29.898);Other abnormalities of gait and mobility (R26.89);Unsteadiness on feet (R26.81)     Time: 1047-1100 PT Time Calculation (min) (ACUTE ONLY): 13 min  Charges:  $Gait Training: 8-22 mins                    G Codes:       Janna Arch, SPTA   Janna Arch 04/11/2017, 11:41 AM

## 2017-04-11 NOTE — Progress Notes (Signed)
PROGRESS NOTE  Shane Gentry YFV:[id number] DOB: 12/31/1975 DOA: 03/27/2017 PCP: [name], MD  HPI/Recap of past 80 hours: 41 year old long-term resident of group home with hx of prior SBO, HTN, HLD, PAF, failure to thrive, hypothyroidism, schizoaffective disorder + MR, prior dysphagia, severe chronic constipation, presented from group home with altered mental status, cough. On arrival 11/19 to emergency room core temperature of 88 patient was given warm fluids then chest x-ray revealed bilateral infiltrates suggestive of pneumonia, glucose was 60 patient received 2-1/2 L normal saline for presumed aspiration pneumonia. Pt had recurrent aspiration and had to go to the ICU on 11/23, however has improved but currently still on AB.  Today, reported feeling good. Denied any chest pain, SOB, abdominal pain, N/V/D/C, fever/chills. Discussed with RN to encourage liberal oral fluid intake, but should be monitored closely   Assessment/Plan: Principal Problem:   Aspiration pneumonitis (HCC) Active Problems:   Schizoaffective disorder, depressive type ([place])   Failure to thrive in adult   Hyperthyroidism   Sepsis secondary to UTI ([place])   Sepsis ([place])   Aspiration pneumonia ([place])  #Recurrent aspiration pneumonia Afebrile, resolving leukocytosis, not requiring O2 SLP has eva speech therapy evaluated recommend DYS 2 nectar thick Was de-escalated to po levaquin 11/22 D/C vanc/zosyn on 11/23-tapered to Augmentin 11/25, stopped 04/11/17 (Total of 10 days) Bc x 2 NGTD, pct lower D/C solu cortef 100 q8-->50 q12, titrated down and discontinued pred  #Nephrogenic diabetes insipidus Hypernatremia, polyuria, lithium use Na 159 on 04/06/17, currently resolving Nephrology consulted: Started amiloride 40m daily. If no response, can consider HCTZ/indomethacin Discontinue IV D5W, encourage liberal oral fliud intake. If Na remains stable on 04/12/17, can be discharged as per nephrology Encouraging  oral fluids as well 200 q8H. 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  Resolved, baseline creatinine anywhere from 1.0-1.2 ?nephrogenic induced Lithium DI BUN/creatinine better but still hypernatremic Daily BMP  #P.Aflutter Controlled S/P Cardizem gtt [facility]: LVEF 65-70%, Grade 1DD Cardiology on board: Continue PO [facility]iltiazem, [facility] #Persistent hypoglycemia Resolved Etiology is unclear Because of his persistent hypoglycemia we will rule out rare causes such as insulinoma nursing order has been placed for insulin level, C-peptide, beta-hydroxybutyrate and proinsulin levels Follow up as an outpt  #Acute hypoxic resp failure Resolved Probably 2/2 pna CXR 11/25 shows fluid infection Repeat chest x-ray 1 view 11/26 with unchanged infiltrates suggestive of PNA  PRN O2  #Met encephalopathy Resolved 2/2 to sepsis vs hypoglycemia  #Hypothyroidism TSH slightly elevated at 7.7, likely due to acute illness Outpt follow up required  #Affective disorder, MR reported Stable Resuming home meds 11/24 Risperdal Consta 50 Q 14--ordered for 11/29 temazepam 30 at bedtime, Depakote 1000 at bedtime  lorazepam 2 mg twice daily Discontinue lithium due to nephrogenic diabetes insipidus. Never to be used in this patient  #HTN holding metoprolol XL 25 due to soft BP  #Hyperlipidemia  Continue atorvastatin 20  #Chronic severe constipation  Continue MiraLAX 17 g daily ensure proper bowel regimen  #Thrombocytopenia, unclear cause [facility] but no information currently available at this time DIC panel is negative think this is probably just medication related    Code Status: Full  Family Communication: None at bedside  Disposition Plan: Possibly back to NH by  04/12/17  Consultants:  Nephrology  Procedures:  None   Antimicrobials:  PO Augmentin, completed 04/11/17  DVT prophylaxis:  Heparin Hartrandt   Objective: Vitals:   04/10/17 1405 04/10/17 2034 04/11/17 0603 04/11/17 1141  BP: 101/63 101/62 (!) 104/58 107/63  Pulse: 91 96 85 82  Resp: 18 18 20    Temp: 98.2 F (36.8 C) 99.2 F (37.3 C) 98.8 F (37.1 C)   TempSrc: Oral Oral Oral   SpO2: 93% 96% 100% 100%  Weight:   65.5 kg (144 lb 6.4 oz)   Height:        Intake/Output Summary (Last 24 hours) at 04/11/2017 1159 Last data filed at 04/11/2017 0954 Gross per 24 hour  Intake 4012.17 ml  Output 4725 ml  Net -712.83 ml   Filed Weights   04/07/17 0619 04/08/17 0543 04/11/17 0603  Weight: 75.3 kg (166 lb) 76.8 kg (169 lb 5 oz) 65.5 kg (144 lb 6.4 oz)    Exam:   General: alert, awake, oriented to self  Cardiovascular: S1-S2 present, no added hrt sound  Respiratory: Chest clear bilaterally  Abdomen: Soft, non tender, non-distended, BS present  Musculoskeletal: No bilateral pedal edema  Skin: Normal  Psychiatry: Normal mood   Data Reviewed: CBC: Recent Labs  Lab 04/07/17 0638 04/08/17 0409 04/09/17 0352 04/10/17 0611 04/11/17 0552  WBC 16.3* 15.3* 13.9* 15.0* 14.6*  NEUTROABS 11.5* 9.6* 9.0* 9.2* 9.3*  HGB 11.0* 10.0* 11.5* 12.2* 13.0  HCT 35.9* 32.6* 37.8* 38.2* 40.2  MCV 95.5 94.2 94.5 92.3 91.0  PLT 123* 131* 187 218 295   Basic Metabolic Panel: Recent Labs  Lab 04/05/17 0243  04/06/17 1604  04/08/17 0409 04/09/17 0352 04/10/17 0611 04/10/17 1522 04/11/17 0552  NA 153*   < > 151*   < > 144 148* 134* 132* 131*  K 3.4*   < > 3.4*   < > 3.4* 4.3 4.5 4.9 5.0  CL 118*   < > 118*   < > 110 114* 101 98* 97*  CO2 31   < > 28   < > 30 26 26 26 24   GLUCOSE 103*   < > 128*   < > 94 83 88 92 88  BUN 6   < > 10   < > 7 12 13 13 16   CREATININE 1.11   < > 1.18   < > 0.99 1.15 1.04 1.21 1.15  CALCIUM 9.3   < > 9.2   < > 8.6* 9.2 9.2 9.6 9.4  PHOS 2.9  --   3.2  --   --   --   --  4.2  --    < > = values in this interval not displayed.   GFR: Estimated Creatinine Clearance: 76.7 mL/min (by C-G formula based on SCr of 1.15 mg/dL). Liver Function Tests: Recent Labs  Lab 04/05/17 0243 04/06/17 1604 04/10/17 1522  ALBUMIN 2.3* 2.6* 2.8*   No results for input(s): LIPASE, AMYLASE in the last 168 hours. No results for input(s): AMMONIA in the last 168 hours. Coagulation Profile: No results for input(s): INR, PROTIME in the last 168 hours. Cardiac Enzymes: No results for input(s): CKTOTAL, CKMB, CKMBINDEX, TROPONINI in the last 168 hours. BNP (last 3 results) No results for input(s): PROBNP in the last 8760 hours. HbA1C: No results for input(s): HGBA1C in the last 72 hours. CBG: Recent Labs  Lab 04/10/17 1144 04/10/17 1656 04/10/17 2056 04/11/17 0728 04/11/17 1140  GLUCAP 99 106* 93 99 102*   Lipid  Profile: No results for input(s): CHOL, HDL, LDLCALC, TRIG, CHOLHDL, LDLDIRECT in the last 72 hours. Thyroid Function Tests: No results for input(s): TSH, T4TOTAL, FREET4, T3FREE, THYROIDAB in the last 72 hours. Anemia Panel: No results for input(s): VITAMINB12, FOLATE, FERRITIN, TIBC, IRON, RETICCTPCT in the last 72 hours. Urine analysis:    Component Value Date/Time   COLORURINE YELLOW 03/27/2017 2226   APPEARANCEUR CLEAR 03/27/2017 2226   APPEARANCEUR Clear 02/04/2015 0940   LABSPEC 1.011 03/27/2017 2226   PHURINE 5.0 03/27/2017 2226   GLUCOSEU NEGATIVE 03/27/2017 2226   HGBUR NEGATIVE 03/27/2017 2226   BILIRUBINUR NEGATIVE 03/27/2017 2226   BILIRUBINUR Negative 02/04/2015 0940   KETONESUR NEGATIVE 03/27/2017 2226   PROTEINUR NEGATIVE 03/27/2017 2226   UROBILINOGEN 1.0 02/04/2015 1631   NITRITE NEGATIVE 03/27/2017 2226   LEUKOCYTESUR NEGATIVE 03/27/2017 2226   LEUKOCYTESUR Negative 02/04/2015 0940   Sepsis Labs: @LABRCNTIP (procalcitonin:4,lacticidven:4)  ) No results found for this or any previous visit (from the  past 240 hour(s)).    Studies: No results found.  Scheduled Meds: . aMILoride  10 mg Oral Daily  . amoxicillin-clavulanate  875 mg of amoxicillin Oral Q12H  . benzonatate  100 mg Oral Q8H  . diltiazem  60 mg Oral Q8H  . free water  200 mL Oral Q8H  . heparin  5,000 Units Subcutaneous Q8H  . LORazepam  2 mg Oral BID  . montelukast  10 mg Oral QHS  . risperiDONE microspheres  50 mg Intramuscular Q14 Days  . temazepam  30 mg Oral QHS  . Valproate Sodium  250 mg Oral Q6H    Continuous Infusions:    LOS: 15 days     Alma Friendly, MD [facility]  If 7PM-7AM, please contact night-coverage [email or web address] Password [email or web address] 04/11/2017, 11:59 AM

## 2017-04-11 NOTE — Clinical Social Work Note (Signed)
PASARR obtained: [phone] E. CSW spoke with patient's guardian, Danae Chen Fears. She is agreeable to patient discharging to [facility] once stable. SNF hospital liaison notified.  Dayton Scrape, [place]

## 2017-04-11 NOTE — Progress Notes (Signed)
Assessment/Plan: 1. Hypernatremia-due to NDI from lithium, worse on less IVFs- now onvershot with sodium of 131- will stop D5  and check sodium in AM - ad lib fluid and ice intake.  Also on amiloride 2. Aspiration pneumonia- on augmentin per primary 3. Schizoaffective disorder- agree with holding lithium and cont with depakote, ativan, risperidone per primary. 4. Dysphagia- cont with thickened liquids. 5. 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  Rec: Allow him ad lib  fluids PO and stop IVFs, not sure if he will be able to maintain adequate intake with polyuria ?     If sodium is OK tomorrow I think he could go back to SNF- sodium should be checked once a week for a month and managed with free water if it increases.  He does not need renal specific follow up.     He should never be restarted on Lithium   Subjective: Interval History: more engaging today - do not know baseline- 4300 of urine  Objective: Vital signs in last 24 hours: Temp:  [98.2 F (36.8 C)-99.2 F (37.3 C)] 98.8 F (37.1 C) (12/04 0603) Pulse Rate:  [85-96] 85 (12/04 0603) Resp:  [18-20] 20 (12/04 0603) BP: (101-104)/(58-74) 104/58 (12/04 0603) SpO2:  [93 %-100 %] 100 % (12/04 0603) Weight:  [65.5 kg (144 lb 6.4 oz)] 65.5 kg (144 lb 6.4 oz) (12/04 0603) Weight change:   Intake/Output from previous day: 12/03 0701 - 12/04 0700 In: 4252.2 [P.O.:1678; I.V.:2094.2; NG/GT:480] Out: 4325 [Urine:4325] Intake/Output this shift: Total I/O In: 120 [P.O.:120] Out: -     gen- slow- more alert  Ext with some wrinkles no edema   Lab Results: Recent Labs    04/10/17 0611 04/11/17 0552  WBC 15.0* 14.6*  HGB 12.2* 13.0  HCT 38.2* 40.2  PLT 218 272   BMET:  Recent Labs    04/10/17 1522 04/11/17 0552  NA 132* 131*  K 4.9 5.0  CL 98* 97*  CO2 26 24  GLUCOSE 92 88  BUN 13 16  CREATININE 1.21 1.15  CALCIUM 9.6 9.4   No results for input(s): PTH in the last 72 hours. Iron Studies: No results for  input(s): IRON, TIBC, TRANSFERRIN, FERRITIN in the last 72 hours. Studies/Results: No results found.  Scheduled: . aMILoride  10 mg Oral Daily  . amoxicillin-clavulanate  875 mg of amoxicillin Oral Q12H  . benzonatate  100 mg Oral Q8H  . diltiazem  60 mg Oral Q8H  . free water  200 mL Oral Q8H  . heparin  5,000 Units Subcutaneous Q8H  . LORazepam  2 mg Oral BID  . montelukast  10 mg Oral QHS  . risperiDONE microspheres  50 mg Intramuscular Q14 Days  . temazepam  30 mg Oral QHS  . Valproate Sodium  250 mg Oral Q6H   Continuous: . dextrose 50 mL/hr at 04/11/17 0449      LOS: 15 days   Tandy Lewin A 04/11/2017,8:57 AM

## 2017-04-11 NOTE — Progress Notes (Signed)
Patients morning weight is off. Weighed twice- 144.4lb, 145.3lb.

## 2017-04-11 NOTE — Plan of Care (Signed)
  Clinical Measurements: Ability to maintain clinical measurements within normal limits will improve 04/11/2017 0141 - Progressing by Tristan Schroeder, RN   Activity: Ability to tolerate increased activity will improve 04/11/2017 0141 - Progressing by Tristan Schroeder, RN

## 2017-04-11 NOTE — Progress Notes (Addendum)
Occupational Therapy Treatment Patient Details Name: Shane Gentry MRN: [id number] DOB: 1975-06-10 Today's Date: 04/11/2017    History of present illness This 41 y.o. male admitted from his group home with AMS. Pt found to have sepsis due to UTI.   X-ray showed PNA, likely due to aspiration.  PMH includes:  MR + schizoaffective disorder, h/o SBO, chronic kidney disease   OT comments  Pt is demonstrating good progress toward OT goals. Updated care plans section to reflect progress. He was able to complete ambulating toilet transfer with min assist for balance. He was additionally able to complete LB dressing tasks with min guard assist this session. Pt benefits from verbally reviewing steps of an activity prior to completion. Additionally, he was able to stand at sink for grooming tasks with overall min guard assist. Pt would continue to benefit from SNF placement for rehabilitaiton; however, will continue to monitor and update recommendations based on progress.    Follow Up Recommendations  SNF;Supervision/Assistance - 24 hour    Equipment Recommendations       Recommendations for Other Services      Precautions / Restrictions Precautions Precautions: Fall Precaution Comments: Posterior LOB frequently Restrictions Weight Bearing Restrictions: No       Mobility Bed Mobility Overal bed mobility: Needs Assistance Bed Mobility: Supine to Sit     Supine to sit: Supervision     General bed mobility comments: VC's for sequencing and to avoid unsafe movement.   Transfers Overall transfer level: Needs assistance Equipment used: None Transfers: Sit to/from Stand Sit to Stand: Min guard         General transfer comment: Min guard assist for safety during power up. Min assist while completing functional mobility.     Balance Overall balance assessment: Needs assistance Sitting-balance support: Feet supported;No upper extremity supported Sitting balance-Leahy Scale:  Fair Sitting balance - Comments: static balance okay   Standing balance support: No upper extremity supported Standing balance-Leahy Scale: Fair Standing balance comment: Able to statically stand without assistance or UE support.                            ADL either performed or assessed with clinical judgement   ADL Overall ADL's : Needs assistance/impaired     Grooming: Oral care;Wash/dry hands;Standing;Min guard Grooming Details (indicate cue type and reason): Min guard for balance                 Toilet Transfer: Minimal assistance;Cueing for sequencing;Ambulation Toilet Transfer Details (indicate cue type and reason): Min assist to ambulate in room and to bathroom. Cues for sequencing. Stood to urinate         Functional mobility during ADLs: Minimal assistance General ADL Comments: Assist for balance. Tends to lose balance posteriorly     Vision       Perception     Praxis      Cognition Arousal/Alertness: Awake/alert Behavior During Therapy: WFL for tasks assessed/performed Overall Cognitive Status: No family/caregiver present to determine baseline cognitive functioning                                 General Comments: Able to follow one-step commands. Shane Gentry responds well when he knows what the plan will be step-by-step.         Exercises     Shoulder Instructions       General Comments  Pertinent Vitals/ Pain       Pain Assessment: No/denies pain Pain Intervention(s): Monitored during session;Repositioned  Home Living                                          Prior Functioning/Environment              Frequency  Min 2X/week        Progress Toward Goals  OT Goals(current goals can now be found in the care plan section)  Progress towards OT goals: Progressing toward goals;Goals met and updated - see care plan  Acute Rehab OT Goals Patient Stated Goal: None stated OT Goal  Formulation: Patient unable to participate in goal setting Time For Goal Achievement: 04/20/17 Potential to Achieve Goals: Good  Plan Discharge plan remains appropriate    Co-evaluation                 AM-PAC PT "6 Clicks" Daily Activity     Outcome Measure   Help from another person eating meals?: A Little Help from another person taking care of personal grooming?: A Little Help from another person toileting, which includes using toliet, bedpan, or urinal?: A Little Help from another person bathing (including washing, rinsing, drying)?: A Little Help from another person to put on and taking off regular upper body clothing?: A Little Help from another person to put on and taking off regular lower body clothing?: A Little 6 Click Score: 18    End of Session Equipment Utilized During Treatment: Gait belt  OT Visit Diagnosis: Unsteadiness on feet (R26.81)   Activity Tolerance Patient tolerated treatment well   Patient Left in chair;with chair alarm set   Nurse Communication Mobility status(pt up in chair with alarm set)        Time: 3291-9166 OT Time Calculation (min): 28 min  Charges: OT General Charges $OT Visit: 1 Visit OT Treatments $Self Care/Home Management : 23-37 mins  Norman Herrlich, MS OTR/L  Pager: [place] A Evani Shrider 04/11/2017, 2:39 PM

## 2017-04-12 LAB — CBC WITH DIFFERENTIAL/PLATELET
Basophils Absolute: 0 10*3/uL (ref 0.0–0.1)
Basophils Relative: 0 %
EOS ABS: 0.1 10*3/uL (ref 0.0–0.7)
EOS PCT: 1 %
HCT: 42.5 % (ref 39.0–52.0)
HEMOGLOBIN: 14 g/dL (ref 13.0–17.0)
LYMPHS ABS: 2.3 10*3/uL (ref 0.7–4.0)
LYMPHS PCT: 19 %
MCH: 29.9 pg (ref 26.0–34.0)
MCHC: 32.9 g/dL (ref 30.0–36.0)
MCV: 90.8 fL (ref 78.0–100.0)
MONOS PCT: 14 %
Monocytes Absolute: 1.7 10*3/uL — ABNORMAL HIGH (ref 0.1–1.0)
NEUTROS PCT: 66 %
Neutro Abs: 7.9 10*3/uL — ABNORMAL HIGH (ref 1.7–7.7)
Platelets: 281 10*3/uL (ref 150–400)
RBC: 4.68 MIL/uL (ref 4.22–5.81)
RDW: 15.8 % — ABNORMAL HIGH (ref 11.5–15.5)
WBC: 12.1 10*3/uL — ABNORMAL HIGH (ref 4.0–10.5)

## 2017-04-12 LAB — BASIC METABOLIC PANEL
ANION GAP: 12 (ref 5–15)
ANION GAP: 15 (ref 5–15)
Anion gap: 10 (ref 5–15)
BUN: 21 mg/dL — AB (ref 6–20)
BUN: 22 mg/dL — AB (ref 6–20)
BUN: 23 mg/dL — ABNORMAL HIGH (ref 6–20)
CHLORIDE: 101 mmol/L (ref 101–111)
CHLORIDE: 94 mmol/L — AB (ref 101–111)
CO2: 21 mmol/L — ABNORMAL LOW (ref 22–32)
CO2: 25 mmol/L (ref 22–32)
CO2: 21 mmol/L — ABNORMAL LOW (ref 22–32)
CREATININE: 1.12 mg/dL (ref 0.61–1.24)
Calcium: 9.9 mg/dL (ref 8.9–10.3)
Calcium: 10.2 mg/dL (ref 8.9–10.3)
Calcium: 9.2 mg/dL (ref 8.9–10.3)
Chloride: 94 mmol/L — ABNORMAL LOW (ref 101–111)
Creatinine, Ser: 1.44 mg/dL — ABNORMAL HIGH (ref 0.61–1.24)
Creatinine, Ser: 1.29 mg/dL — ABNORMAL HIGH (ref 0.61–1.24)
GFR calc Af Amer: >60 mL/min (ref >60)
GFR calc Af Amer: >60 mL/min (ref >60)
GFR calc non Af Amer: >60 mL/min (ref >60)
GFR calc non Af Amer: 58 mL/min — ABNORMAL LOW (ref >60)
GLUCOSE: 91 mg/dL (ref 65–99)
GLUCOSE: 128 mg/dL — AB (ref 65–99)
GLUCOSE: 81 mg/dL (ref 65–99)
POTASSIUM: 6.1 mmol/L — AB (ref 3.5–5.1)
POTASSIUM: 6.2 mmol/L — AB (ref 3.5–5.1)
POTASSIUM: 5.6 mmol/L — AB (ref 3.5–5.1)
SODIUM: 132 mmol/L — AB (ref 135–145)
SODIUM: 130 mmol/L — AB (ref 135–145)
Sodium: 131 mmol/L — ABNORMAL LOW (ref 135–145)

## 2017-04-12 LAB — GLUCOSE, CAPILLARY
GLUCOSE-CAPILLARY: 89 mg/dL (ref 65–99)
GLUCOSE-CAPILLARY: 59 mg/dL — AB (ref 65–99)
GLUCOSE-CAPILLARY: 100 mg/dL — AB (ref 65–99)
GLUCOSE-CAPILLARY: 118 mg/dL — AB (ref 65–99)

## 2017-04-12 MED ORDER — LACTULOSE 10 GM/15ML PO SOLN
30.0000 g | Freq: Once | ORAL | Status: AC
  Administered 2017-04-12: 30 g via ORAL
  Filled 2017-04-12: qty 45

## 2017-04-12 MED ORDER — LACTULOSE 10 GM/15ML PO SOLN
20.0000 g | Freq: Once | ORAL | Status: AC
  Administered 2017-04-12: 20 g via ORAL
  Filled 2017-04-12: qty 30

## 2017-04-12 MED ORDER — DEXTROSE 50 % IV SOLN
1.0000 | Freq: Once | INTRAVENOUS | Status: AC
  Administered 2017-04-12: 50 mL via INTRAVENOUS
  Filled 2017-04-12: qty 50

## 2017-04-12 MED ORDER — SODIUM BICARBONATE 8.4 % IV SOLN
INTRAVENOUS | Status: DC
  Administered 2017-04-12 – 2017-04-13 (×2): via INTRAVENOUS
  Filled 2017-04-12 (×3): qty 150

## 2017-04-12 MED ORDER — POLYETHYLENE GLYCOL 3350 17 G PO PACK
17.0000 g | PACK | Freq: Every day | ORAL | Status: DC
  Administered 2017-04-12 – 2017-04-13 (×2): 17 g via ORAL
  Filled 2017-04-12 (×2): qty 1

## 2017-04-12 MED ORDER — SODIUM CHLORIDE 0.9 % IV SOLN
1.0000 g | Freq: Once | INTRAVENOUS | Status: AC
  Administered 2017-04-12: 1 g via INTRAVENOUS
  Filled 2017-04-12: qty 10

## 2017-04-12 MED ORDER — INSULIN ASPART 100 UNIT/ML IV SOLN
10.0000 [IU] | Freq: Once | INTRAVENOUS | Status: AC
  Administered 2017-04-12: 10 [IU] via INTRAVENOUS

## 2017-04-12 MED ORDER — SODIUM POLYSTYRENE SULFONATE 15 GM/60ML PO SUSP
30.0000 g | Freq: Once | ORAL | Status: AC
  Administered 2017-04-12: 30 g via ORAL
  Filled 2017-04-12: qty 120

## 2017-04-12 NOTE — Progress Notes (Signed)
Subjective:  No major change- IVFstopped yest- K is up today as well as crt- UOP 2200  Objective Vital signs in last 24 hours: Vitals:   04/11/17 1141 04/11/17 1336 04/11/17 2029 04/12/17 0530  BP: 107/63 112/66 110/60 92/62  Pulse: 82 89 95 97  Resp:   18 18  Temp:   98 F (36.7 C) 98.8 F (37.1 C)  TempSrc:   Oral Oral  SpO2: 100% 96% 98% 98%  Weight:    63.1 kg (139 lb 3.2 oz)  Height:       Weight change: -2.359 kg (-3.2 oz)  Intake/Output Summary (Last 24 hours) at 04/12/2017 0911 Last data filed at 04/12/2017 4627 Gross per 24 hour  Intake 1080 ml  Output 2250 ml  Net -1170 ml    Assessment/ Plan: Pt is a 41 y.o. yo male schizoaffective D/O in group home who was admitted on 03/27/2017 with altered MS, thought to have PNA- he was on lithium and had nephrogenic DI and hypernatremia  Assessment/Plan: 1. NDI- better with discontinuation of lithium and given water. Amiloride was also added.  Sodium if anything low of late, IVF have been stopped, is on ad lib fluids.  Now is hyperkalemic likely from amiloride so will stop it.  Hopefully can maintain sodium off of amiloride 2. Renal - stage 2 CKD- slightly worse maybe due to amiloride and low BP - stop amiloride 3. Hyperkalemia- given small dose of kaexylate this AM- should resolve once amiloride stopped  4. Psych- no on depakote.  Would recommend lithium not be restarted  5. HTN/volume- if anything BP is low - stop amiloride, is also on low dose cardizem  6.PNA- s/p treatment  Ivonna Kinnick A    Labs: Basic Metabolic Panel: Recent Labs  Lab 04/06/17 1604  04/10/17 1522 04/11/17 0552 04/12/17 0519 04/12/17 0821  NA 151*   < > 132* 131* 132* 131*  K 3.4*   < > 4.9 5.0 6.1* 6.2*  CL 118*   < > 98* 97* 101 94*  CO2 28   < > 26 24 21* 25  GLUCOSE 128*   < > 92 88 91 128*  BUN 10   < > 13 16 21* 22*  CREATININE 1.18   < > 1.21 1.15 1.12 1.44*  CALCIUM 9.2   < > 9.6 9.4 9.9 10.2  PHOS 3.2  --  4.2  --   --   --     < > = values in this interval not displayed.   Liver Function Tests: Recent Labs  Lab 04/06/17 1604 04/10/17 1522  ALBUMIN 2.6* 2.8*   No results for input(s): LIPASE, AMYLASE in the last 168 hours. No results for input(s): AMMONIA in the last 168 hours. CBC: Recent Labs  Lab 04/08/17 0409 04/09/17 0352 04/10/17 0611 04/11/17 0552 04/12/17 0519  WBC 15.3* 13.9* 15.0* 14.6* 12.1*  NEUTROABS 9.6* 9.0* 9.2* 9.3* 7.9*  HGB 10.0* 11.5* 12.2* 13.0 14.0  HCT 32.6* 37.8* 38.2* 40.2 42.5  MCV 94.2 94.5 92.3 91.0 90.8  PLT 131* 187 218 272 281   Cardiac Enzymes: No results for input(s): CKTOTAL, CKMB, CKMBINDEX, TROPONINI in the last 168 hours. CBG: Recent Labs  Lab 04/11/17 0728 04/11/17 1140 04/11/17 1618 04/11/17 2129 04/12/17 0739  GLUCAP 99 102* 88 114* 89    Iron Studies: No results for input(s): IRON, TIBC, TRANSFERRIN, FERRITIN in the last 72 hours. Studies/Results: No results found. Medications: Infusions:   Scheduled Medications: . aMILoride  10 mg  Oral Daily  . benzonatate  100 mg Oral Q8H  . diltiazem  60 mg Oral Q8H  . free water  200 mL Oral Q8H  . heparin  5,000 Units Subcutaneous Q8H  . LORazepam  2 mg Oral BID  . montelukast  10 mg Oral QHS  . risperiDONE microspheres  50 mg Intramuscular Q14 Days  . temazepam  30 mg Oral QHS  . Valproate Sodium  250 mg Oral Q6H    have reviewed scheduled and prn medications.  Physical Exam: General: alert, asking for ginger ale Heart: RRR Lungs: mostly clear Abdomen: soft,non tender Extremities: no edema    04/12/2017,9:11 AM  LOS: 16 days

## 2017-04-12 NOTE — Progress Notes (Signed)
PROGRESS NOTE    Shane Gentry  PFX:[id number] DOB: 17-May-1975 DOA: 03/27/2017 PCP: [name], MD    Brief Narrative: 41 year old long-term resident of group home with hx of prior SBO, HTN, HLD, PAF, failure to thrive, hypothyroidism, schizoaffective disorder + MR, prior dysphagia, severe chronic constipation, presented from group home with altered mental status, cough. On arrival 11/19 to emergency room core temperature of 88 patient was given warm fluids then chest x-ray revealed bilateral infiltrates suggestive of pneumonia, glucose was 60 patient received 2-1/2 L normal saline for presumed aspiration pneumonia. Pt had recurrent aspiration and had to go to the ICU on 11/23, however has improved but currently still on AB.   Assessment & Plan:   Principal Problem:   Aspiration pneumonitis ([place]) Active Problems:   Schizoaffective disorder, depressive type ([place])   Failure to thrive in adult   Hyperthyroidism   Sepsis secondary to UTI ([place])   Sepsis ([place])   Aspiration pneumonia ([place])  Hyperkalemia; secondary to amiloride.  Interventricular delay, on EKG.  Insulin, amp D 50, calcium gluconate.  Kayexalate.  IV fluids with bicarb.  Repeat B-met this afternoon.   A flutter; on Cardizem.  Cardiology on board: Continue PO diltiazem, not a candidate for [facility]  Hypernatremia; nephrogenic diabetes insipidus.  Improved after stopping Lithium and fluids. Also started on amiloride, which was stopped due to hyperkalemia.  nephrology following.   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 Start IV fluids.   Recurrent aspiration PNA;  Dysphagia 2 diet.  Treated for 10 days of antibiotics.  Finished Augmentin 12-04.  Solumedrol stopped.   Hypoglycemia;  Insulin level normal. Pro-insulin 40 C peptide mildly elevated. At 5.  Suspect hypoglycemia today due to insulin. Monitor.   Acute hypoxic respiratory failure; rescondary to PNA ; resolved   Affective disorder, MR  reported Risperidone, temazepam, ativan.  Lithium discontinue due to Hypernatremia and Diabetes nephrogenic insipidus.   Chronic constipation; on miralax.   Acute on Chronic Thrombocytopenia; follow up out patient. Resolved. Might be related to medication.     DVT prophylaxis: heparin  Code Status: full code.  Family Communication: care discussed with patient.  Disposition Plan: back to group home when stable.   Consultants:   Nephrology    Procedures:   \none   Antimicrobials:none   Subjective: denies abdominal pain/  He wants to go home   Objective: Vitals:   04/11/17 1141 04/11/17 1336 04/11/17 2029 04/12/17 0530  BP: 107/63 112/66 110/60 92/62  Pulse: 82 89 95 97  Resp:   18 18  Temp:   98 F (36.7 C) 98.8 F (37.1 C)  TempSrc:   Oral Oral  SpO2: 100% 96% 98% 98%  Weight:    63.1 kg (139 lb 3.2 oz)  Height:        Intake/Output Summary (Last 24 hours) at 04/12/2017 0932 Last data filed at 04/12/2017 0537 Gross per 24 hour  Intake 1080 ml  Output 2250 ml  Net -1170 ml   Filed Weights   04/08/17 0543 04/11/17 0603 04/12/17 0530  Weight: 76.8 kg (169 lb 5 oz) 65.5 kg (144 lb 6.4 oz) 63.1 kg (139 lb 3.2 oz)    Examination:  General exam: Appears calm and comfortable  Respiratory system: Clear to auscultation. Respiratory effort normal. Cardiovascular system: S1 & S2 heard, RRR. No JVD, murmurs, rubs, gallops or clicks. No pedal edema. Gastrointestinal system: Abdomen is distended, soft and nontender. No organomegaly or masses felt. Normal bowel sounds heard. Central nervous system:  Alert and oriented. No focal neurological deficits. Extremities: Symmetric 5 x 5 power.    Data Reviewed: I have personally reviewed following labs and imaging studies  CBC: Recent Labs  Lab 04/08/17 0409 04/09/17 0352 04/10/17 0611 04/11/17 0552 04/12/17 0519  WBC 15.3* 13.9* 15.0* 14.6* 12.1*  NEUTROABS 9.6* 9.0* 9.2* 9.3* 7.9*  HGB 10.0* 11.5* 12.2* 13.0  14.0  HCT 32.6* 37.8* 38.2* 40.2 42.5  MCV 94.2 94.5 92.3 91.0 90.8  PLT 131* 187 218 272 431   Basic Metabolic Panel: Recent Labs  Lab 04/06/17 1604  04/10/17 0611 04/10/17 1522 04/11/17 0552 04/12/17 0519 04/12/17 0821  NA 151*   < > 134* 132* 131* 132* 131*  K 3.4*   < > 4.5 4.9 5.0 6.1* 6.2*  CL 118*   < > 101 98* 97* 101 94*  CO2 28   < > 26 26 24  21* 25  GLUCOSE 128*   < > 88 92 88 91 128*  BUN 10   < > 13 13 16  21* 22*  CREATININE 1.18   < > 1.04 1.21 1.15 1.12 1.44*  CALCIUM 9.2   < > 9.2 9.6 9.4 9.9 10.2  PHOS 3.2  --   --  4.2  --   --   --    < > = values in this interval not displayed.   GFR: Estimated Creatinine Clearance: 59 mL/min (A) (by C-G formula based on SCr of 1.44 mg/dL (H)). Liver Function Tests: Recent Labs  Lab 04/06/17 1604 04/10/17 1522  ALBUMIN 2.6* 2.8*   No results for input(s): LIPASE, AMYLASE in the last 168 hours. No results for input(s): AMMONIA in the last 168 hours. Coagulation Profile: No results for input(s): INR, PROTIME in the last 168 hours. Cardiac Enzymes: No results for input(s): CKTOTAL, CKMB, CKMBINDEX, TROPONINI in the last 168 hours. BNP (last 3 results) No results for input(s): PROBNP in the last 8760 hours. HbA1C: No results for input(s): HGBA1C in the last 72 hours. CBG: Recent Labs  Lab 04/11/17 0728 04/11/17 1140 04/11/17 1618 04/11/17 2129 04/12/17 0739  GLUCAP 99 102* 88 114* 89   Lipid Profile: No results for input(s): CHOL, HDL, LDLCALC, TRIG, CHOLHDL, LDLDIRECT in the last 72 hours. Thyroid Function Tests: No results for input(s): TSH, T4TOTAL, FREET4, T3FREE, THYROIDAB in the last 72 hours. Anemia Panel: No results for input(s): VITAMINB12, FOLATE, FERRITIN, TIBC, IRON, RETICCTPCT in the last 72 hours. Sepsis Labs: No results for input(s): PROCALCITON, LATICACIDVEN in the last 168 hours.  No results found for this or any previous visit (from the past 240 hour(s)).       Radiology  Studies: No results found.      Scheduled Meds: . benzonatate  100 mg Oral Q8H  . dextrose  1 ampule Intravenous Once  . diltiazem  60 mg Oral Q8H  . heparin  5,000 Units Subcutaneous Q8H  . insulin aspart  10 Units Intravenous Once  . LORazepam  2 mg Oral BID  . montelukast  10 mg Oral QHS  . risperiDONE microspheres  50 mg Intramuscular Q14 Days  . temazepam  30 mg Oral QHS  . Valproate Sodium  250 mg Oral Q6H   Continuous Infusions: . calcium gluconate 1 GM IV    .  sodium bicarbonate  infusion 1000 mL       LOS: 16 days    Time spent: 35 minutes.     Elmarie Shiley, MD [facility] Pager [phone]  If  7PM-7AM, please contact night-coverage [email or web address] Password [email or web address] 04/12/2017, 9:32 AM

## 2017-04-12 NOTE — Plan of Care (Signed)
  Clinical Measurements: Will remain free from infection 04/12/2017 0102 - Progressing by Lilia Argue, RN   Clinical Measurements: Cardiovascular complication will be avoided 04/12/2017 0102 - Progressing by Lakelynn Severtson, Roma Kayser, RN

## 2017-04-12 NOTE — Progress Notes (Signed)
Pt is incontinent and continually removes condom cath.  Therefore, unable to obtain accurate output.

## 2017-04-12 NOTE — Progress Notes (Signed)
Physical Therapy Treatment Patient Details Name: Shane Gentry MRN: [id number] DOB: January 14, 1976 Today's Date: 04/12/2017    History of Present Illness This 41 y.o. male admitted from his group home with AMS. Pt found to have sepsis due to UTI.   X-ray showed PNA, likely due to aspiration.  PMH includes:  MR + schizoaffective disorder, h/o SBO, chronic kidney disease    PT Comments    Pt asleep in bed. Pt wet bed while asleep. Pt able to get up to EOB with supervision for safety. Ambulated to bathroom with no LOB; used counter to hold onto. Pt seated in bathroom trying to pull off soaked gowns over IV and monitor. Got pt calmed down and assisted with pericare. Pt eager to ambulate and was proud he was remembering to pick his feet up when walking. Pt was slightly unsteady while walking but, improved when he did not shuffle. Pt would benefit from skilled PT to increase endurance and functional mobility.  Follow Up Recommendations  Supervision/Assistance - 24 hour;SNF     Equipment Recommendations  Wheelchair (measurements PT)    Recommendations for Other Services       Precautions / Restrictions Precautions Precautions: Fall Precaution Comments: Posterior LOB frequently Restrictions Weight Bearing Restrictions: No    Mobility  Bed Mobility Overal bed mobility: Needs Assistance Bed Mobility: Supine to Sit;Sit to Supine     Supine to sit: Supervision Sit to supine: Supervision   General bed mobility comments: VCs for sequencing and safety.  Transfers Overall transfer level: Needs assistance Equipment used: None Transfers: Sit to/from Stand Sit to Stand: Min guard         General transfer comment: Min guard for safety when standing. Min assist while getting to and from comode and during pericare.  Ambulation/Gait Ambulation/Gait assistance: Min assist Ambulation Distance (Feet): 480 Feet Assistive device: None Gait Pattern/deviations: Step-through pattern;Decreased  stride length;Wide base of support;Shuffle;Decreased dorsiflexion - right;Festinating     General Gait Details: Pt impulsive and easily distractable. Pt remembering to pick feet up as walking; when he remembers to do this balance is good. VCs for posture and step length. No LOB.   Stairs            Wheelchair Mobility    Modified Rankin (Stroke Patients Only)       Balance Overall balance assessment: Needs assistance Sitting-balance support: Feet supported;No upper extremity supported Sitting balance-Leahy Scale: Fair Sitting balance - Comments: static balance okay   Standing balance support: No upper extremity supported Standing balance-Leahy Scale: Fair Standing balance comment: Able to statically stand during pericare with no LOB.                            Cognition Arousal/Alertness: Awake/alert Behavior During Therapy: WFL for tasks assessed/performed Overall Cognitive Status: No family/caregiver present to determine baseline cognitive functioning                                 General Comments: Able to follow one-step commands. Mickie responds well when he knows what the plan will be step-by-step.       Exercises      General Comments        Pertinent Vitals/Pain Pain Assessment: No/denies pain    Home Living                      Prior Function  PT Goals (current goals can now be found in the care plan section) Acute Rehab PT Goals Patient Stated Goal: None stated PT Goal Formulation: Patient unable to participate in goal setting Time For Goal Achievement: 04/13/17 Potential to Achieve Goals: Good Progress towards PT goals: Progressing toward goals    Frequency    Min 3X/week      PT Plan Current plan remains appropriate    Co-evaluation              AM-PAC PT "6 Clicks" Daily Activity  Outcome Measure  Difficulty turning over in bed (including adjusting bedclothes, sheets and  blankets)?: A Little Difficulty moving from lying on back to sitting on the side of the bed? : A Little Difficulty sitting down on and standing up from a chair with arms (e.g., wheelchair, bedside commode, etc,.)?: Unable Help needed moving to and from a bed to chair (including a wheelchair)?: A Little Help needed walking in hospital room?: A Little Help needed climbing 3-5 steps with a railing? : A Lot 6 Click Score: 15    End of Session Equipment Utilized During Treatment: Gait belt Activity Tolerance: Patient tolerated treatment well Patient left: in chair;with call bell/phone within reach;with chair alarm set;with nursing/sitter in room Nurse Communication: Mobility status PT Visit Diagnosis: Other symptoms and signs involving the nervous system (R29.898);Other abnormalities of gait and mobility (R26.89);Unsteadiness on feet (R26.81)     Time: 7262-0355 PT Time Calculation (min) (ACUTE ONLY): 32 min  Charges:  $Gait Training: 8-22 mins $Therapeutic Activity: 8-22 mins                    G Codes:       Janna Arch, SPTA   Janna Arch 04/12/2017, 5:03 PM

## 2017-04-12 NOTE — Progress Notes (Signed)
Dr. Tyrell Antonio has ask this nurse to notify Dr. Moshe Cipro that pt's K+ = 6.1.  AMION text paged this info.

## 2017-04-12 NOTE — Progress Notes (Signed)
Pt has had 2 small BM today, 12.5.18.   At [place], gave another dose of lactulose that pt finished.  However, he only consumed 1/2 of miralax that was also given.

## 2017-04-12 NOTE — Progress Notes (Signed)
SLP Cancellation Note  Patient Details Name: Shane Gentry MRN: [id number] DOB: Jan 05, 1976   Cancelled treatment:        Attempted to see. Pt receiving nursing care. Will continue efforts.    [name] 04/12/2017, 3:24 PM [name] [employer] [phone]

## 2017-04-12 NOTE — Plan of Care (Signed)
  Coping: Level of anxiety will decrease 04/12/2017 0827 - Progressing by Imagene Gurney, RN

## 2017-04-13 DIAGNOSIS — M6281 Muscle weakness (generalized): Secondary | ICD-10-CM | POA: Diagnosis not present

## 2017-04-13 DIAGNOSIS — D518 Other vitamin B12 deficiency anemias: Secondary | ICD-10-CM | POA: Diagnosis not present

## 2017-04-13 DIAGNOSIS — F73 Profound intellectual disabilities: Secondary | ICD-10-CM | POA: Diagnosis not present

## 2017-04-13 DIAGNOSIS — E785 Hyperlipidemia, unspecified: Secondary | ICD-10-CM | POA: Diagnosis not present

## 2017-04-13 DIAGNOSIS — E119 Type 2 diabetes mellitus without complications: Secondary | ICD-10-CM | POA: Diagnosis not present

## 2017-04-13 DIAGNOSIS — J45909 Unspecified asthma, uncomplicated: Secondary | ICD-10-CM | POA: Diagnosis not present

## 2017-04-13 DIAGNOSIS — R2681 Unsteadiness on feet: Secondary | ICD-10-CM | POA: Diagnosis not present

## 2017-04-13 DIAGNOSIS — F259 Schizoaffective disorder, unspecified: Secondary | ICD-10-CM | POA: Diagnosis not present

## 2017-04-13 DIAGNOSIS — J189 Pneumonia, unspecified organism: Secondary | ICD-10-CM | POA: Diagnosis not present

## 2017-04-13 DIAGNOSIS — R1312 Dysphagia, oropharyngeal phase: Secondary | ICD-10-CM | POA: Diagnosis not present

## 2017-04-13 DIAGNOSIS — F338 Other recurrent depressive disorders: Secondary | ICD-10-CM | POA: Diagnosis not present

## 2017-04-13 DIAGNOSIS — R4182 Altered mental status, unspecified: Secondary | ICD-10-CM | POA: Diagnosis not present

## 2017-04-13 DIAGNOSIS — I959 Hypotension, unspecified: Secondary | ICD-10-CM | POA: Diagnosis not present

## 2017-04-13 DIAGNOSIS — R627 Adult failure to thrive: Secondary | ICD-10-CM | POA: Diagnosis not present

## 2017-04-13 DIAGNOSIS — E870 Hyperosmolality and hypernatremia: Secondary | ICD-10-CM | POA: Diagnosis not present

## 2017-04-13 DIAGNOSIS — R6520 Severe sepsis without septic shock: Secondary | ICD-10-CM | POA: Diagnosis not present

## 2017-04-13 DIAGNOSIS — E0590 Thyrotoxicosis, unspecified without thyrotoxic crisis or storm: Secondary | ICD-10-CM | POA: Diagnosis not present

## 2017-04-13 DIAGNOSIS — I10 Essential (primary) hypertension: Secondary | ICD-10-CM | POA: Diagnosis not present

## 2017-04-13 DIAGNOSIS — F909 Attention-deficit hyperactivity disorder, unspecified type: Secondary | ICD-10-CM | POA: Diagnosis not present

## 2017-04-13 DIAGNOSIS — N182 Chronic kidney disease, stage 2 (mild): Secondary | ICD-10-CM | POA: Diagnosis not present

## 2017-04-13 DIAGNOSIS — E7849 Other hyperlipidemia: Secondary | ICD-10-CM | POA: Diagnosis not present

## 2017-04-13 DIAGNOSIS — F419 Anxiety disorder, unspecified: Secondary | ICD-10-CM | POA: Diagnosis not present

## 2017-04-13 DIAGNOSIS — K5909 Other constipation: Secondary | ICD-10-CM | POA: Diagnosis not present

## 2017-04-13 DIAGNOSIS — J690 Pneumonitis due to inhalation of food and vomit: Secondary | ICD-10-CM | POA: Diagnosis not present

## 2017-04-13 DIAGNOSIS — Z79899 Other long term (current) drug therapy: Secondary | ICD-10-CM | POA: Diagnosis not present

## 2017-04-13 DIAGNOSIS — A419 Sepsis, unspecified organism: Secondary | ICD-10-CM | POA: Diagnosis not present

## 2017-04-13 DIAGNOSIS — T68XXXD Hypothermia, subsequent encounter: Secondary | ICD-10-CM | POA: Diagnosis not present

## 2017-04-13 LAB — BASIC METABOLIC PANEL WITH GFR
Anion gap: 10 (ref 5–15)
BUN: 17 mg/dL (ref 6–20)
CO2: 27 mmol/L (ref 22–32)
Calcium: 8.9 mg/dL (ref 8.9–10.3)
Chloride: 95 mmol/L — ABNORMAL LOW (ref 101–111)
Creatinine, Ser: 0.94 mg/dL (ref 0.61–1.24)
GFR calc Af Amer: >60 mL/min
GFR calc non Af Amer: >60 mL/min
Glucose, Bld: 91 mg/dL (ref 65–99)
Potassium: 4.8 mmol/L (ref 3.5–5.1)
Sodium: 132 mmol/L — ABNORMAL LOW (ref 135–145)

## 2017-04-13 LAB — CBC WITH DIFFERENTIAL/PLATELET
BASOS ABS: 0 10*3/uL (ref 0.0–0.1)
Basophils Relative: 0 %
Eosinophils Absolute: 0.1 10*3/uL (ref 0.0–0.7)
Eosinophils Relative: 1 %
HEMATOCRIT: 37.5 % — AB (ref 39.0–52.0)
Hemoglobin: 12.1 g/dL — ABNORMAL LOW (ref 13.0–17.0)
LYMPHS PCT: 26 %
Lymphs Abs: 2.4 10*3/uL (ref 0.7–4.0)
MCH: 29.3 pg (ref 26.0–34.0)
MCHC: 32.3 g/dL (ref 30.0–36.0)
MCV: 90.8 fL (ref 78.0–100.0)
Monocytes Absolute: 1.3 10*3/uL — ABNORMAL HIGH (ref 0.1–1.0)
Monocytes Relative: 15 %
NEUTROS ABS: 5.3 10*3/uL (ref 1.7–7.7)
NEUTROS PCT: 58 %
PLATELETS: 293 10*3/uL (ref 150–400)
RBC: 4.13 MIL/uL — AB (ref 4.22–5.81)
RDW: 15.8 % — ABNORMAL HIGH (ref 11.5–15.5)
WBC: 9.1 10*3/uL (ref 4.0–10.5)

## 2017-04-13 LAB — GLUCOSE, CAPILLARY
GLUCOSE-CAPILLARY: 80 mg/dL (ref 65–99)
Glucose-Capillary: 89 mg/dL (ref 65–99)

## 2017-04-13 MED ORDER — BENZONATATE 100 MG PO CAPS
100.0000 mg | ORAL_CAPSULE | Freq: Three times a day (TID) | ORAL | 0 refills | Status: AC
Start: 2017-04-13 — End: ?

## 2017-04-13 MED ORDER — DILTIAZEM 12 MG/ML ORAL SUSPENSION: 60.0000 mg | Freq: Three times a day (TID) | ORAL | 2 refills | Status: AC

## 2017-04-13 MED ORDER — VALPROATE SODIUM 250 MG/5ML PO SOLN: 250.0000 mg | Freq: Four times a day (QID) | ORAL | 0 refills | Status: AC

## 2017-04-13 NOTE — Progress Notes (Signed)
Subjective:  Apparently constipated- BP soft still- K and renal function normalized with stopping amiloride.  No UOP recorded, sodium still low so does not appear that he has had polyuria Objective Vital signs in last 24 hours: Vitals:   04/12/17 0530 04/12/17 1926 04/13/17 0244 04/13/17 0603  BP: 92/62 94/62  (!) 98/54  Pulse: 97 94  86  Resp: 18 18  18   Temp: 98.8 F (37.1 C) 98.6 F (37 C)  97.8 F (36.6 C)  TempSrc: Oral Oral  Oral  SpO2: 98% 98%  99%  Weight: 63.1 kg (139 lb 3.2 oz)  62.5 kg (137 lb 12.8 oz)   Height:       Weight change: -0.635 kg (-6.4 oz)  Intake/Output Summary (Last 24 hours) at 04/13/2017 9381 Last data filed at 04/12/2017 2300 Gross per 24 hour  Intake 2653 ml  Output -  Net 2653 ml    Assessment/ Plan: Pt is a 41 y.o. yo male schizoaffective D/O in group home who was admitted on 03/27/2017 with altered MS, thought to have PNA- he was on lithium and had nephrogenic DI and hypernatremia  Assessment/Plan: 1. NDI- better with discontinuation of lithium and given water. Amiloride was added.  Sodium if anything low of late, IVF have been stopped, is on ad lib fluids.  Got hyperkalemic  from amiloride, stopped now better.  Hopefully can maintain sodium off of amiloride.  Is actually still lowish so seems that he will be able to - polyuria was slowing down off of lithim 2. Renal - stage 2 CKD- slightly worse maybe due to amiloride and low BP - stop amiloride--resolved 3. Hyperkalemia- given small dose of kaexylate this AM- should resolve once amiloride stopped - resolved 4. Psych- no on depakote.  Would recommend lithium not be restarted  5. HTN/volume- if anything BP is low - stopped amiloride, is also on low dose cardizem  6.PNA- s/p treatment 7. Dispo- I would be comfortable with transfer back to group home.  Would check bmp in about a week review by MD over pt/group home make sure no major abnormalities. Renal will sign off  Ericia Moxley  A    Labs: Basic Metabolic Panel: Recent Labs  Lab 04/06/17 1604  04/10/17 1522  04/12/17 0821 04/12/17 1502 04/13/17 0558  NA 151*   < > 132*   < > 131* 130* 132*  K 3.4*   < > 4.9   < > 6.2* 5.6* 4.8  CL 118*   < > 98*   < > 94* 94* 95*  CO2 28   < > 26   < > 25 21* 27  GLUCOSE 128*   < > 92   < > 128* 81 91  BUN 10   < > 13   < > 22* 23* 17  CREATININE 1.18   < > 1.21   < > 1.44* 1.29* 0.94  CALCIUM 9.2   < > 9.6   < > 10.2 9.2 8.9  PHOS 3.2  --  4.2  --   --   --   --    < > = values in this interval not displayed.   Liver Function Tests: Recent Labs  Lab 04/06/17 1604 04/10/17 1522  ALBUMIN 2.6* 2.8*   No results for input(s): LIPASE, AMYLASE in the last 168 hours. No results for input(s): AMMONIA in the last 168 hours. CBC: Recent Labs  Lab 04/09/17 0352 04/10/17 0611 04/11/17 0552 04/12/17 0519 04/13/17 0558  WBC 13.9*  15.0* 14.6* 12.1* 9.1  NEUTROABS 9.0* 9.2* 9.3* 7.9* 5.3  HGB 11.5* 12.2* 13.0 14.0 12.1*  HCT 37.8* 38.2* 40.2 42.5 37.5*  MCV 94.5 92.3 91.0 90.8 90.8  PLT 187 218 272 281 293   Cardiac Enzymes: No results for input(s): CKTOTAL, CKMB, CKMBINDEX, TROPONINI in the last 168 hours. CBG: Recent Labs  Lab 04/12/17 0739 04/12/17 1138 04/12/17 1605 04/12/17 2105 04/13/17 0742  GLUCAP 89 59* 100* 118* 89    Iron Studies: No results for input(s): IRON, TIBC, TRANSFERRIN, FERRITIN in the last 72 hours. Studies/Results: No results found. Medications: Infusions: .  sodium bicarbonate  infusion 1000 mL 75 mL/hr at 04/13/17 0609    Scheduled Medications: . benzonatate  100 mg Oral Q8H  . diltiazem  60 mg Oral Q8H  . heparin  5,000 Units Subcutaneous Q8H  . LORazepam  2 mg Oral BID  . montelukast  10 mg Oral QHS  . polyethylene glycol  17 g Oral Daily  . risperiDONE microspheres  50 mg Intramuscular Q14 Days  . temazepam  30 mg Oral QHS  . Valproate Sodium  250 mg Oral Q6H    have reviewed scheduled and prn  medications.  Physical Exam: General: alert, asking for grape popsicle Heart: RRR Lungs: mostly clear Abdomen: soft,non tender Extremities: no edema    04/13/2017,8:32 AM  LOS: 17 days

## 2017-04-13 NOTE — Progress Notes (Signed)
Pt d/c to SNF per MD order, report called to receiving RN, pt verbalized understanding, all questions answered

## 2017-04-13 NOTE — Clinical Social Work Placement (Signed)
   CLINICAL SOCIAL WORK PLACEMENT  NOTE  Date:  04/13/2017  Patient Details  Name: Shane Gentry MRN: [id number] Date of Birth: [birth year]  Clinical Social Work is seeking post-discharge placement for this patient at the [place] level of care (*CSW will initial, date and re-position this form in  chart as items are completed):  Yes   Patient/family provided with [place] Work Department's list of facilities offering this level of care within the geographic area requested by the patient (or if unable, by the patient's family).  Yes   Patient/family informed of their freedom to choose among providers that offer the needed level of care, that participate in Medicare, Medicaid or managed care program needed by the patient, have an available bed and are willing to accept the patient.  Yes   Patient/family informed of 's ownership interest in [facility] and [facility], as well as of the fact that they are under no obligation to receive care at these facilities.  PASRR submitted to EDS on 04/07/17     PASRR number received on 04/10/17     Existing PASRR number confirmed on       FL2 transmitted to all facilities in geographic area requested by pt/family on 04/07/17     FL2 transmitted to all facilities within larger geographic area on       Patient informed that his/her managed care company has contracts with or will negotiate with certain facilities, including the following:        Yes   Patient/family informed of bed offers received.  Patient chooses bed at [facility]     Physician recommends and patient chooses bed at      Patient to be transferred to [facility] on 04/13/17.  Patient to be transferred to facility by [facility]     Patient family notified on 04/13/17 of transfer.  Name of family member notified:  Danae Chen Fears: Legal guardian.     PHYSICIAN Please prepare prescriptions     Additional Comment:      _______________________________________________ Candie Chroman, LCSW 04/13/2017, 11:54 AM

## 2017-04-13 NOTE — Discharge Summary (Signed)
Physician Discharge Summary  Knute Mazzuca MCN:[id number] DOB: June 25, 1975 DOA: 03/27/2017  PCP: [name], MD  Admit date: 03/27/2017 Discharge date: 04/13/2017  Admitted From:  Group home  Disposition: group home  Recommendations for Outpatient Follow-up:  1. Follow up with PCP in 1-2 weeks 2. Please obtain BMP/CBC in one week 3. Needs B-met in 1 week to follow sodium level.  4. Please avoid lithium duet Diabetes insipidus nephrogenic.    Discharge Condition: stable.  CODE STATUS: full code.  Diet recommendation: Dysphagia diet, nectar thick   Brief/Interim Summary:  41 year old long-term resident of group homewith hx of prior SBO, HTN, HLD, PAF, failure to thrive, hypothyroidism, schizoaffective disorder + MR, prior dysphagia, severe chronic constipation, presented from group home with altered mental status, cough. On arrival 11/19 to emergency room core temperature of 88 patient was given warm fluids then chest x-ray revealed bilateral infiltrates suggestive of pneumonia, glucose was 60 patient received 2-1/2 L normal saline for presumed aspiration pneumonia. Pt had recurrent aspiration and had to go to the ICU on 11/23, however has improved but currently still on AB.   Assessment & Plan:   Principal Problem:   Aspiration pneumonitis ([place]) Active Problems:   Schizoaffective disorder, depressive type ([place])   Failure to thrive in adult   Hyperthyroidism   Sepsis secondary to UTI ([place])   Sepsis ([place])   Aspiration pneumonia ([place])  Hyperkalemia; secondary to amiloride.  Interventricular delay, on EKG.  Insulin, amp D 50, calcium gluconate.  Kayexalate.  IV fluids with bicarb.  Resolved.   A flutter; on Cardizem.  Cardiology on board: Continue PO diltiazem, not a candidate for [facility]  Hypernatremia; nephrogenic diabetes insipidus. Improved  Improved after stopping Lithium and fluids. Also started on amiloride, which was stopped due to hyperkalemia.  nephrology  following.   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 Start IV fluids.   Recurrent aspiration PNA;  Dysphagia 2 diet.  Treated for 10 days of antibiotics.  Finished Augmentin 12-04.  Solumedrol stopped.   Hypoglycemia;  Insulin level normal. Pro-insulin 40 C peptide mildly elevated. At 5.  Suspect hypoglycemia today due to insulin. Monitor. resolved  Acute hypoxic respiratory failure; rescondary to PNA ; resolved   Affective disorder, MR reported Risperidone, temazepam, ativan.  Lithium discontinue due to Hypernatremia and Diabetes nephrogenic insipidus.   Chronic constipation; on miralax. docusate  Acute on Chronic Thrombocytopenia; follow up out patient. Resolved. Might be related to medication.      Discharge Diagnoses:  Principal Problem:   Aspiration pneumonitis ([place]) Active Problems:   Schizoaffective disorder, depressive type ([place])   Failure to thrive in adult   Hyperthyroidism   Sepsis secondary to UTI ([place])   Sepsis ([place])   Aspiration pneumonia [facility])    Discharge Instructions  Discharge Instructions    Diet - low sodium heart healthy   Complete by:  As directed    Increase activity slowly   Complete by:  As directed      Allergies as of 04/13/2017      Reactions   Sulfa Antibiotics Hives      Medication List    STOP taking these medications   divalproex 500 MG DR tablet Commonly known as:  DEPAKOTE   guaiFENesin-dextromethorphan 100-10 MG/5ML syrup Commonly known as:  ROBITUSSIN DM   lithium 600 MG capsule   metoprolol succinate 25 MG 24 hr tablet Commonly known as:  TOPROL-XL     TAKE these medications   atorvastatin 20 MG tablet Commonly known  as:  LIPITOR Take 1 tablet (20 mg total) by mouth daily at 6 PM.   benzonatate 100 MG capsule Commonly known as:  TESSALON Take 1 capsule (100 mg total) by mouth every 8 (eight) hours.   diltiazem 10 mg/ml oral suspension Commonly known as:  CARDIZEM Take 6 mLs (60 mg total) by mouth  every 8 (eight) hours.   docusate sodium 100 MG capsule Commonly known as:  COLACE Take 1 capsule (100 mg total) by mouth 2 (two) times daily.   LORazepam 2 MG tablet Commonly known as:  ATIVAN Take 2 mg by mouth 2 (two) times daily.   montelukast 10 MG tablet Commonly known as:  SINGULAIR Take 1 tablet (10 mg total) by mouth at bedtime.   omeprazole 40 MG capsule Commonly known as:  PRILOSEC Take 40 mg by mouth daily.   polyethylene glycol packet Commonly known as:  MIRALAX / GLYCOLAX Take 17 g by mouth daily. What changed:  additional instructions   risperiDONE microspheres 50 MG injection Commonly known as:  RISPERDAL CONSTA Inject 2 mLs (50 mg total) into the muscle every 14 (fourteen) days. What changed:  additional instructions   temazepam 30 MG capsule Commonly known as:  RESTORIL Take 1 capsule (30 mg total) by mouth at bedtime.   Valproate Sodium 250 MG/5ML Soln solution Commonly known as:  DEPAKENE Take 5 mLs (250 mg total) by mouth every 6 (six) hours.       Contact information for follow-up providers    [name], MD.   Specialty:  Internal Medicine Contact information: [address] [place] [address] [phone]            Contact information for after-discharge care    [facility] .   Service:  Skilled Nursing Contact information: [address] [address] [phone]                 Allergies  Allergen Reactions  . Sulfa Antibiotics Hives    Consultations:  Nephrology    Procedures/Studies: Dg Chest 2 View  Result Date: 03/27/2017 CLINICAL DATA:  Possible sepsis. EXAM: CHEST  2 VIEW COMPARISON:  11/09/2016. FINDINGS: Trachea is midline. Heart size normal. There is patchy airspace consolidation at the lung bases. No pleural fluid. IMPRESSION: Patchy bibasilar airspace opacification is worrisome for pneumonia or aspiration. Electronically Signed   By: Lorin Picket M.D.   On: 03/27/2017 15:14   Ct Head Wo Contrast  Result Date: 03/24/2017 CLINICAL DATA:  Urinary frequency.  Ataxia. EXAM: CT HEAD WITHOUT CONTRAST TECHNIQUE: Contiguous axial images were obtained from the base of the skull through the vertex without intravenous contrast. COMPARISON:  02/04/2015 FINDINGS: Brain: No evidence of acute infarction, hemorrhage, hydrocephalus, extra-axial collection or mass lesion/mass effect. Generalized cerebral atrophy advanced for age. Mild periventricular white matter low attenuation as can be seen with microvascular disease. Vascular: No hyperdense vessel or unexpected calcification. Skull: No osseous abnormality. Sinuses/Orbits: Visualized paranasal sinuses are clear. Visualized mastoid sinuses are clear. Visualized orbits demonstrate no focal abnormality. Other: None IMPRESSION: 1. No acute intracranial pathology. Electronically Signed   By: Kathreen Devoid   On: 03/24/2017 10:52   Dg Chest Port 1 View  Result Date: 04/03/2017 CLINICAL DATA:  Pneumonia, asthma EXAM: PORTABLE CHEST 1 VIEW COMPARISON:  04/02/2017 FINDINGS: Bilateral perihilar and lower lobe airspace opacities are again noted, not significantly changed since prior study. Heart is mildly enlarged. Possible small left effusion. No  acute bony abnormality. IMPRESSION: No significant change since prior study. Electronically Signed   By: Rolm Baptise M.D.   On: 04/03/2017 10:28   Dg Chest Port 1 View  Result Date: 04/02/2017 CLINICAL DATA:  Pneumonia. EXAM: PORTABLE CHEST 1 VIEW COMPARISON:  Radiograph of March 31, 2017. FINDINGS: Stable cardiomediastinal silhouette. No pneumothorax is noted. Stable bilateral perihilar and basilar opacities are noted concerning for edema or inflammation. Mild left pleural effusion is noted. Bony thorax is unremarkable. IMPRESSION: Stable bilateral perihilar and basilar opacities are noted concerning for pneumonia or edema. Mild left pleural effusion. Electronically  Signed   By: Marijo Conception, M.D.   On: 04/02/2017 07:28   Dg Chest Port 1 View  Result Date: 03/31/2017 CLINICAL DATA:  Worsening sepsis, aspiration and pneumonia. EXAM: PORTABLE CHEST 1 VIEW COMPARISON:  03/27/2017 and prior chest radiographs FINDINGS: Patient is mildly rotated. Right mid and lower lung airspace opacities appear slightly increased. Left lower lung airspace disease/ atelectasis again noted. Cardiomediastinal silhouette is unchanged. No pneumothorax or large pleural effusion identified. IMPRESSION: 1. Right mid and lower lung airspace disease/ pneumonia which appears slightly increased. 2. No significant change in left lower lung airspace disease/atelectasis. Electronically Signed   By: Margarette Canada M.D.   On: 03/31/2017 08:43   Dg Swallowing Func-speech Pathology  Result Date: 04/05/2017 Objective Swallowing Evaluation: Type of Study: MBS-Modified Barium Swallow Study  Patient Details Name: Fitzhugh Vizcarrondo MRN: [id number] Date of Birth: January 02, 1976 Today's Date: 04/05/2017 Time: SLP Start Time (ACUTE ONLY): 9741 -SLP Stop Time (ACUTE ONLY): 0915 SLP Time Calculation (min) (ACUTE ONLY): 12 min Past Medical History: Past Medical History: Diagnosis Date . ADD (attention deficit disorder)  . Anxiety  . Asthma  . Depression  . HLD (hyperlipidemia)  . Prostate cancer ([place])  . Schizoaffective disorder ([place])  . Seasonal allergies  Past Surgical History: Past Surgical History: Procedure Laterality Date . ESOPHAGOGASTRODUODENOSCOPY (EGD) WITH PROPOFOL N/A 04/16/2015  Procedure: ESOPHAGOGASTRODUODENOSCOPY (EGD) WITH PROPOFOL;  Surgeon: Josefine Class, MD;  Location: [facility] ENDOSCOPY;  Service: Endoscopy;  Laterality: N/A; . NO PAST SURGERIES   HPI: 41 year old male living in a group home with a history of ADD, anxiety, asthma, depression, schizoaffective disorder from a group home who presented to the ED with AMS and lethargy. ED visit on 11/16 for evaluation of urinary frequency and being off  balance.CT no acute abnormality and pt discharged. Chest x-ray revealed patchy bibasilar airspace opacification is worrisome for pneumonia. MBS 11/20 recommended Dys 2, thin liquids, aspiration event suspected and transferred to ICU; wet vocal quality and congestion and SLP initiated honey thick liquids then downgraded to nectar. Repeat MBS today assess current swallow function and recommend safest  po's prior to discharge.  No Data Recorded Assessment / Plan / Recommendation CHL IP CLINICAL IMPRESSIONS 04/05/2017 Clinical Impression Pt exhibited similar oropharyngeal swallow ability as prior MBS 11/201/18 although distratcted first half study with need to urinate. As noted prior, when he is internally distracted by pain/discomfort, pt demonstrates oral holding, transit delays, head extention and spastic muscle response due to internal distractions. Study paused; pt attempted to use urinal with subsequent improvements in pt's focus/attention and increase in oral transit time. Pharyngeal phase marked by intermittent swallow initiation at his pyriform sinsuses completely filling before swallow triggered. Min-mild vallecular/pyriform sinsus residue beginning to spill from pyriform sinus before initiating swallow. No significant barium observed in vestibule or trachea during study although pt's oral impairments, cognition and waxing/waning episodes of pain/inattention increase aspiration risk, therefore recommend  pt to remain on nectar thick liquids and Dys 2 texture with Speech Therapy follow up via home health once back at group home.  SLP Visit Diagnosis Dysphagia, oropharyngeal phase (R13.12) Attention and concentration deficit following -- Frontal lobe and executive function deficit following -- Impact on safety and function Moderate aspiration risk   CHL IP TREATMENT RECOMMENDATION 04/05/2017 Treatment Recommendations Therapy as outlined in treatment plan below   Prognosis 04/05/2017 Prognosis for Safe Diet  Advancement (No Data) Barriers to Reach Goals Cognitive deficits Barriers/Prognosis Comment -- CHL IP DIET RECOMMENDATION 04/05/2017 SLP Diet Recommendations Dysphagia 2 (Fine chop) solids;Nectar thick liquid Liquid Administration via Straw;Cup Medication Administration Crushed with puree Compensations Minimize environmental distractions;Slow rate;Small sips/bites;Clear throat intermittently Postural Changes Seated upright at 90 degrees   CHL IP OTHER RECOMMENDATIONS 04/05/2017 Recommended Consults -- Oral Care Recommendations Oral care BID Other Recommendations --   CHL IP FOLLOW UP RECOMMENDATIONS 04/05/2017 Follow up Recommendations 24 hour supervision/assistance   CHL IP FREQUENCY AND DURATION 04/05/2017 Speech Therapy Frequency (ACUTE ONLY) min 2x/week Treatment Duration 2 weeks      CHL IP ORAL PHASE 04/05/2017 Oral Phase Impaired Oral - Pudding Teaspoon -- Oral - Pudding Cup -- Oral - Honey Teaspoon -- Oral - Honey Cup NT Oral - Nectar Teaspoon -- Oral - Nectar Cup NT Oral - Nectar Straw -- Oral - Thin Teaspoon -- Oral - Thin Cup Lingual pumping;Holding of bolus Oral - Thin Straw Lingual pumping;Holding of bolus Oral - Puree Delayed oral transit Oral - Mech Soft -- Oral - Regular Reduced posterior propulsion;Delayed oral transit Oral - Multi-Consistency -- Oral - Pill -- Oral Phase - Comment --  CHL IP PHARYNGEAL PHASE 04/05/2017 Pharyngeal Phase Impaired Pharyngeal- Pudding Teaspoon -- Pharyngeal -- Pharyngeal- Pudding Cup -- Pharyngeal -- Pharyngeal- Honey Teaspoon -- Pharyngeal -- Pharyngeal- Honey Cup NT Pharyngeal -- Pharyngeal- Nectar Teaspoon -- Pharyngeal -- Pharyngeal- Nectar Cup NT Pharyngeal -- Pharyngeal- Nectar Straw NT Pharyngeal -- Pharyngeal- Thin Teaspoon -- Pharyngeal -- Pharyngeal- Thin Cup Delayed swallow initiation-pyriform sinuses Pharyngeal -- Pharyngeal- Thin Straw Delayed swallow initiation-pyriform sinuses;Pharyngeal residue - valleculae;Pharyngeal residue -  pyriform;Penetration/Apiration after swallow Pharyngeal Material enters airway, remains ABOVE vocal cords then ejected out Pharyngeal- Puree WFL Pharyngeal -- Pharyngeal- Mechanical Soft -- Pharyngeal -- Pharyngeal- Regular Pharyngeal residue - valleculae;Reduced epiglottic inversion Pharyngeal -- Pharyngeal- Multi-consistency -- Pharyngeal -- Pharyngeal- Pill -- Pharyngeal -- Pharyngeal Comment --  CHL IP CERVICAL ESOPHAGEAL PHASE 04/05/2017 Cervical Esophageal Phase WFL Pudding Teaspoon -- Pudding Cup -- Honey Teaspoon -- Honey Cup -- Nectar Teaspoon -- Nectar Cup -- Nectar Straw -- Thin Teaspoon -- Thin Cup -- Thin Straw -- Puree -- Mechanical Soft -- Regular -- Multi-consistency -- Pill -- Cervical Esophageal Comment -- No flowsheet data found. [name] 04/05/2017, 10:17 AM [name] CCC-SLP Pager [phone]              Dg Swallowing Func-speech Pathology  Result Date: 03/28/2017 Objective Swallowing Evaluation: Type of Study: MBS-Modified Barium Swallow Study  Patient Details Name: Lawarence Meek MRN: [id number] Date of Birth: 1975/08/06 Today's Date: 03/28/2017 Time: SLP Start Time (ACUTE ONLY): 1937 -SLP Stop Time (ACUTE ONLY): 1358 SLP Time Calculation (min) (ACUTE ONLY): 17 min Past Medical History: Past Medical History: Diagnosis Date . ADD (attention deficit disorder)  . Anxiety  . Asthma  . Depression  . HLD (hyperlipidemia)  . Prostate cancer ([place])  . Schizoaffective disorder ([place])  . Seasonal allergies  Past Surgical History: Past Surgical History: Procedure Laterality Date .  ESOPHAGOGASTRODUODENOSCOPY (EGD) WITH PROPOFOL N/A 04/16/2015  Procedure: ESOPHAGOGASTRODUODENOSCOPY (EGD) WITH PROPOFOL;  Surgeon: Josefine Class, MD;  Location[facility]COPY;  Service: Endoscopy;  Laterality: N/A; . NO PAST SURGERIES   HPI: 41 year old male living in a group home with a history of ADD, anxiety, asthma, depression, schizoaffective disorder from a group home who presented to  the ED with AMS and lethargy. ED visit on 11/16 for evaluation of urinary frequency and being off balance.CT no acute abnormality and pt discharged. Chest x-ray revealed patchy bibasilar airspace opacification is worrisome for pneumonia.  No Data Recorded Assessment / Plan / Recommendation CHL IP CLINICAL IMPRESSIONS 03/28/2017 Clinical Impression Pt demonstrated moderate oral dysphagia marked by decreased labial seal and closure (during mastication) resulting in prolonged mastication, manipulation and delayed transit and labial residue. Pharyngeal phase was overall functional with min-mild vallecular and pyriform sinus residue. Trace amount of barium on anterior wall of laryngeal vestibule noted at end of study. Recommend Dys 2 texture, thin liquids, straws allowed, check oral cavity for potential pocketing and meds whole in puree.   SLP Visit Diagnosis Dysphagia, oral phase (R13.11) Attention and concentration deficit following -- Frontal lobe and executive function deficit following -- Impact on safety and function (No Data)   CHL IP TREATMENT RECOMMENDATION 03/28/2017 Treatment Recommendations Therapy as outlined in treatment plan below   Prognosis 03/28/2017 Prognosis for Safe Diet Advancement Good Barriers to Reach Goals Cognitive deficits Barriers/Prognosis Comment -- CHL IP DIET RECOMMENDATION 03/28/2017 SLP Diet Recommendations Dysphagia 2 (Fine chop) solids;Thin liquid Liquid Administration via Straw;Cup Medication Administration Crushed with puree Compensations Slow rate;Small sips/bites;Lingual sweep for clearance of pocketing;Minimize environmental distractions Postural Changes Seated upright at 90 degrees   CHL IP OTHER RECOMMENDATIONS 03/28/2017 Recommended Consults -- Oral Care Recommendations Oral care BID Other Recommendations --   CHL IP FOLLOW UP RECOMMENDATIONS 03/28/2017 Follow up Recommendations 24 hour supervision/assistance   CHL IP FREQUENCY AND DURATION 03/28/2017 Speech Therapy Frequency  (ACUTE ONLY) min 2x/week Treatment Duration 2 weeks      CHL IP ORAL PHASE 03/28/2017 Oral Phase Impaired Oral - Pudding Teaspoon -- Oral - Pudding Cup -- Oral - Honey Teaspoon -- Oral - Honey Cup Delayed oral transit Oral - Nectar Teaspoon -- Oral - Nectar Cup WFL Oral - Nectar Straw -- Oral - Thin Teaspoon -- Oral - Thin Cup Lingual/palatal residue Oral - Thin Straw NT Oral - Puree -- Oral - Mech Soft -- Oral - Regular Delayed oral transit;Weak lingual manipulation Oral - Multi-Consistency -- Oral - Pill -- Oral Phase - Comment --  CHL IP PHARYNGEAL PHASE 03/28/2017 Pharyngeal Phase Impaired Pharyngeal- Pudding Teaspoon -- Pharyngeal -- Pharyngeal- Pudding Cup -- Pharyngeal -- Pharyngeal- Honey Teaspoon -- Pharyngeal -- Pharyngeal- Honey Cup Pharyngeal residue - valleculae;Pharyngeal residue - pyriform;Reduced epiglottic inversion;Reduced laryngeal elevation Pharyngeal -- Pharyngeal- Nectar Teaspoon -- Pharyngeal -- Pharyngeal- Nectar Cup WFL Pharyngeal -- Pharyngeal- Nectar Straw WFL Pharyngeal -- Pharyngeal- Thin Teaspoon -- Pharyngeal -- Pharyngeal- Thin Cup Pharyngeal residue - valleculae;Pharyngeal residue - pyriform;Penetration/Aspiration during swallow Pharyngeal Material enters airway, remains ABOVE vocal cords and not ejected out Pharyngeal- Thin Straw Pharyngeal residue - valleculae;Pharyngeal residue - pyriform Pharyngeal -- Pharyngeal- Puree -- Pharyngeal -- Pharyngeal- Mechanical Soft -- Pharyngeal -- Pharyngeal- Regular WFL Pharyngeal -- Pharyngeal- Multi-consistency -- Pharyngeal -- Pharyngeal- Pill -- Pharyngeal -- Pharyngeal Comment --  CHL IP CERVICAL ESOPHAGEAL PHASE 03/28/2017 Cervical Esophageal Phase WFL Pudding Teaspoon -- Pudding Cup -- Honey Teaspoon -- Honey Cup -- Nectar Teaspoon -- Nectar Cup -- [employer]  Straw -- Thin Teaspoon -- Thin Cup -- Thin Straw -- Puree -- Mechanical Soft -- Regular -- Multi-consistency -- Pill -- Cervical Esophageal Comment -- No flowsheet data found. [name] 03/28/2017, 4:57 PM [name] CCC-SLP Pager [phone]                  Subjective: He is alert, anxious to go home   Discharge Exam: Vitals:   04/12/17 1926 04/13/17 0603  BP: 94/62 (!) 98/54  Pulse: 94 86  Resp: 18 18  Temp: 98.6 F (37 C) 97.8 F (36.6 C)  SpO2: 98% 99%   Vitals:   04/12/17 0530 04/12/17 1926 04/13/17 0244 04/13/17 0603  BP: 92/62 94/62  (!) 98/54  Pulse: 97 94  86  Resp: _0 Temp: 98.8 F (37.1 C) 98.6 F (37 C)  97.8 F (36.6 C)  TempSrc: Oral Oral  Oral  SpO2: 98% 98%  99%  Weight: 63.1 kg (139 lb 3.2 oz)  62.5 kg (137 lb 12.8 oz)   Height:        General: Pt is alert, awake, not in acute distress Cardiovascular: RRR, S1/S2 +, no rubs, no gallops Respiratory: CTA bilaterally, no wheezing, no rhonchi Abdominal: Soft, NT, ND, bowel sounds + Extremities: no edema, no cyanosis    The results of significant diagnostics from this hospitalization (including imaging, microbiology, ancillary and laboratory) are listed below for reference.     Microbiology: No results found for this or any previous visit (from the past 240 hour(s)).   Labs: BNP (last 3 results) No results for input(s): BNP in the last 8760 hours. Basic Metabolic Panel: Recent Labs  Lab 04/06/17 1604  04/10/17 1522 04/11/17 0552 04/12/17 0519 04/12/17 0821 04/12/17 1502 04/13/17 0558  NA 151*   < > 132* 131* 132* 131* 130* 132*  K 3.4*   < > 4.9 5.0 6.1* 6.2* 5.6* 4.8  CL 118*   < > 98* 97* 101 94* 94* 95*  CO2 28   < > 26 24 21* 25 21* 27  GLUCOSE 128*   < > 92 88 91 128* 81 91  BUN 10   < > 13 16 21* 22* 23* 17  CREATININE 1.18   < > 1.21 1.15 1.12 1.44* 1.29* 0.94  CALCIUM 9.2   < > 9.6 9.4 9.9 10.2 9.2 8.9  PHOS 3.2  --  4.2  --   --   --   --   --    < > = values in this interval not displayed.   Liver Function Tests: Recent Labs  Lab 04/06/17 1604 04/10/17 1522  ALBUMIN 2.6* 2.8*   No results for input(s): LIPASE, AMYLASE  in the last 168 hours. No results for input(s): AMMONIA in the last 168 hours. CBC: Recent Labs  Lab 04/09/17 0352 04/10/17 0611 04/11/17 0552 04/12/17 0519 04/13/17 0558  WBC 13.9* 15.0* 14.6* 12.1* 9.1  NEUTROABS 9.0* 9.2* 9.3* 7.9* 5.3  HGB 11.5* 12.2* 13.0 14.0 12.1*  HCT 37.8* 38.2* 40.2 42.5 37.5*  MCV 94.5 92.3 91.0 90.8 90.8  PLT 187 218 272 281 293   Cardiac Enzymes: No results for input(s): CKTOTAL, CKMB, CKMBINDEX, TROPONINI in the last 168 hours. BNP: Invalid input(s): POCBNP CBG: Recent Labs  Lab 04/12/17 0739 04/12/17 1138 04/12/17 1605 04/12/17 2105 04/13/17 0742  GLUCAP 89 59* 100* 118* 89   D-Dimer No results for input(s): DDIMER in the last 72 hours. Hgb A1c  No results for input(s): HGBA1C in the last 72 hours. Lipid Profile No results for input(s): CHOL, HDL, LDLCALC, TRIG, CHOLHDL, LDLDIRECT in the last 72 hours. Thyroid function studies No results for input(s): TSH, T4TOTAL, T3FREE, THYROIDAB in the last 72 hours.  Invalid input(s): FREET3 Anemia work up No results for input(s): VITAMINB12, FOLATE, FERRITIN, TIBC, IRON, RETICCTPCT in the last 72 hours. Urinalysis    Component Value Date/Time   COLORURINE YELLOW 03/27/2017 2226   APPEARANCEUR CLEAR 03/27/2017 2226   APPEARANCEUR Clear 02/04/2015 0940   LABSPEC 1.011 03/27/2017 2226   PHURINE 5.0 03/27/2017 2226   GLUCOSEU NEGATIVE 03/27/2017 2226   HGBUR NEGATIVE 03/27/2017 2226   BILIRUBINUR NEGATIVE 03/27/2017 2226   BILIRUBINUR Negative 02/04/2015 0940   KETONESUR NEGATIVE 03/27/2017 2226   PROTEINUR NEGATIVE 03/27/2017 2226   UROBILINOGEN 1.0 02/04/2015 1631   NITRITE NEGATIVE 03/27/2017 2226   LEUKOCYTESUR NEGATIVE 03/27/2017 2226   LEUKOCYTESUR Negative 02/04/2015 0940   Sepsis Labs Invalid input(s): PROCALCITONIN,  WBC,  LACTICIDVEN Microbiology No results found for this or any previous visit (from the past 240 hour(s)).   Time coordinating discharge: Over 30  minutes  SIGNED:   Elmarie Shiley, MD  [facility] 04/13/2017, 11:05 AM Pager   If 7PM-7AM, please contact night-coverage [email or web address] Password [email or web address]

## 2017-04-13 NOTE — Progress Notes (Signed)
  Speech Language Pathology Treatment: Dysphagia  Patient Details Name: Shane Gentry MRN: [id number] DOB: 1976-02-11 Today's Date: 04/13/2017 Time: 0922-0933 SLP Time Calculation (min) (ACUTE ONLY): 11 min  Assessment / Plan / Recommendation Clinical Impression  Pt alert, excited about potential for discharge today. Pt observed with nectar juice with complaints of sore throat. No s/s aspiration and required moderate verbal reminders to take small sips which is difficult for him due to cognitive differences. Am recommending he continue nectar thick at discharge with Speech Therapy home health if able. Swallow status has waxed and waned requiring ICU admit for possible aspiraiton, able to upgrade to thin with overt s/s aspiration and downgraded back to nectar after continued s/s aspiration exhibited. Continue minced food texture.    HPI HPI: 41 year old male living in a group home with a history of ADD, anxiety, asthma, depression, schizoaffective disorder from a group home who presented to the ED with AMS and lethargy. ED visit on 11/16 for evaluation of urinary frequency and being off balance.CT no acute abnormality and pt discharged. Chest x-ray revealed patchy bibasilar airspace opacification is worrisome for pneumonia. MBS 11/20 recommended Dys 2, thin liquids, aspiration event suspected and transferred to ICU; wet vocal quality and congestion and SLP initiated honey thick liquids then downgraded to nectar. Repeat MBS today assess current swallow function and recommend safest  po's prior to discharge.       SLP Plan  Continue with current plan of care       Recommendations  Diet recommendations: Dysphagia 2 (fine chop);Nectar-thick liquid Liquids provided via: Cup Medication Administration: Crushed with puree Supervision: Patient able to self feed;Full supervision/cueing for compensatory strategies Compensations: Minimize environmental distractions;Slow rate;Small sips/bites Postural  Changes and/or Swallow Maneuvers: Seated upright 90 degrees                Oral Care Recommendations: Oral care BID Follow up Recommendations: 24 hour supervision/assistance SLP Visit Diagnosis: Dysphagia, oropharyngeal phase (R13.12) Plan: Continue with current plan of care       GO                [name] 04/13/2017, 9:41 AM   [name] [phone]

## 2017-04-13 NOTE — Clinical Social Work Note (Signed)
CSW facilitated patient discharge including contacting patient's legal guardian and facility to confirm patient discharge plans. Clinical information faxed to facility and family agreeable with plan. CSW arranged ambulance transport via [facility] to [facility]. RN is currently calling report.  CSW will sign off for now as social work intervention is no longer needed. Please consult [place] again if new needs arise.  Shane Gentry, [place]

## 2017-04-14 DIAGNOSIS — F909 Attention-deficit hyperactivity disorder, unspecified type: Secondary | ICD-10-CM | POA: Diagnosis not present

## 2017-04-14 DIAGNOSIS — I10 Essential (primary) hypertension: Secondary | ICD-10-CM | POA: Diagnosis not present

## 2017-04-14 DIAGNOSIS — J45909 Unspecified asthma, uncomplicated: Secondary | ICD-10-CM | POA: Diagnosis not present

## 2017-04-14 DIAGNOSIS — J690 Pneumonitis due to inhalation of food and vomit: Secondary | ICD-10-CM | POA: Diagnosis not present

## 2017-04-17 DIAGNOSIS — F259 Schizoaffective disorder, unspecified: Secondary | ICD-10-CM | POA: Diagnosis not present

## 2017-04-17 DIAGNOSIS — F909 Attention-deficit hyperactivity disorder, unspecified type: Secondary | ICD-10-CM | POA: Diagnosis not present

## 2017-04-17 DIAGNOSIS — I10 Essential (primary) hypertension: Secondary | ICD-10-CM | POA: Diagnosis not present

## 2017-04-17 DIAGNOSIS — J690 Pneumonitis due to inhalation of food and vomit: Secondary | ICD-10-CM | POA: Diagnosis not present

## 2017-04-19 DIAGNOSIS — E7849 Other hyperlipidemia: Secondary | ICD-10-CM | POA: Diagnosis not present

## 2017-04-19 DIAGNOSIS — E119 Type 2 diabetes mellitus without complications: Secondary | ICD-10-CM | POA: Diagnosis not present

## 2017-04-19 DIAGNOSIS — Z79899 Other long term (current) drug therapy: Secondary | ICD-10-CM | POA: Diagnosis not present

## 2017-04-19 DIAGNOSIS — D518 Other vitamin B12 deficiency anemias: Secondary | ICD-10-CM | POA: Diagnosis not present

## 2017-04-20 DIAGNOSIS — F909 Attention-deficit hyperactivity disorder, unspecified type: Secondary | ICD-10-CM | POA: Diagnosis not present

## 2017-04-20 DIAGNOSIS — J45909 Unspecified asthma, uncomplicated: Secondary | ICD-10-CM | POA: Diagnosis not present

## 2017-04-20 DIAGNOSIS — J690 Pneumonitis due to inhalation of food and vomit: Secondary | ICD-10-CM | POA: Diagnosis not present

## 2017-04-20 DIAGNOSIS — J189 Pneumonia, unspecified organism: Secondary | ICD-10-CM | POA: Diagnosis not present

## 2017-04-29 DIAGNOSIS — I10 Essential (primary) hypertension: Secondary | ICD-10-CM | POA: Diagnosis not present

## 2017-04-29 DIAGNOSIS — J690 Pneumonitis due to inhalation of food and vomit: Secondary | ICD-10-CM | POA: Diagnosis not present

## 2017-04-29 DIAGNOSIS — J45909 Unspecified asthma, uncomplicated: Secondary | ICD-10-CM | POA: Diagnosis not present

## 2017-04-29 DIAGNOSIS — F73 Profound intellectual disabilities: Secondary | ICD-10-CM | POA: Diagnosis not present

## 2017-04-29 DIAGNOSIS — F419 Anxiety disorder, unspecified: Secondary | ICD-10-CM | POA: Diagnosis not present

## 2017-04-29 DIAGNOSIS — A419 Sepsis, unspecified organism: Secondary | ICD-10-CM | POA: Diagnosis not present

## 2017-04-29 DIAGNOSIS — F259 Schizoaffective disorder, unspecified: Secondary | ICD-10-CM | POA: Diagnosis not present

## 2017-05-03 DIAGNOSIS — F259 Schizoaffective disorder, unspecified: Secondary | ICD-10-CM | POA: Diagnosis not present

## 2017-05-03 DIAGNOSIS — R7309 Other abnormal glucose: Secondary | ICD-10-CM | POA: Diagnosis not present

## 2017-05-03 DIAGNOSIS — J189 Pneumonia, unspecified organism: Secondary | ICD-10-CM | POA: Diagnosis not present

## 2017-05-03 DIAGNOSIS — E785 Hyperlipidemia, unspecified: Secondary | ICD-10-CM | POA: Diagnosis not present

## 2017-05-03 DIAGNOSIS — Z299 Encounter for prophylactic measures, unspecified: Secondary | ICD-10-CM | POA: Diagnosis not present

## 2017-05-03 DIAGNOSIS — I10 Essential (primary) hypertension: Secondary | ICD-10-CM | POA: Diagnosis not present

## 2017-05-03 DIAGNOSIS — F419 Anxiety disorder, unspecified: Secondary | ICD-10-CM | POA: Diagnosis not present

## 2017-05-03 DIAGNOSIS — Z6825 Body mass index (BMI) 25.0-25.9, adult: Secondary | ICD-10-CM | POA: Diagnosis not present

## 2017-05-03 DIAGNOSIS — J45909 Unspecified asthma, uncomplicated: Secondary | ICD-10-CM | POA: Diagnosis not present

## 2017-05-03 DIAGNOSIS — K219 Gastro-esophageal reflux disease without esophagitis: Secondary | ICD-10-CM | POA: Diagnosis not present

## 2017-05-03 DIAGNOSIS — K5900 Constipation, unspecified: Secondary | ICD-10-CM | POA: Diagnosis not present

## 2017-05-03 DIAGNOSIS — F73 Profound intellectual disabilities: Secondary | ICD-10-CM | POA: Diagnosis not present

## 2017-05-11 DIAGNOSIS — J690 Pneumonitis due to inhalation of food and vomit: Secondary | ICD-10-CM | POA: Diagnosis not present

## 2017-05-11 DIAGNOSIS — F73 Profound intellectual disabilities: Secondary | ICD-10-CM | POA: Diagnosis not present

## 2017-05-11 DIAGNOSIS — F419 Anxiety disorder, unspecified: Secondary | ICD-10-CM | POA: Diagnosis not present

## 2017-05-11 DIAGNOSIS — F259 Schizoaffective disorder, unspecified: Secondary | ICD-10-CM | POA: Diagnosis not present

## 2017-05-11 DIAGNOSIS — A419 Sepsis, unspecified organism: Secondary | ICD-10-CM | POA: Diagnosis not present

## 2017-05-11 DIAGNOSIS — I10 Essential (primary) hypertension: Secondary | ICD-10-CM | POA: Diagnosis not present

## 2017-05-18 DIAGNOSIS — I10 Essential (primary) hypertension: Secondary | ICD-10-CM | POA: Diagnosis not present

## 2017-05-18 DIAGNOSIS — J690 Pneumonitis due to inhalation of food and vomit: Secondary | ICD-10-CM | POA: Diagnosis not present

## 2017-05-18 DIAGNOSIS — F419 Anxiety disorder, unspecified: Secondary | ICD-10-CM | POA: Diagnosis not present

## 2017-05-18 DIAGNOSIS — F73 Profound intellectual disabilities: Secondary | ICD-10-CM | POA: Diagnosis not present

## 2017-05-18 DIAGNOSIS — F259 Schizoaffective disorder, unspecified: Secondary | ICD-10-CM | POA: Diagnosis not present

## 2017-05-18 DIAGNOSIS — A419 Sepsis, unspecified organism: Secondary | ICD-10-CM | POA: Diagnosis not present

## 2017-05-19 IMAGING — CT CT HEAD W/O CM
2 series · 16 of 30 positions shown, 20 images · non-contrast
Comparison: None available.

CLINICAL DATA: Mental status changes today. Shaking uncontrollably.
Increased confusion.

EXAM:
CT HEAD WITHOUT CONTRAST
TECHNIQUE: Contiguous axial images were obtained from the base of the skull
through the vertex without intravenous contrast.

[Series 2: head w/o · axial · non-contrast · 0.42mm/px · z∈[-164,-24]mm · 13 of 34 slices shown, 17 images]
[im 3/34  brain]
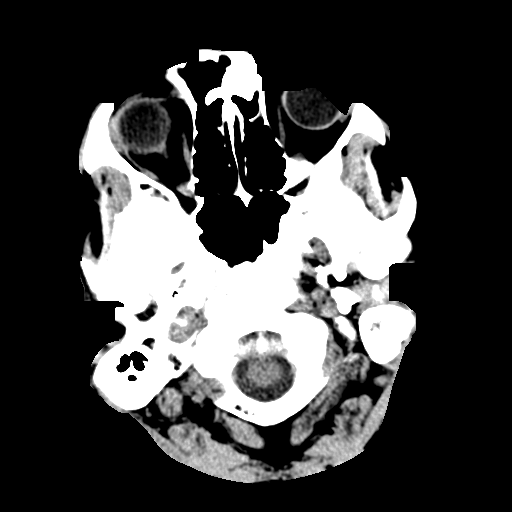
[im 3/34  bone]
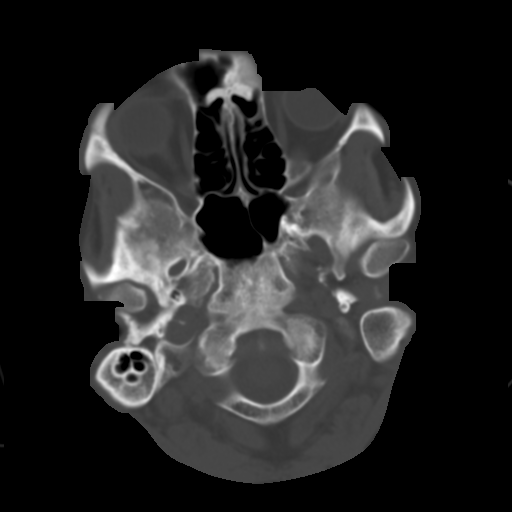
[im 5/34  brain]
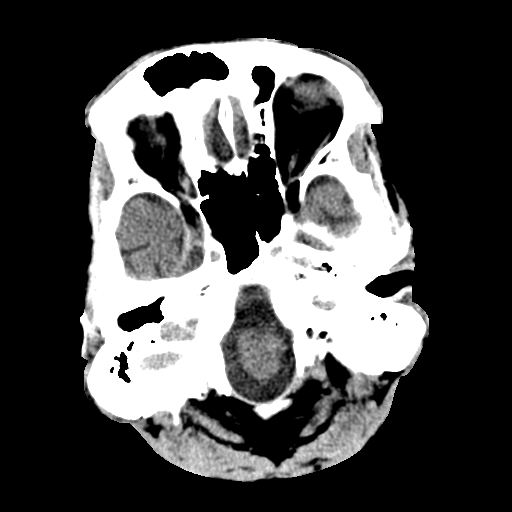
[im 8/34  brain]
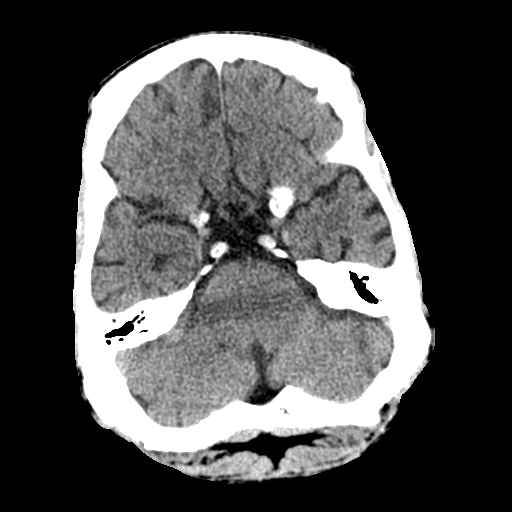
[im 10/34  brain]
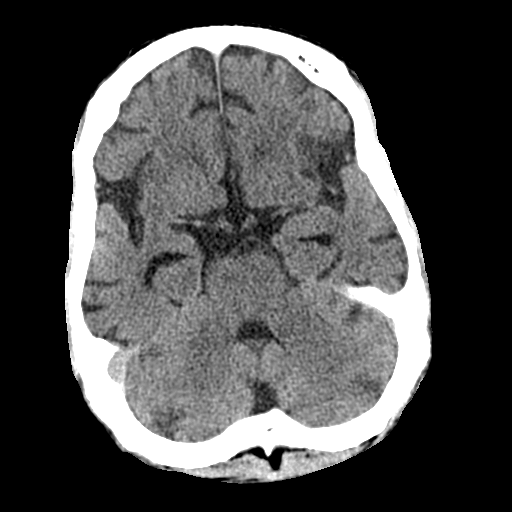
[im 12/34  brain]
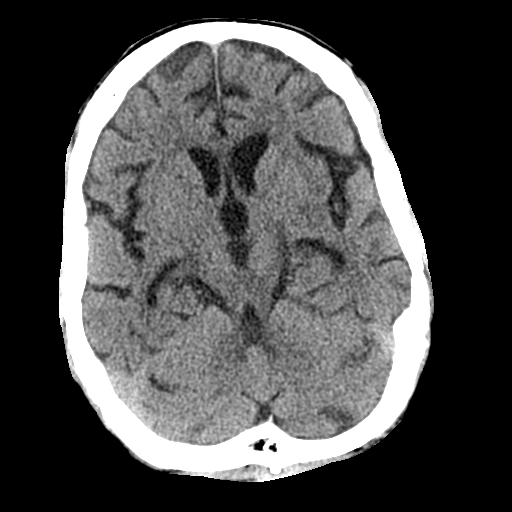
[im 12/34  bone]
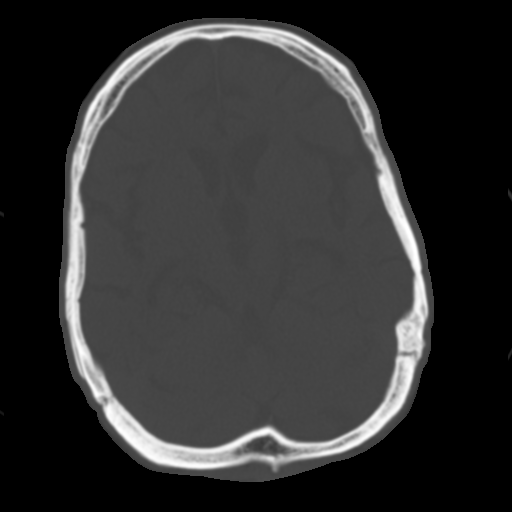
[im 15/34  brain]
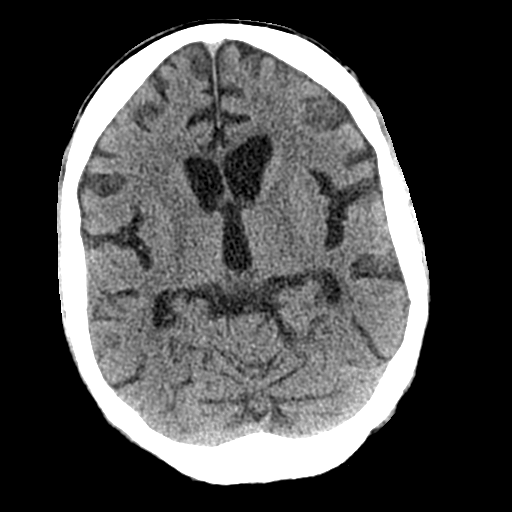
[im 17/34  brain]
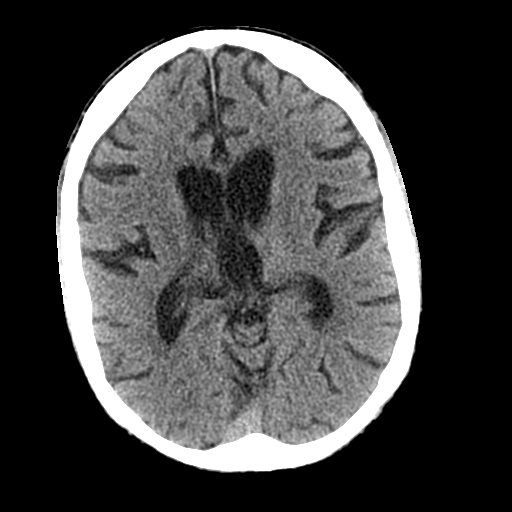
[im 19/34  brain]
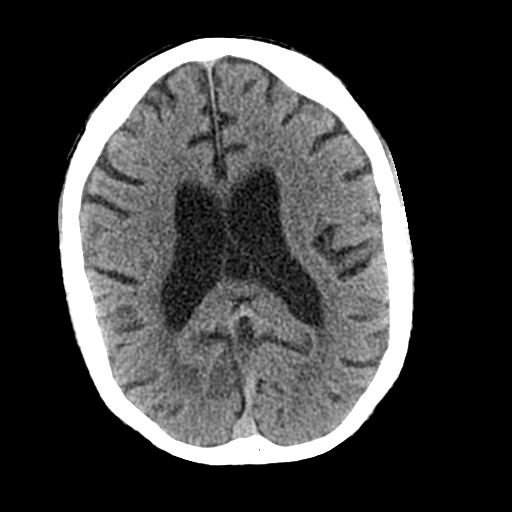
[im 22/34  brain]
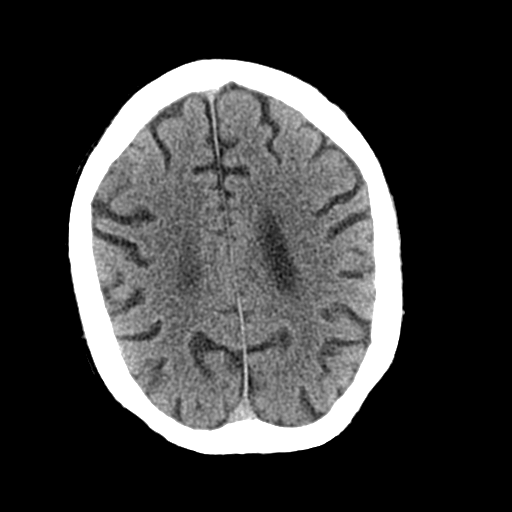
[im 22/34  bone]
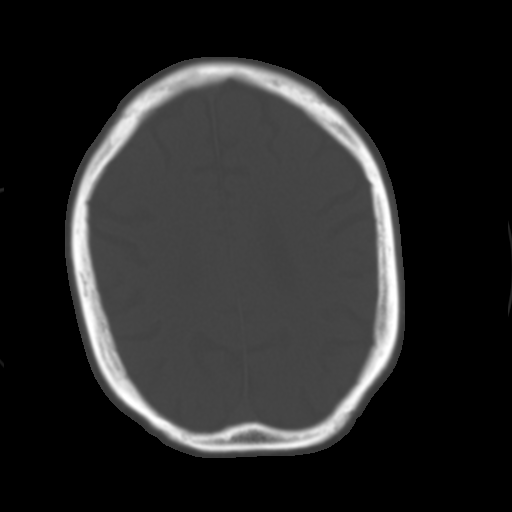
[im 24/34  brain]
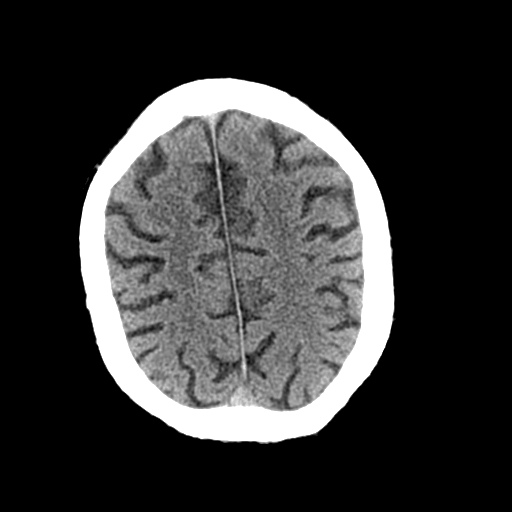
[im 26/34  brain]
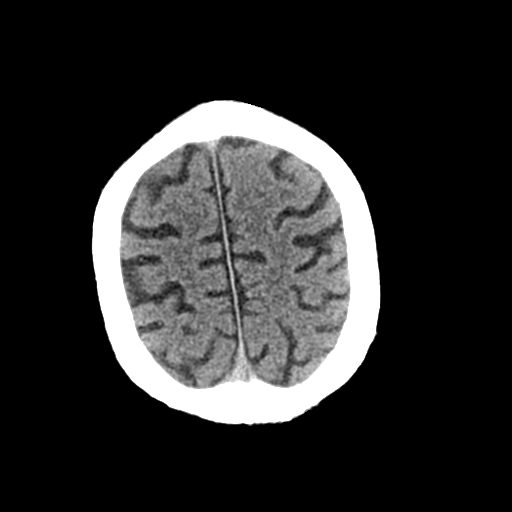
[im 29/34  brain]
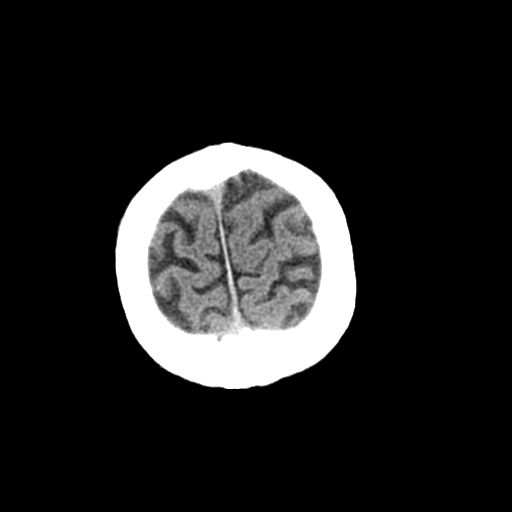
[im 31/34  brain]
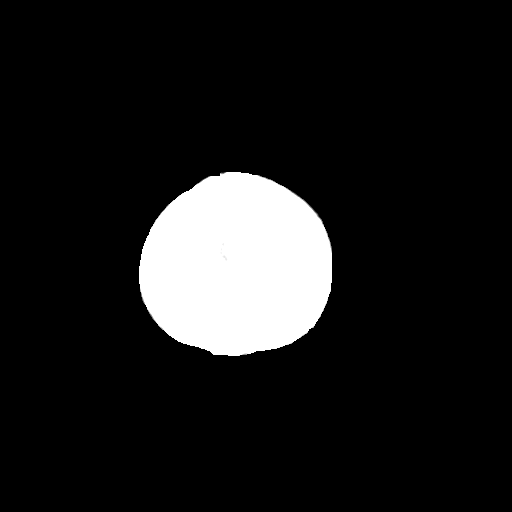
[im 31/34  bone]
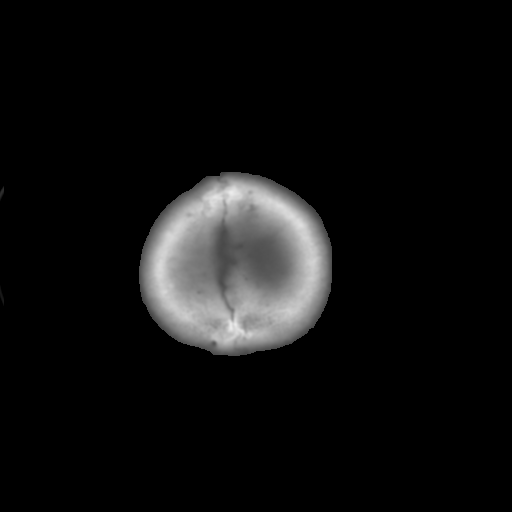

[Series 3: bone windows · axial · 0.42mm/px · z∈[-164,-119]mm · 3 of 34 slices shown]
[im 3/34  bone]
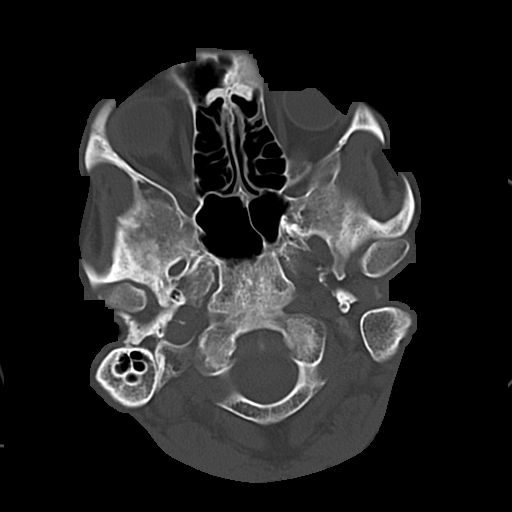
[im 8/34  bone]
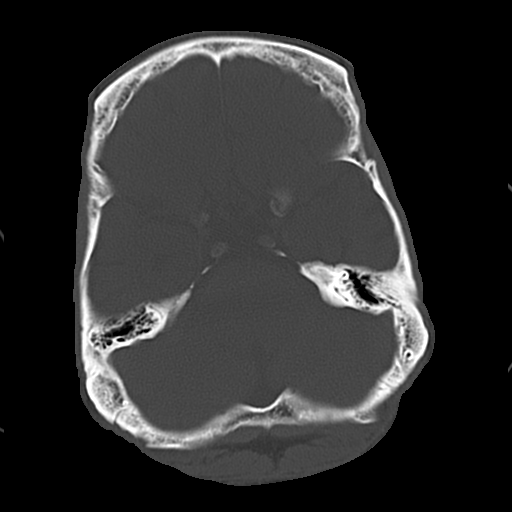
[im 12/34  bone]
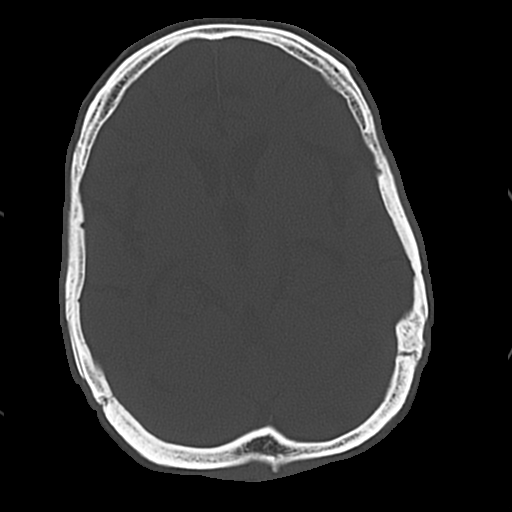

[16 of 30 positions shown; findings below may reference images not displayed]

FINDINGS: Age advanced cerebral atrophy and ventriculomegaly. No acute
intracranial findings such as hemispheric infarction an or
intracranial hemorrhage. No mass lesions. The brainstem and
cerebellum are grossly normal. No extra-axial fluid collections are
identified

No acute bony findings. The paranasal sinuses and mastoid air cells
are clear. The globes are intact.
IMPRESSION: Age advanced cerebral atrophy and ventriculomegaly.

No acute intracranial findings or mass lesion.

## 2017-05-23 DIAGNOSIS — H40013 Open angle with borderline findings, low risk, bilateral: Secondary | ICD-10-CM | POA: Diagnosis not present

## 2017-05-25 DIAGNOSIS — F73 Profound intellectual disabilities: Secondary | ICD-10-CM | POA: Diagnosis not present

## 2017-05-25 DIAGNOSIS — J690 Pneumonitis due to inhalation of food and vomit: Secondary | ICD-10-CM | POA: Diagnosis not present

## 2017-05-25 DIAGNOSIS — F259 Schizoaffective disorder, unspecified: Secondary | ICD-10-CM | POA: Diagnosis not present

## 2017-05-25 DIAGNOSIS — F419 Anxiety disorder, unspecified: Secondary | ICD-10-CM | POA: Diagnosis not present

## 2017-05-25 DIAGNOSIS — A419 Sepsis, unspecified organism: Secondary | ICD-10-CM | POA: Diagnosis not present

## 2017-05-25 DIAGNOSIS — I10 Essential (primary) hypertension: Secondary | ICD-10-CM | POA: Diagnosis not present

## 2017-06-02 DIAGNOSIS — F250 Schizoaffective disorder, bipolar type: Secondary | ICD-10-CM | POA: Diagnosis not present

## 2017-06-02 DIAGNOSIS — Z79899 Other long term (current) drug therapy: Secondary | ICD-10-CM | POA: Diagnosis not present
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
## 2017-07-11 DIAGNOSIS — J309 Allergic rhinitis, unspecified: Secondary | ICD-10-CM | POA: Diagnosis not present

## 2017-07-11 DIAGNOSIS — Z7189 Other specified counseling: Secondary | ICD-10-CM | POA: Diagnosis not present

## 2017-07-11 DIAGNOSIS — Z0000 Encounter for general adult medical examination without abnormal findings: Secondary | ICD-10-CM | POA: Diagnosis not present

## 2017-07-11 DIAGNOSIS — J45909 Unspecified asthma, uncomplicated: Secondary | ICD-10-CM | POA: Diagnosis not present

## 2017-07-11 DIAGNOSIS — F259 Schizoaffective disorder, unspecified: Secondary | ICD-10-CM | POA: Diagnosis not present

## 2017-07-11 DIAGNOSIS — F419 Anxiety disorder, unspecified: Secondary | ICD-10-CM | POA: Diagnosis not present

## 2017-07-11 DIAGNOSIS — Z1339 Encounter for screening examination for other mental health and behavioral disorders: Secondary | ICD-10-CM | POA: Diagnosis not present

## 2017-07-11 DIAGNOSIS — Z299 Encounter for prophylactic measures, unspecified: Secondary | ICD-10-CM | POA: Diagnosis not present

## 2017-07-11 DIAGNOSIS — Z6825 Body mass index (BMI) 25.0-25.9, adult: Secondary | ICD-10-CM | POA: Diagnosis not present

## 2017-07-11 DIAGNOSIS — Z1331 Encounter for screening for depression: Secondary | ICD-10-CM | POA: Diagnosis not present

## 2017-07-11 DIAGNOSIS — F909 Attention-deficit hyperactivity disorder, unspecified type: Secondary | ICD-10-CM | POA: Diagnosis not present

## 2017-07-11 DIAGNOSIS — I10 Essential (primary) hypertension: Secondary | ICD-10-CM | POA: Diagnosis not present

## 2017-07-13 DIAGNOSIS — I10 Essential (primary) hypertension: Secondary | ICD-10-CM | POA: Diagnosis not present

## 2017-07-13 DIAGNOSIS — Z79899 Other long term (current) drug therapy: Secondary | ICD-10-CM | POA: Diagnosis not present

## 2017-07-13 DIAGNOSIS — F419 Anxiety disorder, unspecified: Secondary | ICD-10-CM | POA: Diagnosis not present

## 2017-08-22 DIAGNOSIS — Z79899 Other long term (current) drug therapy: Secondary | ICD-10-CM | POA: Diagnosis not present

## 2017-08-22 DIAGNOSIS — F250 Schizoaffective disorder, bipolar type: Secondary | ICD-10-CM | POA: Diagnosis not present

## 2017-10-13 DIAGNOSIS — Z6825 Body mass index (BMI) 25.0-25.9, adult: Secondary | ICD-10-CM | POA: Diagnosis not present

## 2017-10-13 DIAGNOSIS — I10 Essential (primary) hypertension: Secondary | ICD-10-CM | POA: Diagnosis not present

## 2017-10-13 DIAGNOSIS — K219 Gastro-esophageal reflux disease without esophagitis: Secondary | ICD-10-CM | POA: Diagnosis not present

## 2017-10-13 DIAGNOSIS — Z299 Encounter for prophylactic measures, unspecified: Secondary | ICD-10-CM | POA: Diagnosis not present

## 2017-10-13 DIAGNOSIS — F909 Attention-deficit hyperactivity disorder, unspecified type: Secondary | ICD-10-CM | POA: Diagnosis not present

## 2017-10-13 DIAGNOSIS — J45909 Unspecified asthma, uncomplicated: Secondary | ICD-10-CM | POA: Diagnosis not present

## 2017-10-13 DIAGNOSIS — F419 Anxiety disorder, unspecified: Secondary | ICD-10-CM | POA: Diagnosis not present

## 2017-10-13 DIAGNOSIS — F259 Schizoaffective disorder, unspecified: Secondary | ICD-10-CM | POA: Diagnosis not present

## 2017-10-13 DIAGNOSIS — E785 Hyperlipidemia, unspecified: Secondary | ICD-10-CM | POA: Diagnosis not present

## 2017-11-20 DIAGNOSIS — F200 Paranoid schizophrenia: Secondary | ICD-10-CM | POA: Diagnosis not present

## 2017-11-20 DIAGNOSIS — Z79899 Other long term (current) drug therapy: Secondary | ICD-10-CM | POA: Diagnosis not present

## 2018-01-19 DIAGNOSIS — Z299 Encounter for prophylactic measures, unspecified: Secondary | ICD-10-CM | POA: Diagnosis not present

## 2018-01-19 DIAGNOSIS — Z6826 Body mass index (BMI) 26.0-26.9, adult: Secondary | ICD-10-CM | POA: Diagnosis not present

## 2018-01-19 DIAGNOSIS — I10 Essential (primary) hypertension: Secondary | ICD-10-CM | POA: Diagnosis not present

## 2018-01-19 DIAGNOSIS — E785 Hyperlipidemia, unspecified: Secondary | ICD-10-CM | POA: Diagnosis not present

## 2018-01-19 DIAGNOSIS — F259 Schizoaffective disorder, unspecified: Secondary | ICD-10-CM | POA: Diagnosis not present

## 2018-02-09 DIAGNOSIS — Z6826 Body mass index (BMI) 26.0-26.9, adult: Secondary | ICD-10-CM | POA: Diagnosis not present

## 2018-02-09 DIAGNOSIS — F259 Schizoaffective disorder, unspecified: Secondary | ICD-10-CM | POA: Diagnosis not present

## 2018-02-09 DIAGNOSIS — J209 Acute bronchitis, unspecified: Secondary | ICD-10-CM | POA: Diagnosis not present

## 2018-02-09 DIAGNOSIS — Z299 Encounter for prophylactic measures, unspecified: Secondary | ICD-10-CM | POA: Diagnosis not present

## 2018-02-09 DIAGNOSIS — I10 Essential (primary) hypertension: Secondary | ICD-10-CM | POA: Diagnosis not present

## 2018-02-09 DIAGNOSIS — E785 Hyperlipidemia, unspecified: Secondary | ICD-10-CM | POA: Diagnosis not present

## 2018-02-13 DIAGNOSIS — Z79899 Other long term (current) drug therapy: Secondary | ICD-10-CM | POA: Diagnosis not present

## 2018-02-13 DIAGNOSIS — F200 Paranoid schizophrenia: Secondary | ICD-10-CM | POA: Diagnosis not present

## 2018-04-16 DIAGNOSIS — J069 Acute upper respiratory infection, unspecified: Secondary | ICD-10-CM | POA: Diagnosis not present

## 2018-04-16 DIAGNOSIS — Z299 Encounter for prophylactic measures, unspecified: Secondary | ICD-10-CM | POA: Diagnosis not present

## 2018-04-16 DIAGNOSIS — Z6824 Body mass index (BMI) 24.0-24.9, adult: Secondary | ICD-10-CM | POA: Diagnosis not present

## 2018-04-16 DIAGNOSIS — Z789 Other specified health status: Secondary | ICD-10-CM | POA: Diagnosis not present

## 2018-04-16 DIAGNOSIS — I10 Essential (primary) hypertension: Secondary | ICD-10-CM | POA: Diagnosis not present

## 2018-05-14 DIAGNOSIS — Z79899 Other long term (current) drug therapy: Secondary | ICD-10-CM | POA: Diagnosis not present

## 2018-05-14 DIAGNOSIS — F250 Schizoaffective disorder, bipolar type: Secondary | ICD-10-CM | POA: Diagnosis not present

## 2018-08-10 DIAGNOSIS — F200 Paranoid schizophrenia: Secondary | ICD-10-CM | POA: Diagnosis not present

## 2018-09-17 DIAGNOSIS — Z6826 Body mass index (BMI) 26.0-26.9, adult: Secondary | ICD-10-CM | POA: Diagnosis not present

## 2018-09-17 DIAGNOSIS — Z299 Encounter for prophylactic measures, unspecified: Secondary | ICD-10-CM | POA: Diagnosis not present

## 2018-09-17 DIAGNOSIS — I10 Essential (primary) hypertension: Secondary | ICD-10-CM | POA: Diagnosis not present

## 2018-09-26 DIAGNOSIS — F71 Moderate intellectual disabilities: Secondary | ICD-10-CM | POA: Diagnosis not present

## 2018-09-26 DIAGNOSIS — F6381 Intermittent explosive disorder: Secondary | ICD-10-CM | POA: Diagnosis not present

## 2018-09-26 DIAGNOSIS — F250 Schizoaffective disorder, bipolar type: Secondary | ICD-10-CM | POA: Diagnosis not present

## 2018-09-27 DIAGNOSIS — Z1339 Encounter for screening examination for other mental health and behavioral disorders: Secondary | ICD-10-CM | POA: Diagnosis not present

## 2018-09-27 DIAGNOSIS — Z0000 Encounter for general adult medical examination without abnormal findings: Secondary | ICD-10-CM | POA: Diagnosis not present

## 2018-09-27 DIAGNOSIS — Z7189 Other specified counseling: Secondary | ICD-10-CM | POA: Diagnosis not present

## 2018-09-27 DIAGNOSIS — Z299 Encounter for prophylactic measures, unspecified: Secondary | ICD-10-CM | POA: Diagnosis not present

## 2018-09-27 DIAGNOSIS — Z1331 Encounter for screening for depression: Secondary | ICD-10-CM | POA: Diagnosis not present

## 2018-09-27 DIAGNOSIS — R5383 Other fatigue: Secondary | ICD-10-CM | POA: Diagnosis not present

## 2018-09-27 DIAGNOSIS — E785 Hyperlipidemia, unspecified: Secondary | ICD-10-CM | POA: Diagnosis not present

## 2018-09-27 DIAGNOSIS — Z79899 Other long term (current) drug therapy: Secondary | ICD-10-CM | POA: Diagnosis not present

## 2018-09-27 DIAGNOSIS — I10 Essential (primary) hypertension: Secondary | ICD-10-CM | POA: Diagnosis not present

## 2018-09-27 DIAGNOSIS — Z1211 Encounter for screening for malignant neoplasm of colon: Secondary | ICD-10-CM | POA: Diagnosis not present

## 2018-09-27 DIAGNOSIS — Z6826 Body mass index (BMI) 26.0-26.9, adult: Secondary | ICD-10-CM | POA: Diagnosis not present

## 2018-10-23 DIAGNOSIS — F6381 Intermittent explosive disorder: Secondary | ICD-10-CM | POA: Diagnosis not present

## 2018-10-23 DIAGNOSIS — F250 Schizoaffective disorder, bipolar type: Secondary | ICD-10-CM | POA: Diagnosis not present

## 2018-10-23 DIAGNOSIS — F71 Moderate intellectual disabilities: Secondary | ICD-10-CM | POA: Diagnosis not present

## 2018-12-10 DIAGNOSIS — F259 Schizoaffective disorder, unspecified: Secondary | ICD-10-CM | POA: Diagnosis not present

## 2018-12-10 DIAGNOSIS — Z299 Encounter for prophylactic measures, unspecified: Secondary | ICD-10-CM | POA: Diagnosis not present

## 2018-12-10 DIAGNOSIS — Z6826 Body mass index (BMI) 26.0-26.9, adult: Secondary | ICD-10-CM | POA: Diagnosis not present

## 2018-12-10 DIAGNOSIS — Z713 Dietary counseling and surveillance: Secondary | ICD-10-CM | POA: Diagnosis not present

## 2018-12-10 DIAGNOSIS — I10 Essential (primary) hypertension: Secondary | ICD-10-CM | POA: Diagnosis not present

## 2019-02-19 DIAGNOSIS — F71 Moderate intellectual disabilities: Secondary | ICD-10-CM | POA: Diagnosis not present

## 2019-02-19 DIAGNOSIS — F6381 Intermittent explosive disorder: Secondary | ICD-10-CM | POA: Diagnosis not present

## 2019-02-19 DIAGNOSIS — F250 Schizoaffective disorder, bipolar type: Secondary | ICD-10-CM | POA: Diagnosis not present

## 2019-04-09 DIAGNOSIS — Z713 Dietary counseling and surveillance: Secondary | ICD-10-CM | POA: Diagnosis not present

## 2019-04-09 DIAGNOSIS — R350 Frequency of micturition: Secondary | ICD-10-CM | POA: Diagnosis not present

## 2019-04-09 DIAGNOSIS — Z299 Encounter for prophylactic measures, unspecified: Secondary | ICD-10-CM | POA: Diagnosis not present

## 2019-04-09 DIAGNOSIS — Z6826 Body mass index (BMI) 26.0-26.9, adult: Secondary | ICD-10-CM | POA: Diagnosis not present

## 2019-04-09 DIAGNOSIS — I10 Essential (primary) hypertension: Secondary | ICD-10-CM | POA: Diagnosis not present

## 2019-04-17 DIAGNOSIS — F71 Moderate intellectual disabilities: Secondary | ICD-10-CM | POA: Diagnosis not present

## 2019-04-17 DIAGNOSIS — F6381 Intermittent explosive disorder: Secondary | ICD-10-CM | POA: Diagnosis not present

## 2019-04-17 DIAGNOSIS — F250 Schizoaffective disorder, bipolar type: Secondary | ICD-10-CM | POA: Diagnosis not present

## 2019-05-19 DIAGNOSIS — R0602 Shortness of breath: Secondary | ICD-10-CM | POA: Diagnosis not present

## 2019-05-19 DIAGNOSIS — F259 Schizoaffective disorder, unspecified: Secondary | ICD-10-CM | POA: Diagnosis not present

## 2019-05-19 DIAGNOSIS — F419 Anxiety disorder, unspecified: Secondary | ICD-10-CM | POA: Diagnosis present

## 2019-05-19 DIAGNOSIS — F71 Moderate intellectual disabilities: Secondary | ICD-10-CM | POA: Diagnosis present

## 2019-05-19 DIAGNOSIS — G40909 Epilepsy, unspecified, not intractable, without status epilepticus: Secondary | ICD-10-CM | POA: Diagnosis present

## 2019-05-19 DIAGNOSIS — E785 Hyperlipidemia, unspecified: Secondary | ICD-10-CM | POA: Diagnosis present

## 2019-05-19 DIAGNOSIS — J189 Pneumonia, unspecified organism: Secondary | ICD-10-CM | POA: Diagnosis not present

## 2019-05-19 DIAGNOSIS — Z20822 Contact with and (suspected) exposure to covid-19: Secondary | ICD-10-CM | POA: Diagnosis present

## 2019-05-19 DIAGNOSIS — K219 Gastro-esophageal reflux disease without esophagitis: Secondary | ICD-10-CM | POA: Diagnosis present

## 2019-05-19 DIAGNOSIS — R509 Fever, unspecified: Secondary | ICD-10-CM | POA: Diagnosis not present

## 2019-05-19 DIAGNOSIS — R0902 Hypoxemia: Secondary | ICD-10-CM | POA: Diagnosis not present

## 2019-05-19 DIAGNOSIS — I10 Essential (primary) hypertension: Secondary | ICD-10-CM | POA: Diagnosis not present

## 2019-05-19 DIAGNOSIS — A419 Sepsis, unspecified organism: Secondary | ICD-10-CM | POA: Diagnosis not present

## 2019-05-19 DIAGNOSIS — Z882 Allergy status to sulfonamides status: Secondary | ICD-10-CM | POA: Diagnosis not present

## 2019-05-19 DIAGNOSIS — R569 Unspecified convulsions: Secondary | ICD-10-CM | POA: Diagnosis not present

## 2019-05-27 DIAGNOSIS — I10 Essential (primary) hypertension: Secondary | ICD-10-CM | POA: Diagnosis not present

## 2019-05-27 DIAGNOSIS — J189 Pneumonia, unspecified organism: Secondary | ICD-10-CM | POA: Diagnosis not present

## 2019-05-27 DIAGNOSIS — Z6826 Body mass index (BMI) 26.0-26.9, adult: Secondary | ICD-10-CM | POA: Diagnosis not present

## 2019-05-27 DIAGNOSIS — G40909 Epilepsy, unspecified, not intractable, without status epilepticus: Secondary | ICD-10-CM | POA: Diagnosis not present

## 2019-05-27 DIAGNOSIS — R319 Hematuria, unspecified: Secondary | ICD-10-CM | POA: Diagnosis not present

## 2019-05-27 DIAGNOSIS — Z299 Encounter for prophylactic measures, unspecified: Secondary | ICD-10-CM | POA: Diagnosis not present

## 2019-05-27 DIAGNOSIS — Z09 Encounter for follow-up examination after completed treatment for conditions other than malignant neoplasm: Secondary | ICD-10-CM | POA: Diagnosis not present

## 2019-09-03 DIAGNOSIS — F250 Schizoaffective disorder, bipolar type: Secondary | ICD-10-CM | POA: Diagnosis not present

## 2019-09-03 DIAGNOSIS — F71 Moderate intellectual disabilities: Secondary | ICD-10-CM | POA: Diagnosis not present

## 2019-09-03 DIAGNOSIS — F6381 Intermittent explosive disorder: Secondary | ICD-10-CM | POA: Diagnosis not present

## 2019-10-22 DIAGNOSIS — H53023 Refractive amblyopia, bilateral: Secondary | ICD-10-CM | POA: Diagnosis not present

## 2019-10-22 DIAGNOSIS — H11133 Conjunctival pigmentations, bilateral: Secondary | ICD-10-CM | POA: Diagnosis not present

## 2019-10-22 DIAGNOSIS — H2513 Age-related nuclear cataract, bilateral: Secondary | ICD-10-CM | POA: Diagnosis not present

## 2019-11-19 DIAGNOSIS — I10 Essential (primary) hypertension: Secondary | ICD-10-CM | POA: Diagnosis not present

## 2019-11-19 DIAGNOSIS — F259 Schizoaffective disorder, unspecified: Secondary | ICD-10-CM | POA: Diagnosis not present

## 2019-11-19 DIAGNOSIS — J069 Acute upper respiratory infection, unspecified: Secondary | ICD-10-CM | POA: Diagnosis not present

## 2019-11-19 DIAGNOSIS — F419 Anxiety disorder, unspecified: Secondary | ICD-10-CM | POA: Diagnosis not present

## 2019-11-19 DIAGNOSIS — Z299 Encounter for prophylactic measures, unspecified: Secondary | ICD-10-CM | POA: Diagnosis not present

## 2019-12-10 DIAGNOSIS — G40909 Epilepsy, unspecified, not intractable, without status epilepticus: Secondary | ICD-10-CM | POA: Diagnosis not present

## 2019-12-10 DIAGNOSIS — F419 Anxiety disorder, unspecified: Secondary | ICD-10-CM | POA: Diagnosis not present

## 2019-12-10 DIAGNOSIS — J069 Acute upper respiratory infection, unspecified: Secondary | ICD-10-CM | POA: Diagnosis not present

## 2019-12-10 DIAGNOSIS — Z299 Encounter for prophylactic measures, unspecified: Secondary | ICD-10-CM | POA: Diagnosis not present

## 2019-12-10 DIAGNOSIS — F909 Attention-deficit hyperactivity disorder, unspecified type: Secondary | ICD-10-CM | POA: Diagnosis not present

## 2019-12-24 DIAGNOSIS — F250 Schizoaffective disorder, bipolar type: Secondary | ICD-10-CM | POA: Diagnosis not present

## 2019-12-24 DIAGNOSIS — F6381 Intermittent explosive disorder: Secondary | ICD-10-CM | POA: Diagnosis not present

## 2019-12-24 DIAGNOSIS — F71 Moderate intellectual disabilities: Secondary | ICD-10-CM | POA: Diagnosis not present

## 2020-01-09 DIAGNOSIS — R05 Cough: Secondary | ICD-10-CM | POA: Diagnosis not present

## 2020-01-09 DIAGNOSIS — G40909 Epilepsy, unspecified, not intractable, without status epilepticus: Secondary | ICD-10-CM | POA: Diagnosis not present

## 2020-01-09 DIAGNOSIS — F259 Schizoaffective disorder, unspecified: Secondary | ICD-10-CM | POA: Diagnosis not present

## 2020-01-09 DIAGNOSIS — J90 Pleural effusion, not elsewhere classified: Secondary | ICD-10-CM | POA: Diagnosis not present

## 2020-01-09 DIAGNOSIS — I10 Essential (primary) hypertension: Secondary | ICD-10-CM | POA: Diagnosis not present

## 2020-01-09 DIAGNOSIS — J9811 Atelectasis: Secondary | ICD-10-CM | POA: Diagnosis not present

## 2020-01-09 DIAGNOSIS — Z299 Encounter for prophylactic measures, unspecified: Secondary | ICD-10-CM | POA: Diagnosis not present

## 2020-01-30 DIAGNOSIS — I517 Cardiomegaly: Secondary | ICD-10-CM | POA: Diagnosis not present

## 2020-01-30 DIAGNOSIS — G40909 Epilepsy, unspecified, not intractable, without status epilepticus: Secondary | ICD-10-CM | POA: Diagnosis not present

## 2020-01-30 DIAGNOSIS — Z299 Encounter for prophylactic measures, unspecified: Secondary | ICD-10-CM | POA: Diagnosis not present

## 2020-01-30 DIAGNOSIS — F259 Schizoaffective disorder, unspecified: Secondary | ICD-10-CM | POA: Diagnosis not present

## 2020-01-30 DIAGNOSIS — I10 Essential (primary) hypertension: Secondary | ICD-10-CM | POA: Diagnosis not present

## 2020-01-30 DIAGNOSIS — Z789 Other specified health status: Secondary | ICD-10-CM | POA: Diagnosis not present

## 2020-01-30 DIAGNOSIS — J811 Chronic pulmonary edema: Secondary | ICD-10-CM | POA: Diagnosis not present

## 2020-01-30 DIAGNOSIS — Z6830 Body mass index (BMI) 30.0-30.9, adult: Secondary | ICD-10-CM | POA: Diagnosis not present

## 2020-01-30 DIAGNOSIS — J189 Pneumonia, unspecified organism: Secondary | ICD-10-CM | POA: Diagnosis not present

## 2020-01-30 DIAGNOSIS — J9811 Atelectasis: Secondary | ICD-10-CM | POA: Diagnosis not present

## 2020-02-18 DIAGNOSIS — F6381 Intermittent explosive disorder: Secondary | ICD-10-CM | POA: Diagnosis not present

## 2020-02-18 DIAGNOSIS — F71 Moderate intellectual disabilities: Secondary | ICD-10-CM | POA: Diagnosis not present

## 2020-02-18 DIAGNOSIS — F250 Schizoaffective disorder, bipolar type: Secondary | ICD-10-CM | POA: Diagnosis not present

## 2020-04-22 DIAGNOSIS — F6381 Intermittent explosive disorder: Secondary | ICD-10-CM | POA: Diagnosis not present

## 2020-04-22 DIAGNOSIS — F250 Schizoaffective disorder, bipolar type: Secondary | ICD-10-CM | POA: Diagnosis not present

## 2020-04-22 DIAGNOSIS — F71 Moderate intellectual disabilities: Secondary | ICD-10-CM | POA: Diagnosis not present

## 2020-05-11 DIAGNOSIS — Z789 Other specified health status: Secondary | ICD-10-CM | POA: Diagnosis not present

## 2020-05-11 DIAGNOSIS — Z6830 Body mass index (BMI) 30.0-30.9, adult: Secondary | ICD-10-CM | POA: Diagnosis not present

## 2020-05-11 DIAGNOSIS — F419 Anxiety disorder, unspecified: Secondary | ICD-10-CM | POA: Diagnosis not present

## 2020-05-11 DIAGNOSIS — Z713 Dietary counseling and surveillance: Secondary | ICD-10-CM | POA: Diagnosis not present

## 2020-05-11 DIAGNOSIS — J069 Acute upper respiratory infection, unspecified: Secondary | ICD-10-CM | POA: Diagnosis not present

## 2020-05-11 DIAGNOSIS — F259 Schizoaffective disorder, unspecified: Secondary | ICD-10-CM | POA: Diagnosis not present

## 2020-05-13 DIAGNOSIS — R059 Cough, unspecified: Secondary | ICD-10-CM | POA: Diagnosis not present

## 2020-05-13 DIAGNOSIS — R0602 Shortness of breath: Secondary | ICD-10-CM | POA: Diagnosis not present

## 2020-06-16 DIAGNOSIS — I10 Essential (primary) hypertension: Secondary | ICD-10-CM | POA: Diagnosis not present

## 2020-06-16 DIAGNOSIS — J069 Acute upper respiratory infection, unspecified: Secondary | ICD-10-CM | POA: Diagnosis not present

## 2020-06-16 DIAGNOSIS — F259 Schizoaffective disorder, unspecified: Secondary | ICD-10-CM | POA: Diagnosis not present

## 2020-06-16 DIAGNOSIS — Z299 Encounter for prophylactic measures, unspecified: Secondary | ICD-10-CM | POA: Diagnosis not present

## 2020-06-16 DIAGNOSIS — J45909 Unspecified asthma, uncomplicated: Secondary | ICD-10-CM | POA: Diagnosis not present

## 2020-06-17 DIAGNOSIS — F6381 Intermittent explosive disorder: Secondary | ICD-10-CM | POA: Diagnosis not present

## 2020-06-17 DIAGNOSIS — F71 Moderate intellectual disabilities: Secondary | ICD-10-CM | POA: Diagnosis not present

## 2020-06-17 DIAGNOSIS — F250 Schizoaffective disorder, bipolar type: Secondary | ICD-10-CM | POA: Diagnosis not present

## 2020-08-25 DIAGNOSIS — F250 Schizoaffective disorder, bipolar type: Secondary | ICD-10-CM | POA: Diagnosis not present

## 2020-08-25 DIAGNOSIS — F71 Moderate intellectual disabilities: Secondary | ICD-10-CM | POA: Diagnosis not present

## 2020-08-25 DIAGNOSIS — F6381 Intermittent explosive disorder: Secondary | ICD-10-CM | POA: Diagnosis not present

## 2020-10-08 DIAGNOSIS — Z6829 Body mass index (BMI) 29.0-29.9, adult: Secondary | ICD-10-CM | POA: Diagnosis not present

## 2020-10-08 DIAGNOSIS — R5383 Other fatigue: Secondary | ICD-10-CM | POA: Diagnosis not present

## 2020-10-08 DIAGNOSIS — Z79899 Other long term (current) drug therapy: Secondary | ICD-10-CM | POA: Diagnosis not present

## 2020-10-08 DIAGNOSIS — Z7189 Other specified counseling: Secondary | ICD-10-CM | POA: Diagnosis not present

## 2020-10-08 DIAGNOSIS — Z1331 Encounter for screening for depression: Secondary | ICD-10-CM | POA: Diagnosis not present

## 2020-10-08 DIAGNOSIS — Z299 Encounter for prophylactic measures, unspecified: Secondary | ICD-10-CM | POA: Diagnosis not present

## 2020-10-08 DIAGNOSIS — Z0000 Encounter for general adult medical examination without abnormal findings: Secondary | ICD-10-CM | POA: Diagnosis not present

## 2020-10-08 DIAGNOSIS — D696 Thrombocytopenia, unspecified: Secondary | ICD-10-CM | POA: Diagnosis not present

## 2020-10-08 DIAGNOSIS — E785 Hyperlipidemia, unspecified: Secondary | ICD-10-CM | POA: Diagnosis not present

## 2020-10-08 DIAGNOSIS — Z1339 Encounter for screening examination for other mental health and behavioral disorders: Secondary | ICD-10-CM | POA: Diagnosis not present

## 2020-10-08 DIAGNOSIS — I10 Essential (primary) hypertension: Secondary | ICD-10-CM | POA: Diagnosis not present

## 2020-10-21 DIAGNOSIS — F6381 Intermittent explosive disorder: Secondary | ICD-10-CM | POA: Diagnosis not present

## 2020-10-21 DIAGNOSIS — F71 Moderate intellectual disabilities: Secondary | ICD-10-CM | POA: Diagnosis not present

## 2020-10-21 DIAGNOSIS — F250 Schizoaffective disorder, bipolar type: Secondary | ICD-10-CM | POA: Diagnosis not present

## 2020-12-21 DIAGNOSIS — F71 Moderate intellectual disabilities: Secondary | ICD-10-CM | POA: Diagnosis not present

## 2020-12-21 DIAGNOSIS — F6381 Intermittent explosive disorder: Secondary | ICD-10-CM | POA: Diagnosis not present

## 2020-12-21 DIAGNOSIS — F250 Schizoaffective disorder, bipolar type: Secondary | ICD-10-CM | POA: Diagnosis not present

## 2021-02-19 DIAGNOSIS — F250 Schizoaffective disorder, bipolar type: Secondary | ICD-10-CM | POA: Diagnosis not present

## 2021-02-19 DIAGNOSIS — F71 Moderate intellectual disabilities: Secondary | ICD-10-CM | POA: Diagnosis not present

## 2021-02-19 DIAGNOSIS — F6381 Intermittent explosive disorder: Secondary | ICD-10-CM | POA: Diagnosis not present

## 2021-03-19 DIAGNOSIS — Z23 Encounter for immunization: Secondary | ICD-10-CM | POA: Diagnosis not present

## 2021-03-24 DIAGNOSIS — H11133 Conjunctival pigmentations, bilateral: Secondary | ICD-10-CM | POA: Diagnosis not present

## 2021-03-24 DIAGNOSIS — H53023 Refractive amblyopia, bilateral: Secondary | ICD-10-CM | POA: Diagnosis not present

## 2021-03-24 DIAGNOSIS — H2513 Age-related nuclear cataract, bilateral: Secondary | ICD-10-CM | POA: Diagnosis not present

## 2021-04-20 DIAGNOSIS — F71 Moderate intellectual disabilities: Secondary | ICD-10-CM | POA: Diagnosis not present

## 2021-04-20 DIAGNOSIS — F250 Schizoaffective disorder, bipolar type: Secondary | ICD-10-CM | POA: Diagnosis not present

## 2021-04-20 DIAGNOSIS — F6381 Intermittent explosive disorder: Secondary | ICD-10-CM | POA: Diagnosis not present

## 2021-06-17 DIAGNOSIS — F6381 Intermittent explosive disorder: Secondary | ICD-10-CM | POA: Diagnosis not present

## 2021-06-17 DIAGNOSIS — F250 Schizoaffective disorder, bipolar type: Secondary | ICD-10-CM | POA: Diagnosis not present

## 2021-06-17 DIAGNOSIS — F71 Moderate intellectual disabilities: Secondary | ICD-10-CM | POA: Diagnosis not present

## 2021-08-12 DIAGNOSIS — F250 Schizoaffective disorder, bipolar type: Secondary | ICD-10-CM | POA: Diagnosis not present

## 2021-08-12 DIAGNOSIS — F6381 Intermittent explosive disorder: Secondary | ICD-10-CM | POA: Diagnosis not present

## 2021-08-12 DIAGNOSIS — F71 Moderate intellectual disabilities: Secondary | ICD-10-CM | POA: Diagnosis not present

## 2021-10-11 DIAGNOSIS — F250 Schizoaffective disorder, bipolar type: Secondary | ICD-10-CM | POA: Diagnosis not present

## 2021-10-11 DIAGNOSIS — F6381 Intermittent explosive disorder: Secondary | ICD-10-CM | POA: Diagnosis not present

## 2021-10-11 DIAGNOSIS — F71 Moderate intellectual disabilities: Secondary | ICD-10-CM | POA: Diagnosis not present

## 2021-10-21 DIAGNOSIS — E7800 Pure hypercholesterolemia, unspecified: Secondary | ICD-10-CM | POA: Diagnosis not present

## 2021-10-21 DIAGNOSIS — Z0000 Encounter for general adult medical examination without abnormal findings: Secondary | ICD-10-CM | POA: Diagnosis not present

## 2021-10-21 DIAGNOSIS — Z7189 Other specified counseling: Secondary | ICD-10-CM | POA: Diagnosis not present

## 2021-10-21 DIAGNOSIS — I10 Essential (primary) hypertension: Secondary | ICD-10-CM | POA: Diagnosis not present

## 2021-10-21 DIAGNOSIS — Z789 Other specified health status: Secondary | ICD-10-CM | POA: Diagnosis not present

## 2021-10-21 DIAGNOSIS — Z6828 Body mass index (BMI) 28.0-28.9, adult: Secondary | ICD-10-CM | POA: Diagnosis not present

## 2021-10-21 DIAGNOSIS — Z299 Encounter for prophylactic measures, unspecified: Secondary | ICD-10-CM | POA: Diagnosis not present

## 2021-10-21 DIAGNOSIS — Z79899 Other long term (current) drug therapy: Secondary | ICD-10-CM | POA: Diagnosis not present

## 2021-10-21 DIAGNOSIS — Z1339 Encounter for screening examination for other mental health and behavioral disorders: Secondary | ICD-10-CM | POA: Diagnosis not present

## 2021-10-21 DIAGNOSIS — R5383 Other fatigue: Secondary | ICD-10-CM | POA: Diagnosis not present

## 2021-10-21 DIAGNOSIS — Z1331 Encounter for screening for depression: Secondary | ICD-10-CM | POA: Diagnosis not present

## 2021-11-18 DIAGNOSIS — G40909 Epilepsy, unspecified, not intractable, without status epilepticus: Secondary | ICD-10-CM | POA: Diagnosis not present

## 2021-11-18 DIAGNOSIS — F259 Schizoaffective disorder, unspecified: Secondary | ICD-10-CM | POA: Diagnosis not present

## 2021-11-18 DIAGNOSIS — R1310 Dysphagia, unspecified: Secondary | ICD-10-CM | POA: Diagnosis not present

## 2021-11-18 DIAGNOSIS — Z299 Encounter for prophylactic measures, unspecified: Secondary | ICD-10-CM | POA: Diagnosis not present

## 2021-11-18 DIAGNOSIS — I10 Essential (primary) hypertension: Secondary | ICD-10-CM | POA: Diagnosis not present

## 2021-12-06 DIAGNOSIS — F6381 Intermittent explosive disorder: Secondary | ICD-10-CM | POA: Diagnosis not present

## 2021-12-06 DIAGNOSIS — F250 Schizoaffective disorder, bipolar type: Secondary | ICD-10-CM | POA: Diagnosis not present

## 2021-12-06 DIAGNOSIS — F71 Moderate intellectual disabilities: Secondary | ICD-10-CM | POA: Diagnosis not present

## 2022-02-02 DIAGNOSIS — F250 Schizoaffective disorder, bipolar type: Secondary | ICD-10-CM | POA: Diagnosis not present

## 2022-02-02 DIAGNOSIS — F71 Moderate intellectual disabilities: Secondary | ICD-10-CM | POA: Diagnosis not present

## 2022-02-02 DIAGNOSIS — F6381 Intermittent explosive disorder: Secondary | ICD-10-CM | POA: Diagnosis not present

## 2022-02-10 DIAGNOSIS — F250 Schizoaffective disorder, bipolar type: Secondary | ICD-10-CM | POA: Diagnosis not present

## 2022-02-10 DIAGNOSIS — F6381 Intermittent explosive disorder: Secondary | ICD-10-CM | POA: Diagnosis not present

## 2022-02-10 DIAGNOSIS — E889 Metabolic disorder, unspecified: Secondary | ICD-10-CM | POA: Diagnosis not present

## 2022-02-10 DIAGNOSIS — F71 Moderate intellectual disabilities: Secondary | ICD-10-CM | POA: Diagnosis not present

## 2022-02-24 DIAGNOSIS — Z23 Encounter for immunization: Secondary | ICD-10-CM | POA: Diagnosis not present

## 2022-03-01 DIAGNOSIS — R1310 Dysphagia, unspecified: Secondary | ICD-10-CM | POA: Diagnosis not present

## 2022-03-28 DIAGNOSIS — H527 Unspecified disorder of refraction: Secondary | ICD-10-CM | POA: Diagnosis not present

## 2022-03-28 DIAGNOSIS — H2513 Age-related nuclear cataract, bilateral: Secondary | ICD-10-CM | POA: Diagnosis not present

## 2022-03-28 DIAGNOSIS — H53023 Refractive amblyopia, bilateral: Secondary | ICD-10-CM | POA: Diagnosis not present

## 2022-03-28 DIAGNOSIS — H11133 Conjunctival pigmentations, bilateral: Secondary | ICD-10-CM | POA: Diagnosis not present

## 2022-03-28 DIAGNOSIS — H25042 Posterior subcapsular polar age-related cataract, left eye: Secondary | ICD-10-CM | POA: Diagnosis not present

## 2022-03-29 DIAGNOSIS — F250 Schizoaffective disorder, bipolar type: Secondary | ICD-10-CM | POA: Diagnosis not present

## 2022-03-29 DIAGNOSIS — F6381 Intermittent explosive disorder: Secondary | ICD-10-CM | POA: Diagnosis not present

## 2022-03-29 DIAGNOSIS — F71 Moderate intellectual disabilities: Secondary | ICD-10-CM | POA: Diagnosis not present

## 2022-05-18 DIAGNOSIS — H527 Unspecified disorder of refraction: Secondary | ICD-10-CM | POA: Diagnosis not present

## 2022-05-18 DIAGNOSIS — H2513 Age-related nuclear cataract, bilateral: Secondary | ICD-10-CM | POA: Diagnosis not present

## 2022-05-18 DIAGNOSIS — H53023 Refractive amblyopia, bilateral: Secondary | ICD-10-CM | POA: Diagnosis not present

## 2022-05-18 DIAGNOSIS — H11133 Conjunctival pigmentations, bilateral: Secondary | ICD-10-CM | POA: Diagnosis not present

## 2022-05-18 DIAGNOSIS — H25042 Posterior subcapsular polar age-related cataract, left eye: Secondary | ICD-10-CM | POA: Diagnosis not present

## 2022-05-23 DIAGNOSIS — F6381 Intermittent explosive disorder: Secondary | ICD-10-CM | POA: Diagnosis not present

## 2022-05-23 DIAGNOSIS — F250 Schizoaffective disorder, bipolar type: Secondary | ICD-10-CM | POA: Diagnosis not present

## 2022-05-23 DIAGNOSIS — F71 Moderate intellectual disabilities: Secondary | ICD-10-CM | POA: Diagnosis not present

## 2022-06-02 DIAGNOSIS — Z299 Encounter for prophylactic measures, unspecified: Secondary | ICD-10-CM | POA: Diagnosis not present

## 2022-06-02 DIAGNOSIS — F319 Bipolar disorder, unspecified: Secondary | ICD-10-CM | POA: Diagnosis not present

## 2022-06-02 DIAGNOSIS — G40909 Epilepsy, unspecified, not intractable, without status epilepticus: Secondary | ICD-10-CM | POA: Diagnosis not present

## 2022-06-02 DIAGNOSIS — I10 Essential (primary) hypertension: Secondary | ICD-10-CM | POA: Diagnosis not present

## 2022-06-02 DIAGNOSIS — F259 Schizoaffective disorder, unspecified: Secondary | ICD-10-CM | POA: Diagnosis not present

## 2022-06-21 DIAGNOSIS — H25042 Posterior subcapsular polar age-related cataract, left eye: Secondary | ICD-10-CM | POA: Diagnosis not present

## 2022-06-21 DIAGNOSIS — H268 Other specified cataract: Secondary | ICD-10-CM | POA: Diagnosis not present

## 2022-08-26 DIAGNOSIS — H2511 Age-related nuclear cataract, right eye: Secondary | ICD-10-CM | POA: Diagnosis not present

## 2022-08-30 DIAGNOSIS — H2511 Age-related nuclear cataract, right eye: Secondary | ICD-10-CM | POA: Diagnosis not present

## 2022-10-18 DIAGNOSIS — F250 Schizoaffective disorder, bipolar type: Secondary | ICD-10-CM | POA: Diagnosis not present

## 2022-10-18 DIAGNOSIS — F6381 Intermittent explosive disorder: Secondary | ICD-10-CM | POA: Diagnosis not present

## 2022-10-18 DIAGNOSIS — F71 Moderate intellectual disabilities: Secondary | ICD-10-CM | POA: Diagnosis not present

## 2022-10-28 DIAGNOSIS — Z79899 Other long term (current) drug therapy: Secondary | ICD-10-CM | POA: Diagnosis not present

## 2022-10-28 DIAGNOSIS — E7800 Pure hypercholesterolemia, unspecified: Secondary | ICD-10-CM | POA: Diagnosis not present

## 2022-10-28 DIAGNOSIS — Z0000 Encounter for general adult medical examination without abnormal findings: Secondary | ICD-10-CM | POA: Diagnosis not present

## 2022-10-28 DIAGNOSIS — I10 Essential (primary) hypertension: Secondary | ICD-10-CM | POA: Diagnosis not present

## 2022-10-28 DIAGNOSIS — Z299 Encounter for prophylactic measures, unspecified: Secondary | ICD-10-CM | POA: Diagnosis not present

## 2022-10-28 DIAGNOSIS — Z7189 Other specified counseling: Secondary | ICD-10-CM | POA: Diagnosis not present

## 2022-10-28 DIAGNOSIS — R5383 Other fatigue: Secondary | ICD-10-CM | POA: Diagnosis not present

## 2022-10-28 DIAGNOSIS — Z1339 Encounter for screening examination for other mental health and behavioral disorders: Secondary | ICD-10-CM | POA: Diagnosis not present

## 2022-10-28 DIAGNOSIS — Z1331 Encounter for screening for depression: Secondary | ICD-10-CM | POA: Diagnosis not present

## 2022-11-08 DIAGNOSIS — E7800 Pure hypercholesterolemia, unspecified: Secondary | ICD-10-CM | POA: Diagnosis not present

## 2022-11-08 DIAGNOSIS — R5383 Other fatigue: Secondary | ICD-10-CM | POA: Diagnosis not present

## 2022-11-08 DIAGNOSIS — Z79899 Other long term (current) drug therapy: Secondary | ICD-10-CM | POA: Diagnosis not present

## 2023-01-12 DIAGNOSIS — F250 Schizoaffective disorder, bipolar type: Secondary | ICD-10-CM | POA: Diagnosis not present

## 2023-01-12 DIAGNOSIS — F6381 Intermittent explosive disorder: Secondary | ICD-10-CM | POA: Diagnosis not present

## 2023-01-12 DIAGNOSIS — F71 Moderate intellectual disabilities: Secondary | ICD-10-CM | POA: Diagnosis not present

## 2023-01-30 DIAGNOSIS — Z23 Encounter for immunization: Secondary | ICD-10-CM | POA: Diagnosis not present

## 2023-02-23 DIAGNOSIS — Z5181 Encounter for therapeutic drug level monitoring: Secondary | ICD-10-CM | POA: Diagnosis not present

## 2023-02-23 DIAGNOSIS — D72829 Elevated white blood cell count, unspecified: Secondary | ICD-10-CM | POA: Diagnosis not present

## 2023-04-05 DIAGNOSIS — I10 Essential (primary) hypertension: Secondary | ICD-10-CM | POA: Diagnosis not present

## 2023-04-05 DIAGNOSIS — D696 Thrombocytopenia, unspecified: Secondary | ICD-10-CM | POA: Diagnosis not present

## 2023-04-05 DIAGNOSIS — Z5181 Encounter for therapeutic drug level monitoring: Secondary | ICD-10-CM | POA: Diagnosis not present

## 2023-04-05 DIAGNOSIS — Z299 Encounter for prophylactic measures, unspecified: Secondary | ICD-10-CM | POA: Diagnosis not present

## 2023-04-10 DIAGNOSIS — D696 Thrombocytopenia, unspecified: Secondary | ICD-10-CM | POA: Diagnosis not present

## 2023-04-10 DIAGNOSIS — Z5181 Encounter for therapeutic drug level monitoring: Secondary | ICD-10-CM | POA: Diagnosis not present

## 2023-04-12 DIAGNOSIS — F250 Schizoaffective disorder, bipolar type: Secondary | ICD-10-CM | POA: Diagnosis not present

## 2023-04-12 DIAGNOSIS — F71 Moderate intellectual disabilities: Secondary | ICD-10-CM | POA: Diagnosis not present

## 2023-04-12 DIAGNOSIS — F6381 Intermittent explosive disorder: Secondary | ICD-10-CM | POA: Diagnosis not present

## 2023-07-10 DIAGNOSIS — F71 Moderate intellectual disabilities: Secondary | ICD-10-CM | POA: Diagnosis not present

## 2023-07-10 DIAGNOSIS — F250 Schizoaffective disorder, bipolar type: Secondary | ICD-10-CM | POA: Diagnosis not present

## 2023-07-10 DIAGNOSIS — F6381 Intermittent explosive disorder: Secondary | ICD-10-CM | POA: Diagnosis not present

## 2023-07-21 DIAGNOSIS — R569 Unspecified convulsions: Secondary | ICD-10-CM | POA: Diagnosis not present

## 2023-07-21 DIAGNOSIS — E782 Mixed hyperlipidemia: Secondary | ICD-10-CM | POA: Diagnosis not present

## 2023-07-21 DIAGNOSIS — K2100 Gastro-esophageal reflux disease with esophagitis, without bleeding: Secondary | ICD-10-CM | POA: Diagnosis not present

## 2023-07-21 DIAGNOSIS — Z1212 Encounter for screening for malignant neoplasm of rectum: Secondary | ICD-10-CM | POA: Diagnosis not present

## 2023-07-21 DIAGNOSIS — Z7289 Other problems related to lifestyle: Secondary | ICD-10-CM | POA: Diagnosis not present

## 2023-07-21 DIAGNOSIS — F5104 Psychophysiologic insomnia: Secondary | ICD-10-CM | POA: Diagnosis not present

## 2023-07-21 DIAGNOSIS — Z125 Encounter for screening for malignant neoplasm of prostate: Secondary | ICD-10-CM | POA: Diagnosis not present

## 2023-07-21 DIAGNOSIS — Z1211 Encounter for screening for malignant neoplasm of colon: Secondary | ICD-10-CM | POA: Diagnosis not present

## 2023-07-21 DIAGNOSIS — F319 Bipolar disorder, unspecified: Secondary | ICD-10-CM | POA: Diagnosis not present

## 2023-07-21 DIAGNOSIS — Z131 Encounter for screening for diabetes mellitus: Secondary | ICD-10-CM | POA: Diagnosis not present

## 2023-07-21 DIAGNOSIS — Z1159 Encounter for screening for other viral diseases: Secondary | ICD-10-CM | POA: Diagnosis not present

## 2023-07-27 DIAGNOSIS — Z1159 Encounter for screening for other viral diseases: Secondary | ICD-10-CM | POA: Diagnosis not present

## 2023-07-27 DIAGNOSIS — Z131 Encounter for screening for diabetes mellitus: Secondary | ICD-10-CM | POA: Diagnosis not present

## 2023-07-27 DIAGNOSIS — E782 Mixed hyperlipidemia: Secondary | ICD-10-CM | POA: Diagnosis not present

## 2023-07-27 DIAGNOSIS — R569 Unspecified convulsions: Secondary | ICD-10-CM | POA: Diagnosis not present

## 2023-07-27 DIAGNOSIS — Z7289 Other problems related to lifestyle: Secondary | ICD-10-CM | POA: Diagnosis not present

## 2023-07-28 DIAGNOSIS — F259 Schizoaffective disorder, unspecified: Secondary | ICD-10-CM | POA: Diagnosis not present

## 2023-07-28 DIAGNOSIS — R051 Acute cough: Secondary | ICD-10-CM | POA: Diagnosis not present

## 2023-07-28 DIAGNOSIS — R059 Cough, unspecified: Secondary | ICD-10-CM | POA: Diagnosis not present

## 2023-07-28 DIAGNOSIS — F319 Bipolar disorder, unspecified: Secondary | ICD-10-CM | POA: Diagnosis not present

## 2023-07-28 DIAGNOSIS — Z79899 Other long term (current) drug therapy: Secondary | ICD-10-CM | POA: Diagnosis not present

## 2023-07-28 DIAGNOSIS — R0789 Other chest pain: Secondary | ICD-10-CM | POA: Diagnosis not present

## 2023-07-28 DIAGNOSIS — J45909 Unspecified asthma, uncomplicated: Secondary | ICD-10-CM | POA: Diagnosis not present

## 2023-07-28 DIAGNOSIS — E785 Hyperlipidemia, unspecified: Secondary | ICD-10-CM | POA: Diagnosis not present

## 2023-07-28 DIAGNOSIS — R109 Unspecified abdominal pain: Secondary | ICD-10-CM | POA: Diagnosis not present

## 2023-08-31 DIAGNOSIS — F6381 Intermittent explosive disorder: Secondary | ICD-10-CM | POA: Diagnosis not present

## 2023-08-31 DIAGNOSIS — F71 Moderate intellectual disabilities: Secondary | ICD-10-CM | POA: Diagnosis not present

## 2023-08-31 DIAGNOSIS — F250 Schizoaffective disorder, bipolar type: Secondary | ICD-10-CM | POA: Diagnosis not present

## 2023-09-01 ENCOUNTER — Ambulatory Visit: Payer: Self-pay | Admitting: Family Medicine

## 2023-09-05 ENCOUNTER — Encounter: Payer: Self-pay | Admitting: Family Medicine

## 2023-09-20 DIAGNOSIS — Z1211 Encounter for screening for malignant neoplasm of colon: Secondary | ICD-10-CM | POA: Diagnosis not present

## 2023-09-20 DIAGNOSIS — Z1212 Encounter for screening for malignant neoplasm of rectum: Secondary | ICD-10-CM | POA: Diagnosis not present

## 2023-10-09 DIAGNOSIS — Z961 Presence of intraocular lens: Secondary | ICD-10-CM | POA: Diagnosis not present

## 2023-10-09 DIAGNOSIS — H11133 Conjunctival pigmentations, bilateral: Secondary | ICD-10-CM | POA: Diagnosis not present

## 2023-10-19 DIAGNOSIS — K6289 Other specified diseases of anus and rectum: Secondary | ICD-10-CM | POA: Diagnosis not present

## 2023-10-19 DIAGNOSIS — G40909 Epilepsy, unspecified, not intractable, without status epilepticus: Secondary | ICD-10-CM | POA: Diagnosis not present

## 2023-10-19 DIAGNOSIS — J45909 Unspecified asthma, uncomplicated: Secondary | ICD-10-CM | POA: Diagnosis not present

## 2023-10-19 DIAGNOSIS — Z1211 Encounter for screening for malignant neoplasm of colon: Secondary | ICD-10-CM | POA: Diagnosis not present

## 2023-10-19 DIAGNOSIS — N429 Disorder of prostate, unspecified: Secondary | ICD-10-CM | POA: Diagnosis not present

## 2023-10-19 DIAGNOSIS — F259 Schizoaffective disorder, unspecified: Secondary | ICD-10-CM | POA: Diagnosis not present

## 2023-10-19 DIAGNOSIS — K219 Gastro-esophageal reflux disease without esophagitis: Secondary | ICD-10-CM | POA: Diagnosis not present

## 2023-10-19 DIAGNOSIS — K621 Rectal polyp: Secondary | ICD-10-CM | POA: Diagnosis not present

## 2023-10-19 DIAGNOSIS — Z79899 Other long term (current) drug therapy: Secondary | ICD-10-CM | POA: Diagnosis not present

## 2023-10-19 DIAGNOSIS — F319 Bipolar disorder, unspecified: Secondary | ICD-10-CM | POA: Diagnosis not present

## 2023-10-19 DIAGNOSIS — E785 Hyperlipidemia, unspecified: Secondary | ICD-10-CM | POA: Diagnosis not present

## 2023-10-19 DIAGNOSIS — K2100 Gastro-esophageal reflux disease with esophagitis, without bleeding: Secondary | ICD-10-CM | POA: Diagnosis not present

## 2023-10-19 DIAGNOSIS — F6381 Intermittent explosive disorder: Secondary | ICD-10-CM | POA: Diagnosis not present

## 2023-10-25 DIAGNOSIS — F71 Moderate intellectual disabilities: Secondary | ICD-10-CM | POA: Diagnosis not present

## 2023-10-25 DIAGNOSIS — F250 Schizoaffective disorder, bipolar type: Secondary | ICD-10-CM | POA: Diagnosis not present

## 2023-10-25 DIAGNOSIS — F6381 Intermittent explosive disorder: Secondary | ICD-10-CM | POA: Diagnosis not present

## 2023-11-15 DIAGNOSIS — Z1211 Encounter for screening for malignant neoplasm of colon: Secondary | ICD-10-CM | POA: Diagnosis not present

## 2023-11-15 DIAGNOSIS — Z1212 Encounter for screening for malignant neoplasm of rectum: Secondary | ICD-10-CM | POA: Diagnosis not present

## 2023-11-24 DIAGNOSIS — F5104 Psychophysiologic insomnia: Secondary | ICD-10-CM | POA: Diagnosis not present

## 2023-11-24 DIAGNOSIS — Z9189 Other specified personal risk factors, not elsewhere classified: Secondary | ICD-10-CM | POA: Diagnosis not present

## 2023-11-24 DIAGNOSIS — K2100 Gastro-esophageal reflux disease with esophagitis, without bleeding: Secondary | ICD-10-CM | POA: Diagnosis not present

## 2023-11-24 DIAGNOSIS — F319 Bipolar disorder, unspecified: Secondary | ICD-10-CM | POA: Diagnosis not present

## 2023-12-18 DIAGNOSIS — F6381 Intermittent explosive disorder: Secondary | ICD-10-CM | POA: Diagnosis not present

## 2023-12-18 DIAGNOSIS — F250 Schizoaffective disorder, bipolar type: Secondary | ICD-10-CM | POA: Diagnosis not present

## 2023-12-18 DIAGNOSIS — F71 Moderate intellectual disabilities: Secondary | ICD-10-CM | POA: Diagnosis not present

## 2024-02-08 DIAGNOSIS — F71 Moderate intellectual disabilities: Secondary | ICD-10-CM | POA: Diagnosis not present

## 2024-02-08 DIAGNOSIS — F250 Schizoaffective disorder, bipolar type: Secondary | ICD-10-CM | POA: Diagnosis not present

## 2024-02-08 DIAGNOSIS — F6381 Intermittent explosive disorder: Secondary | ICD-10-CM | POA: Diagnosis not present

## 2024-02-12 DIAGNOSIS — Z23 Encounter for immunization: Secondary | ICD-10-CM | POA: Diagnosis not present

## 2024-04-03 DIAGNOSIS — F6381 Intermittent explosive disorder: Secondary | ICD-10-CM | POA: Diagnosis not present

## 2024-04-03 DIAGNOSIS — F71 Moderate intellectual disabilities: Secondary | ICD-10-CM | POA: Diagnosis not present

## 2024-04-03 DIAGNOSIS — F250 Schizoaffective disorder, bipolar type: Secondary | ICD-10-CM | POA: Diagnosis not present
